# Patient Record
Sex: Male | Born: 1960 | Race: Black or African American | Hispanic: No | Marital: Married | Smoking: Current every day smoker
Health system: Southern US, Community
[De-identification: ages and names within clinical notes are randomized; demographics above are authoritative.]

## PROBLEM LIST (undated history)

## (undated) DIAGNOSIS — K648 Other hemorrhoids: Secondary | ICD-10-CM

## (undated) DIAGNOSIS — I10 Essential (primary) hypertension: Secondary | ICD-10-CM

## (undated) DIAGNOSIS — M4800 Spinal stenosis, site unspecified: Secondary | ICD-10-CM

## (undated) DIAGNOSIS — M542 Cervicalgia: Secondary | ICD-10-CM

## (undated) DIAGNOSIS — D72819 Decreased white blood cell count, unspecified: Secondary | ICD-10-CM

## (undated) DIAGNOSIS — K5790 Diverticulosis of intestine, part unspecified, without perforation or abscess without bleeding: Secondary | ICD-10-CM

## (undated) DIAGNOSIS — N400 Enlarged prostate without lower urinary tract symptoms: Secondary | ICD-10-CM

## (undated) DIAGNOSIS — H269 Unspecified cataract: Secondary | ICD-10-CM

## (undated) DIAGNOSIS — I4891 Unspecified atrial fibrillation: Secondary | ICD-10-CM

## (undated) DIAGNOSIS — K760 Fatty (change of) liver, not elsewhere classified: Secondary | ICD-10-CM

## (undated) DIAGNOSIS — K635 Polyp of colon: Secondary | ICD-10-CM

## (undated) DIAGNOSIS — I499 Cardiac arrhythmia, unspecified: Secondary | ICD-10-CM

## (undated) HISTORY — DX: Cardiac arrhythmia, unspecified: I49.9

## (undated) HISTORY — DX: Polyp of colon: K63.5

## (undated) HISTORY — DX: Unspecified atrial fibrillation: I48.91

## (undated) HISTORY — DX: Diverticulosis of intestine, part unspecified, without perforation or abscess without bleeding: K57.90

## (undated) HISTORY — DX: Decreased white blood cell count, unspecified: D72.819

## (undated) HISTORY — DX: Cervicalgia: M54.2

## (undated) HISTORY — PX: EYE SURGERY: SHX253

## (undated) HISTORY — DX: Benign prostatic hyperplasia without lower urinary tract symptoms: N40.0

## (undated) HISTORY — PX: OTHER SURGICAL HISTORY: SHX169

## (undated) HISTORY — DX: Spinal stenosis, site unspecified: M48.00

## (undated) HISTORY — DX: Unspecified cataract: H26.9

## (undated) HISTORY — DX: Fatty (change of) liver, not elsewhere classified: K76.0

## (undated) HISTORY — DX: Other hemorrhoids: K64.8

## (undated) HISTORY — DX: Essential (primary) hypertension: I10

---

## 1998-06-14 ENCOUNTER — Emergency Department (HOSPITAL_COMMUNITY): Admission: EM | Admit: 1998-06-14 | Discharge: 1998-06-14 | Payer: Self-pay | Admitting: Emergency Medicine

## 1999-12-10 ENCOUNTER — Emergency Department (HOSPITAL_COMMUNITY): Admission: EM | Admit: 1999-12-10 | Discharge: 1999-12-10 | Payer: Self-pay | Admitting: Emergency Medicine

## 1999-12-10 ENCOUNTER — Encounter: Payer: Self-pay | Admitting: Emergency Medicine

## 2000-07-12 ENCOUNTER — Emergency Department (HOSPITAL_COMMUNITY): Admission: EM | Admit: 2000-07-12 | Discharge: 2000-07-12 | Payer: Self-pay | Admitting: Emergency Medicine

## 2002-01-29 ENCOUNTER — Emergency Department (HOSPITAL_COMMUNITY): Admission: EM | Admit: 2002-01-29 | Discharge: 2002-01-29 | Payer: Self-pay | Admitting: Emergency Medicine

## 2002-02-04 ENCOUNTER — Emergency Department (HOSPITAL_COMMUNITY): Admission: EM | Admit: 2002-02-04 | Discharge: 2002-02-04 | Payer: Self-pay | Admitting: Emergency Medicine

## 2004-08-20 ENCOUNTER — Inpatient Hospital Stay (HOSPITAL_COMMUNITY): Admission: EM | Admit: 2004-08-20 | Discharge: 2004-08-21 | Payer: Self-pay | Admitting: *Deleted

## 2005-02-16 ENCOUNTER — Emergency Department (HOSPITAL_COMMUNITY): Admission: EM | Admit: 2005-02-16 | Discharge: 2005-02-16 | Payer: Self-pay | Admitting: Emergency Medicine

## 2005-07-04 ENCOUNTER — Emergency Department (HOSPITAL_COMMUNITY): Admission: EM | Admit: 2005-07-04 | Discharge: 2005-07-04 | Payer: Self-pay | Admitting: *Deleted

## 2005-07-06 ENCOUNTER — Emergency Department (HOSPITAL_COMMUNITY): Admission: EM | Admit: 2005-07-06 | Discharge: 2005-07-06 | Payer: Self-pay | Admitting: Family Medicine

## 2006-05-01 ENCOUNTER — Emergency Department (HOSPITAL_COMMUNITY): Admission: EM | Admit: 2006-05-01 | Discharge: 2006-05-02 | Payer: Self-pay | Admitting: Emergency Medicine

## 2006-05-02 ENCOUNTER — Emergency Department (HOSPITAL_COMMUNITY): Admission: EM | Admit: 2006-05-02 | Discharge: 2006-05-02 | Payer: Self-pay | Admitting: Emergency Medicine

## 2008-12-20 ENCOUNTER — Emergency Department (HOSPITAL_COMMUNITY): Admission: EM | Admit: 2008-12-20 | Discharge: 2008-12-20 | Payer: Self-pay | Admitting: Emergency Medicine

## 2010-02-20 ENCOUNTER — Emergency Department (HOSPITAL_COMMUNITY): Admission: EM | Admit: 2010-02-20 | Discharge: 2010-02-20 | Payer: Self-pay | Admitting: Emergency Medicine

## 2010-02-28 ENCOUNTER — Encounter: Admission: RE | Admit: 2010-02-28 | Discharge: 2010-02-28 | Payer: Self-pay | Admitting: Chiropractic Medicine

## 2010-06-01 ENCOUNTER — Emergency Department (HOSPITAL_COMMUNITY)
Admission: AC | Admit: 2010-06-01 | Discharge: 2010-06-02 | Payer: Self-pay | Source: Home / Self Care | Admitting: Emergency Medicine

## 2010-07-10 ENCOUNTER — Ambulatory Visit: Payer: Self-pay | Admitting: Internal Medicine

## 2010-07-10 ENCOUNTER — Observation Stay (HOSPITAL_COMMUNITY): Admission: EM | Admit: 2010-07-10 | Discharge: 2010-07-11 | Payer: Self-pay | Admitting: Emergency Medicine

## 2010-07-11 ENCOUNTER — Encounter: Payer: Self-pay | Admitting: Internal Medicine

## 2010-07-12 ENCOUNTER — Encounter: Payer: Self-pay | Admitting: Internal Medicine

## 2010-10-30 NOTE — Letter (Signed)
Summary: Patient Notice- Polyp Results  [facility]  [address] [place], [place] [address]   Phone: [phone]  Fax: [phone]        July 12, 2010 MRN: [id number]    Gene Anderson  , [place]      Dear [name],  I am pleased to inform you that the colon polyp(s) removed during your recent colonoscopy was (were) found to be benign (no cancer detected) upon pathologic examination.Your polyp was hyperplastic ( not precancerous)  I recommend you have a repeat colonoscopy examination in 10_ years to look for recurrent polyps, as having colon polyps increases your risk for having recurrent polyps or even colon cancer in the future.  Should you develop new or worsening symptoms of abdominal pain, bowel habit changes or bleeding from the rectum or bowels, please schedule an evaluation with either your primary care physician or with me.  Additional information/recommendations:  _x_ No further action with gastroenterology is needed at this time. Please      follow-up with your primary care physician for your other healthcare      needs.  __ Please call [phone] to schedule a return visit to review your      situation.  __ Please keep your follow-up visit as already scheduled.  __ Continue treatment plan as outlined the day of your exam.  Please call us if you are having persistent problems or have questions about your condition that have not been fully answered at this time.  Sincerely,  Hart Carwin MD  This letter has been electronically signed by your physician.  Appended Document: Patient Notice- Polyp Results Letter mailed to patient.

## 2010-10-30 NOTE — Procedures (Signed)
Summary: Colonoscopy  Patient: Gene Anderson Note: All result statuses are Final unless otherwise noted.  Tests: (1) Colonoscopy (COL)   COL Colonoscopy           DONE     [facility]     [address] [place], [place]  [address]           COLONOSCOPY PROCEDURE REPORT           PATIENT:  Gene Anderson, Gene Anderson  MR#:  [id number]     BIRTHDATE:  03/31/1961, 49 yrs. old  GENDER:  male     ENDOSCOPIST:  Hedwig Morton. Juanda Chance, MD     REF. BY:     PROCEDURE DATE:  07/11/2010     PROCEDURE:  Colonoscopy 33295     ASA CLASS:  Class I     INDICATIONS:  hematochezia pt came to ER with 2 episodes of     painless rectal bleeding. No prior GI history     MEDICATIONS:   Versed 5 mg, Fentanyl 75 mcg           DESCRIPTION OF PROCEDURE:   After the risks benefits and     alternatives of the procedure were thoroughly explained, informed     consent was obtained.  Digital rectal exam was performed and     revealed no rectal masses.   The Pentax Peds Colonoscope [id number]     endoscope was introduced through the anus and advanced to the     cecum, which was identified by both the appendix and ileocecal     valve, without limitations.  The quality of the prep was good,     using MoviPrep.  The instrument was then slowly withdrawn as the     colon was fully examined.     <<PROCEDUREIMAGES>>           FINDINGS:  Internal hemorrhoids were found (see image12, image11,     image10, and image9). bleeding at the dentate line from     hypervascular area  A sessile polyp was found. at 30 cm, 3 mm     sessile polyp The polyp was removed using cold biopsy forceps (see     image2 and image1).  Mild diverticulosis was found (see image7,     image4, and image3). predominanetly right colon diverticuli, only     few left colon diverticuli  This was otherwise a normal     examination of the colon (see image4, image5, and image6).     Retroflexion was not performed.  The scope was then withdrawn from     the  patient and the procedure completed.           COMPLICATIONS:  None     ENDOSCOPIC IMPRESSION:     1) Internal hemorrhoids     2) Sessile polyp     3) Mild diverticulosis     4) Otherwise normal examination     anal source of bleeding, low volume,     no blood in the colon     RECOMMENDATIONS:     1) Await pathology results     Anusol HC supp 1 hAnalpram cream 2.5% apply bid to rectum x 1     week then prn     REPEAT EXAM:  In 5 - 7 year(s) for.  recall 5-7 years depending on     polyp histology           ______________________________  Hedwig Morton. Juanda Chance, MD           CC:           n.     eSIGNED:   Hedwig Morton. Brodie at 07/11/2010 11:16 AM           Yolande Jolly, [id number]  Note: An exclamation mark (!) indicates a result that was not dispersed into the flowsheet. Document Creation Date: 07/11/2010 12:09 PM _______________________________________________________________________  (1) Order result status: Final Collection or observation date-time: 07/11/2010 11:07 Requested date-time:  Receipt date-time:  Reported date-time:  Referring Physician:   Ordering Physician: Lina Sar [phone]) Specimen Source:  Source: Launa Grill Order Number: [id number] Lab site:

## 2010-12-12 LAB — CBC
Hemoglobin: 12.9 g/dL — ABNORMAL LOW (ref 13.0–17.0)
MCH: 32.3 pg (ref 26.0–34.0)
MCH: 32.3 pg (ref 26.0–34.0)
MCV: 92.3 fL (ref 78.0–100.0)
Platelets: 201 10*3/uL (ref 150–400)
Platelets: 230 10*3/uL (ref 150–400)
RBC: 3.98 MIL/uL — ABNORMAL LOW (ref 4.22–5.81)
RBC: 4.4 MIL/uL (ref 4.22–5.81)
RDW: 13.3 % (ref 11.5–15.5)

## 2010-12-12 LAB — COMPREHENSIVE METABOLIC PANEL
ALT: 22 U/L (ref 0–53)
BUN: 13 mg/dL (ref 6–23)
CO2: 31 mEq/L (ref 19–32)
Calcium: 9.4 mg/dL (ref 8.4–10.5)
Creatinine, Ser: 0.83 mg/dL (ref 0.4–1.5)
GFR calc non Af Amer: >60 mL/min (ref >60)
Glucose, Bld: 94 mg/dL (ref 70–99)
Sodium: 139 mEq/L (ref 135–145)
Total Protein: 7.9 g/dL (ref 6.0–8.3)

## 2010-12-12 LAB — DIFFERENTIAL
Basophils Absolute: 0 10*3/uL (ref 0.0–0.1)
Basophils Relative: 1 % (ref 0–1)
Eosinophils Absolute: 0.1 10*3/uL (ref 0.0–0.7)
Eosinophils Relative: 2 % (ref 0–5)
Lymphs Abs: 2.1 10*3/uL (ref 0.7–4.0)
Neutrophils Relative %: 36 % — ABNORMAL LOW (ref 43–77)

## 2010-12-12 LAB — HEMOCCULT GUIAC POC 1CARD (OFFICE): Fecal Occult Bld: POSITIVE

## 2010-12-12 LAB — HEMOGLOBIN AND HEMATOCRIT, BLOOD
HCT: 39.2 % (ref 39.0–52.0)
HCT: 40.6 % (ref 39.0–52.0)
Hemoglobin: 14.1 g/dL (ref 13.0–17.0)

## 2010-12-12 LAB — PROTIME-INR
INR: 1.06 (ref 0.00–1.49)
Prothrombin Time: 12.8 seconds (ref 11.6–15.2)
Prothrombin Time: 14 seconds (ref 11.6–15.2)

## 2010-12-13 LAB — CBC
HCT: 41.4 % (ref 39.0–52.0)
MCHC: 35.7 g/dL (ref 30.0–36.0)
MCV: 91.8 fL (ref 78.0–100.0)
Platelets: 226 10*3/uL (ref 150–400)
RDW: 13.1 % (ref 11.5–15.5)

## 2010-12-13 LAB — PROTIME-INR
INR: 0.97 (ref 0.00–1.49)
Prothrombin Time: 13.1 seconds (ref 11.6–15.2)

## 2010-12-13 LAB — ABO/RH: ABO/RH(D): O POS

## 2010-12-13 LAB — POCT I-STAT, CHEM 8
Calcium, Ion: 1.08 mmol/L — ABNORMAL LOW (ref 1.12–1.32)
HCT: 45 % (ref 39.0–52.0)
Hemoglobin: 15.3 g/dL (ref 13.0–17.0)
Sodium: 143 mEq/L (ref 135–145)
TCO2: 28 mmol/L (ref 0–100)

## 2010-12-13 LAB — COMPREHENSIVE METABOLIC PANEL
Albumin: 4.2 g/dL (ref 3.5–5.2)
BUN: 11 mg/dL (ref 6–23)
Calcium: 8.4 mg/dL (ref 8.4–10.5)
Glucose, Bld: 95 mg/dL (ref 70–99)
Sodium: 142 mEq/L (ref 135–145)
Total Protein: 7.9 g/dL (ref 6.0–8.3)

## 2010-12-13 LAB — POCT CARDIAC MARKERS: Myoglobin, poc: 143 ng/mL (ref 12–200)

## 2010-12-13 LAB — TYPE AND SCREEN
ABO/RH(D): O POS
Antibody Screen: NEGATIVE

## 2011-01-10 LAB — POCT I-STAT, CHEM 8
BUN: 8 mg/dL (ref 6–23)
Calcium, Ion: 1.2 mmol/L (ref 1.12–1.32)
Chloride: 105 mEq/L (ref 96–112)
Creatinine, Ser: 0.9 mg/dL (ref 0.4–1.5)

## 2011-01-10 LAB — DIFFERENTIAL
Basophils Absolute: 0 10*3/uL (ref 0.0–0.1)
Basophils Relative: 1 % (ref 0–1)
Eosinophils Absolute: 0.1 10*3/uL (ref 0.0–0.7)
Eosinophils Relative: 3 % (ref 0–5)
Lymphocytes Relative: 52 % — ABNORMAL HIGH (ref 12–46)
Monocytes Absolute: 0.3 10*3/uL (ref 0.1–1.0)

## 2011-01-10 LAB — CBC
HCT: 42.9 % (ref 39.0–52.0)
Hemoglobin: 14.5 g/dL (ref 13.0–17.0)
MCHC: 33.9 g/dL (ref 30.0–36.0)
MCV: 90.8 fL (ref 78.0–100.0)
RDW: 13.7 % (ref 11.5–15.5)

## 2011-01-10 LAB — POCT CARDIAC MARKERS: Troponin i, poc: <0.05 ng/mL (ref 0.00–0.09)

## 2011-02-15 NOTE — Discharge Summary (Signed)
NAMEWILBURT, Gene Anderson               ACCOUNT NO.:  [id number]   MEDICAL RECORD NO.:  [id number]          PATIENT TYPE:  OBV   LOCATION:  0374                         FACILITY:  [facility]   PHYSICIAN:  Claudette Laws, M.D.  DATE OF BIRTH:  1960/12/07   DATE OF ADMISSION:  08/19/2004  DATE OF DISCHARGE:  08/21/2004                                 DISCHARGE SUMMARY   HISTORY:  This is a 50 year old single black male who was having intercourse  about 11:00 on the day of admission, when he suddenly experienced some  detumescence swelling of his penis and felt a popping sound on the right  side.  He presented to the emergency room, where it was obvious he had a  penile fracture.  We went over treatment options and I recommended we go  right ahead with exploration and repair of the fractured albuginea.  We will  also need to rule out a fractured urethra.  He did void once since he hurt  himself.  The patient otherwise is in good health.   LABORATORY DATA:  His hemoglobin was 14.9, hematocrit 42.9.  Electrolytes  were normal with a BUN of 11 and a creatinine of 0.9.  His white cell count  was 6300.   COURSE IN THE HOSPITAL:  After the being evaluated in the emergency room he  was taken right to surgery and underwent an exploration of the penis.  He  did require two incisions for me to get down to the tear  in the tunica  albuginea.  There was no evidence of any urethral tear.   He did have a considerable amount of swelling and blood loss, at least in  dissection the layers of the penis and scrotum.  However, I felt that we had  successfully repaired the rent in the penis.  I then observed him for two  more nights with a Foley catheter.  By the first day he was up walking  around and ambulating well.  However, I did remove the dressing.  Some of  the penile swelling had resolved, although he did have some swelling of the  scrotum, which I thought was secondary to blood dissecting into the  fascial  planes.  He did have a low grade temperature on the day of discharge but I  thought it was safe to go ahead and remove the Foley catheter, send him on  home, and then watch him as an outpatient.  He was given strict instructions  for bed rest most of the time, other than to get up to go to the bathroom.  He was also instructed in ice pack and elevation of the perineum for the  next several days.  He understands and agrees to the proposed followup.  If  he has any trouble in the interim, he will give [place] a call.   FINAL DIAGNOSIS:  Fractured penis.   OPERATION:  Penile exploration and repair of rupture of the tunica  albuginea.   COMPLICATIONS:  None except for some mild perineal swelling as expected.   CONDITION ON DISCHARGE:  Stable.  DISCHARGE MEDICATIONS:  1.  Cipro 250 mg 1 b.i.d. #14.  2.  Tylox #50 p.r.n. pain.   DISPOSITION:  Regular diet and limited activity.  He is to see me in the  office in one week.     Rona   RFS/MEDQ  D:  08/21/2004  T:  08/21/2004  Job:  [id number]

## 2011-02-15 NOTE — Op Note (Signed)
NAMERALPHEAL, ZAPPONE               ACCOUNT NO.:  [id number]   MEDICAL RECORD NO.:  [id number]          PATIENT TYPE:  OBV   LOCATION:  0101                         FACILITY:  [facility]   PHYSICIAN:  Claudette Laws, M.D.  DATE OF BIRTH:  Mar 21, 1961   DATE OF PROCEDURE:  08/19/2004  DATE OF DISCHARGE:                                 OPERATIVE REPORT   PREOPERATIVE DIAGNOSIS:  Fractured penis.   POSTOPERATIVE DIAGNOSIS:  Fractured penis.   OPERATION:  Flexible cystoscopy and repair of torn tunica albuginea (2  incisions).   SURGEON:  Claudette Laws, M.D.   HISTORY:  this patient sustained a fractured penis during vaginal  intercourse earlier in the day.  The details and indications for surgery  were outlined in the history and physical.  The patient comes in now for  formal repair and cystoscopy.   DESCRIPTION OF PROCEDURE:  In the supine position under intubated general  anesthesia, cystoscopy was performed with a flexible cystoscope.  He had a  normal anterior urethra and no obvious fracture or torn urethra.  Everything  looked fine up into the prostate.  I therefore went ahead and inserted a 16  French Foley catheter, obtained grossly clear urine.  This was then plugged.  Our attention was then turned to the penis where a circumcising incision was  made about 1 cm below the corona.  A degloving of the penis was then  performed, and it was apparent that the fracture was on the right shaft,  almost to the penile scrotal junction.  He had a considerable amount of  edema.  Although I could feel the rent in the tunica, I could not get down  to it from the degloving incision, and so a __________ incision was made at  the base of the penis in horizontal fashion.  Dissection again was carried  down through hematoma in the various penile layers.  All bleeders were  either electrocoagulated or suture ligated with 2-0 chromic.  At this point,  exposure afforded me the ability now to identify  the torn tunica.  I put in  approximately six 2-0 Vicryl sutures on a CT1 needle.  This seemed to close  up the tear nicely.  The incisions were irrigated with copious amounts of  saline.  I then closed the shaft incision with interrupted 2-0 Vicryl.  We  then approximated the shaft skin back to a few millimeters of mucosa with  interrupted 2-0 chromic sutures.  Xeroform dressing was applied along with  fluff dressing, Coban dressing.  Catheter was hooked to straight drain.  He  was then back to the recovery room in satisfactory condition.     Rona   RFS/MEDQ  D:  08/19/2004  T:  08/19/2004  Job:  [id number]

## 2011-02-15 NOTE — H&P (Signed)
Gene Anderson, Gene Anderson               ACCOUNT NO.:  [id number]   MEDICAL RECORD NO.:  [id number]          PATIENT TYPE:  OBV   LOCATION:  0101                         FACILITY:  [facility]   PHYSICIAN:  Claudette Laws, M.D.  DATE OF BIRTH:  1961-01-29   DATE OF ADMISSION:  08/19/2004  DATE OF DISCHARGE:                                HISTORY & PHYSICAL   CHIEF COMPLAINT:  Swollen penis.   PRESENT ILLNESS:  This 50 year old single black male was having intercourse  about 11 a.m. this morning when suddenly he felt a pop in his penis on the  right side, along with tumescence, and immediate swelling.  He has had  minimal pain.  He only voided x1 since then.  The patient presented in the  emergency room.  I was called in to see him, and it was obvious that he had  a fractured penis.  He had considerable edema of the shaft of his penis,  with some extension into the upper scrotum.  Normal meatus.  I explained the  situation to the patient, and told him that normally the best results are  obtained with immediate exploration of the penis with repair of a torn  tunica albuginea.  We also need to rule out a fractured urethra.  The  patient was told that, in any event, even with a good repair, he still may  be impotent, but long-term results seem to be better with immediate repair  and exploration and evacuation of the hematoma.  He understands and agrees  to the proposed surgery.  Incidentally, when he did void, the urine was  grossly clear.   PAST MEDICAL HISTORY:   DRUG ALLERGIES:  Denies any.   MEDICATIONS:  Vitamins.   OPERATIONS:  Denies any.   SOCIAL HISTORY:  He is single.  Works as a Sales promotion account executive for [employer].   REVIEW OF SYSTEMS:  Otherwise unremarkable.  He is basically in good health.  He sees a physician in [facility] for primary care.  He lives in [place].   PHYSICAL EXAMINATION:  GENERAL:  In no acute distress.  VITAL SIGNS:  Blood pressure is 132/77, pulse 108,  respirations 20,  temperature 99.4.  HEENT:  Unremarkable.  No nodes.  CHEST:  Clear to auscultation and percussion.  No rales.  HEART:  Regular rhythm.  No obvious murmurs.  ABDOMEN:  Benign, nontender.  No obvious organomegaly, hernias, or  lymphadenopathy.  GENITALIA:  As described above, a markedly edematous shaft of the penis.  The hematoma extends into the upper base of the scrotum.  Normal urethral  meatus.  No obvious blood.  RECTAL:  Deferred, and not felt to be indicated.  EXTREMITIES:  Otherwise normal.   IMPRESSION:  Fractured penis following vaginal intercourse.  Rule out  urethral rupture, as well.     Rona   RFS/MEDQ  D:  08/19/2004  T:  08/19/2004  Job:  [id number]

## 2011-03-31 ENCOUNTER — Emergency Department (HOSPITAL_COMMUNITY)
Admission: EM | Admit: 2011-03-31 | Discharge: 2011-03-31 | Disposition: A | Discharge status: Home / Self Care | Payer: Self-pay | Attending: Emergency Medicine | Admitting: Emergency Medicine

## 2011-03-31 DIAGNOSIS — M545 Low back pain, unspecified: Secondary | ICD-10-CM | POA: Insufficient documentation

## 2011-03-31 DIAGNOSIS — M79609 Pain in unspecified limb: Secondary | ICD-10-CM | POA: Insufficient documentation

## 2011-03-31 DIAGNOSIS — G8929 Other chronic pain: Secondary | ICD-10-CM | POA: Insufficient documentation

## 2011-03-31 DIAGNOSIS — R319 Hematuria, unspecified: Secondary | ICD-10-CM | POA: Insufficient documentation

## 2011-03-31 DIAGNOSIS — IMO0001 Reserved for inherently not codable concepts without codable children: Secondary | ICD-10-CM | POA: Insufficient documentation

## 2011-03-31 DIAGNOSIS — M25519 Pain in unspecified shoulder: Secondary | ICD-10-CM | POA: Insufficient documentation

## 2011-03-31 LAB — URINALYSIS, ROUTINE W REFLEX MICROSCOPIC
Glucose, UA: NEGATIVE mg/dL
Ketones, ur: NEGATIVE mg/dL
pH: 5.5 (ref 5.0–8.0)

## 2011-03-31 LAB — URINE MICROSCOPIC-ADD ON

## 2011-08-18 ENCOUNTER — Emergency Department (HOSPITAL_COMMUNITY): Payer: Self-pay

## 2011-08-18 ENCOUNTER — Emergency Department (HOSPITAL_COMMUNITY)
Admission: EM | Admit: 2011-08-18 | Discharge: 2011-08-19 | Disposition: A | Discharge status: Home / Self Care | Payer: Self-pay | Attending: Emergency Medicine | Admitting: Emergency Medicine

## 2011-08-18 DIAGNOSIS — R51 Headache: Secondary | ICD-10-CM | POA: Insufficient documentation

## 2011-08-18 DIAGNOSIS — K625 Hemorrhage of anus and rectum: Secondary | ICD-10-CM | POA: Insufficient documentation

## 2011-08-18 DIAGNOSIS — R109 Unspecified abdominal pain: Secondary | ICD-10-CM | POA: Insufficient documentation

## 2011-08-18 LAB — DIFFERENTIAL
Basophils Relative: 1 % (ref 0–1)
Eosinophils Absolute: 0.1 10*3/uL (ref 0.0–0.7)
Neutrophils Relative %: 42 % — ABNORMAL LOW (ref 43–77)

## 2011-08-18 LAB — COMPREHENSIVE METABOLIC PANEL
ALT: 17 U/L (ref 0–53)
Albumin: 3.9 g/dL (ref 3.5–5.2)
Alkaline Phosphatase: 74 U/L (ref 39–117)
Potassium: 3.4 mEq/L — ABNORMAL LOW (ref 3.5–5.1)
Sodium: 140 mEq/L (ref 135–145)
Total Protein: 7.4 g/dL (ref 6.0–8.3)

## 2011-08-18 LAB — CBC
MCH: 32 pg (ref 26.0–34.0)
MCHC: 34.3 g/dL (ref 30.0–36.0)
Platelets: 190 10*3/uL (ref 150–400)

## 2011-08-18 MED ORDER — HYDROCODONE-ACETAMINOPHEN 5-325 MG PO TABS: 1.0000 | ORAL_TABLET | Freq: Four times a day (QID) | ORAL | Status: AC | PRN

## 2011-08-18 NOTE — ED Notes (Signed)
Pt reports having colonoscopy in April 2012 for blood in stool, pt had a polyp removed at that time - pt states last Monday he began noting blood in his stool, pt states blood is not bright or dark in color. Pt denies blood clots. Pt denies any abd or rectal pain, nausea or vomiting. Pt states blood in stool stopped on Thursday and has not noted any since. Pt here tonight for continued HA that also began on Monday and has gotten progressively worse, HA is generalized and throbbing in nature, pt has taken tylenol at home w/o relief. Pt denies any blurred vision, amnesia weakness, or slurred speech.

## 2011-08-18 NOTE — ED Provider Notes (Signed)
History     CSN: [id number] Arrival date & time: 08/18/2011  6:28 PM   First MD Initiated Contact with Patient 08/18/11 2145      Chief Complaint  Patient presents with  . Rectal Bleeding    pt states rectal bleeding since friday denies n/v denies dizziness pt c/o headache since friday pt states blood mixed in stool friday and saturday denies blood with bm today     (Consider location/radiation/quality/duration/timing/severity/associated sxs/prior treatment) Patient is a 50 y.o. male presenting with hematochezia. The history is provided by the patient (pt complains of rectal bleeding).  Rectal Bleeding  The current episode started 5 to 7 days ago. The problem occurs frequently. The problem has been unchanged. The pain is mild. The stool is described as liquid. Associated symptoms include headaches. Pertinent negatives include no anorexia, no fever, no abdominal pain, no diarrhea, no hemorrhoids, no hematuria, no chest pain, no coughing and no rash. The vomiting occurs rarely. He has been behaving normally. He has been eating and drinking normally. Urine output has been normal.    History reviewed. No pertinent past medical history.  History reviewed. No pertinent past surgical history.  History reviewed. No pertinent family history.  History  Substance Use Topics  . Smoking status: Current Some Day Smoker  . Smokeless tobacco: Not on file  . Alcohol Use: Yes      Review of Systems  Constitutional: Negative for fever and fatigue.  HENT: Negative for congestion, sinus pressure and ear discharge.   Eyes: Negative for discharge.  Respiratory: Negative for cough.   Cardiovascular: Negative for chest pain.  Gastrointestinal: Positive for hematochezia. Negative for abdominal pain, diarrhea, anorexia and hemorrhoids.       Rectal bleeding  Genitourinary: Negative for frequency and hematuria.  Musculoskeletal: Negative for back pain.  Skin: Negative for rash.  Neurological:  Positive for headaches. Negative for seizures.  Hematological: Negative.   Psychiatric/Behavioral: Negative for hallucinations.    Allergies  Review of patient's allergies indicates no known allergies.  Home Medications   Current Outpatient Rx  Name Route Sig Dispense Refill  . ACETAMINOPHEN 500 MG PO TABS Oral Take 1,000 mg by mouth every 6 (six) hours as needed. Pain     . HYDROCODONE-ACETAMINOPHEN 5-325 MG PO TABS Oral Take 1 tablet by mouth every 6 (six) hours as needed for pain. 20 tablet 0    BP 115/67  Pulse 68  Temp(Src) 98.4 F (36.9 C) (Oral)  Resp 18  SpO2 98%  Physical Exam  Constitutional: He is oriented to person, place, and time. He appears well-developed.  HENT:  Head: Normocephalic and atraumatic.  Eyes: Conjunctivae and EOM are normal. No scleral icterus.  Neck: Neck supple. No thyromegaly present.  Cardiovascular: Normal rate and regular rhythm.  Exam reveals no gallop and no friction rub.   No murmur heard. Pulmonary/Chest: No stridor. He has no wheezes. He has no rales. He exhibits no tenderness.  Abdominal: He exhibits no distension. There is no tenderness. There is no rebound.  Genitourinary:       Rectal exam normal  Musculoskeletal: Normal range of motion. He exhibits no edema.  Lymphadenopathy:    He has no cervical adenopathy.  Neurological: He is oriented to person, place, and time. Coordination normal.  Skin: No rash noted. No erythema.  Psychiatric: He has a normal mood and affect. His behavior is normal.    ED Course  Procedures (including critical care time)  Labs Reviewed  DIFFERENTIAL - Abnormal; Notable  for the following:    Neutrophils Relative 42 (*)    All other components within normal limits  COMPREHENSIVE METABOLIC PANEL - Abnormal; Notable for the following:    Potassium 3.4 (*)    Glucose, Bld 120 (*)    Total Bilirubin 0.2 (*)    All other components within normal limits  CBC   Dg Abd Acute W/chest  08/18/2011   *RADIOLOGY REPORT*  Clinical Data: Rectal bleeding.  Abdominal pain.  ACUTE ABDOMEN SERIES (ABDOMEN 2 VIEW & CHEST 1 VIEW) 08/18/2011:  Comparison: CT chest, abdomen and pelvis 06/02/2010.  Findings: Bowel gas pattern unremarkable without evidence of obstruction or significant ileus.  No evidence of free air or significant air fluid levels on the erect image.  Expected amount of stool throughout normal caliber colon.  No visible opaque urinary tract calculi.  Visible psoas margins.  Regional skeleton unremarkable.  Cardiac silhouette normal in size.  Thoracic aorta mildly tortuous. Hilar and mediastinal contours otherwise unremarkable.  Lungs clear.  No pleural effusions.  IMPRESSION: No acute abdominal or pulmonary abnormalities.  Original Report Authenticated By: Arnell Sieving, M.D.     1. Rectal bleeding       MDM  Rectal bleeding possible hemorhoids        Benny Lennert, MD 08/18/11 [phone]

## 2011-08-18 NOTE — ED Notes (Signed)
Rx given x1 D/c instructions reviewed w/ pt and family - pt and family deny any further questions or concerns at present.  

## 2011-09-11 ENCOUNTER — Encounter (HOSPITAL_COMMUNITY): Payer: Self-pay

## 2011-09-11 ENCOUNTER — Emergency Department (INDEPENDENT_AMBULATORY_CARE_PROVIDER_SITE_OTHER): Payer: Self-pay

## 2011-09-11 ENCOUNTER — Emergency Department (INDEPENDENT_AMBULATORY_CARE_PROVIDER_SITE_OTHER)
Admission: EM | Admit: 2011-09-11 | Discharge: 2011-09-11 | Disposition: A | Discharge status: Home / Self Care | Payer: Self-pay | Source: Home / Self Care | Attending: Family Medicine | Admitting: Family Medicine

## 2011-09-11 DIAGNOSIS — S62339A Displaced fracture of neck of unspecified metacarpal bone, initial encounter for closed fracture: Secondary | ICD-10-CM

## 2011-09-11 DIAGNOSIS — S62309A Unspecified fracture of unspecified metacarpal bone, initial encounter for closed fracture: Secondary | ICD-10-CM

## 2011-09-11 MED ORDER — HYDROCODONE-ACETAMINOPHEN 5-325 MG PO TABS: ORAL_TABLET | ORAL | Status: DC

## 2011-09-11 NOTE — ED Notes (Signed)
Pt states he punched a door with rt fist 2 weeks ago.  C/o pain and swelling rt hand, unable to extend rt 5th finger.

## 2011-09-11 NOTE — ED Notes (Signed)
Swelling noted to rt hand, unable to extend rt 5th finger.

## 2011-09-11 NOTE — ED Provider Notes (Signed)
History     CSN: [id number] Arrival date & time: 09/11/2011 10:15 AM   First MD Initiated Contact with Patient 09/11/11 1010      Chief Complaint  Patient presents with  . Hand Injury    (Consider location/radiation/quality/duration/timing/severity/associated sxs/prior treatment) HPI Comments: Gene Anderson presents for evaluation of pain and deformity in his RIGHT hand after punching a wall over a week ago; he thinks 1.5 to 2 weeks ago.   Patient is a 50 y.o. male presenting with hand injury. The history is provided by the patient.  Hand Injury  The incident occurred more than 1 week ago. The incident occurred at home. The injury mechanism was a direct blow. The pain is present in the right hand. The quality of the pain is described as throbbing. The pain is moderate.    History reviewed. No pertinent past medical history.  History reviewed. No pertinent past surgical history.  No family history on file.  History  Substance Use Topics  . Smoking status: Current Some Day Smoker -- 1.0 packs/day  . Smokeless tobacco: Not on file  . Alcohol Use: Yes     social      Review of Systems  Constitutional: Negative.   HENT: Negative.   Eyes: Negative.   Respiratory: Negative.   Cardiovascular: Negative.   Gastrointestinal: Negative.   Genitourinary: Negative.   Musculoskeletal:       Pain and deformity on dorsum of RIGHT hand, over 5th metacarpal  Skin: Negative.   Neurological: Negative.     Allergies  Review of patient's allergies indicates no known allergies.  Home Medications   Current Outpatient Rx  Name Route Sig Dispense Refill  . ACETAMINOPHEN 500 MG PO TABS Oral Take 1,000 mg by mouth every 6 (six) hours as needed. Pain     . HYDROCODONE-ACETAMINOPHEN 5-325 MG PO TABS  Take one to two tablets every 4 to 6 hours as needed for pain 20 tablet 0    BP 135/79  Pulse 77  Temp(Src) 97.7 F (36.5 C) (Oral)  Resp 16  SpO2 97%  Physical Exam  Nursing note and  vitals reviewed. Constitutional: He is oriented to person, place, and time. He appears well-developed and well-nourished.  HENT:  Head: Normocephalic and atraumatic.  Eyes: EOM are normal.  Neck: Normal range of motion.  Pulmonary/Chest: Effort normal.  Musculoskeletal:       Right hand: He exhibits decreased range of motion, tenderness, bony tenderness, deformity and swelling.       Hands: Neurological: He is alert and oriented to person, place, and time. He has normal strength. No sensory deficit.  Skin: Skin is warm and dry.  Psychiatric: His behavior is normal.    ED Course  Procedures (including critical care time)  Labs Reviewed - No data to display Dg Hand Complete Right  09/11/2011  *RADIOLOGY REPORT*  Clinical Data: Trauma 1 week ago.  RIGHT HAND - COMPLETE 3+ VIEW  Comparison: 02/20/2010.  Findings: Slightly displaced fracture of the right fifth metacarpal distal shaft.  Degenerative changes distal radial ulnar articulation and the carpal region.  IMPRESSION: Right fifth metacarpal fracture.  Original Report Authenticated By: Fuller Canada, M.D.     1. Boxer's fracture       MDM  Xray reviewed       Richardo Priest, MD 09/11/11 1114

## 2011-09-21 ENCOUNTER — Emergency Department (HOSPITAL_COMMUNITY)
Admission: EM | Admit: 2011-09-21 | Discharge: 2011-09-21 | Disposition: A | Discharge status: Home / Self Care | Payer: Self-pay | Attending: Emergency Medicine | Admitting: Emergency Medicine

## 2011-09-21 ENCOUNTER — Encounter (HOSPITAL_COMMUNITY): Payer: Self-pay

## 2011-09-21 DIAGNOSIS — S62309A Unspecified fracture of unspecified metacarpal bone, initial encounter for closed fracture: Secondary | ICD-10-CM | POA: Insufficient documentation

## 2011-09-21 DIAGNOSIS — F17200 Nicotine dependence, unspecified, uncomplicated: Secondary | ICD-10-CM | POA: Insufficient documentation

## 2011-09-21 DIAGNOSIS — W2209XA Striking against other stationary object, initial encounter: Secondary | ICD-10-CM | POA: Insufficient documentation

## 2011-09-21 DIAGNOSIS — S62308A Unspecified fracture of other metacarpal bone, initial encounter for closed fracture: Secondary | ICD-10-CM

## 2011-09-21 DIAGNOSIS — M79609 Pain in unspecified limb: Secondary | ICD-10-CM | POA: Insufficient documentation

## 2011-09-21 MED ORDER — NAPROXEN 375 MG PO TABS: 375.0000 mg | ORAL_TABLET | Freq: Two times a day (BID) | ORAL | Status: AC

## 2011-09-21 MED ORDER — HYDROCODONE-ACETAMINOPHEN 5-325 MG PO TABS: ORAL_TABLET | ORAL | Status: AC

## 2011-09-21 NOTE — ED Notes (Signed)
Ortho Tech called regarding splint application, will monitor.
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
## 2011-09-21 NOTE — ED Notes (Signed)
Pt reports that two weeks ago he went to Urgent Care and they told him his "right hand was broken" by the 5th digit.  He has had increasing swelling and pain at the site.

## 2011-09-21 NOTE — ED Provider Notes (Signed)
Medical screening examination/treatment/procedure(s) were performed by non-physician practitioner and as supervising physician I was immediately available for consultation/collaboration.   Lyanne Co, MD 09/21/11 [phone]

## 2013-02-16 ENCOUNTER — Other Ambulatory Visit: Payer: Self-pay | Admitting: Family Medicine

## 2013-02-16 ENCOUNTER — Ambulatory Visit (INDEPENDENT_AMBULATORY_CARE_PROVIDER_SITE_OTHER): Payer: BC Managed Care – PPO | Admitting: Family Medicine

## 2013-02-16 VITALS — BP 122/78 | HR 93 | Temp 98.9°F | Resp 17 | Ht 74.0 in | Wt 220.0 lb

## 2013-02-16 DIAGNOSIS — D72819 Decreased white blood cell count, unspecified: Secondary | ICD-10-CM | POA: Insufficient documentation

## 2013-02-16 NOTE — Patient Instructions (Addendum)
Thank you for coming in today. We will obtain labs today. I will call you if labs are abnormal. I would like you to establish with a primary care Dr. in the 104 office.  Please followup with a doctor there in one month.  Call or go to the emergency room if you get worse, have trouble breathing, have chest pains, or palpitations.

## 2013-02-16 NOTE — Progress Notes (Addendum)
Gene Anderson is a 52 y.o. male who presents to [facility] today for leukopenia.  Patient was recently seen by his orthopedic Dr. for chronic multi-joint aches and pains. As part of the workup a rheumatologic workup was obtained. The rheumatologic workup was essentially normal, however the patient was found to have mild leukopenia. The patient cannot recall the exact specific lab value. His orthopedic Dr. asked him to present to this office to follow this lab result up and to establish care with a primary care provider. He feels well aside from his aches and pains. He denies any fevers chills weight loss or night sweats.  He takes ibuprofen occasionally for pain.    PMH: Reviewed as noted above otherwise healthy History  Substance Use Topics  . Smoking status: Current Every Day Smoker -- 1.00 packs/day for 36 years    Types: Cigarettes  . Smokeless tobacco: Not on file  . Alcohol Use: Yes     Comment: social   ROS as above  Medications reviewed. Current Outpatient Prescriptions  Medication Sig Dispense Refill  . ibuprofen (ADVIL,MOTRIN) 200 MG tablet Take 600 mg by mouth every 6 (six) hours as needed.        Bertram Gala Glycol-Propyl Glycol (SYSTANE) 0.4-0.3 % SOLN Place 2 drops into both eyes daily as needed.         No current facility-administered medications for this visit.    Exam:  BP 122/78  Pulse 93  Temp(Src) 98.9 F (37.2 C) (Oral)  Resp 17  Ht 6\' 2"  (1.88 m)  Wt 220 lb (99.791 kg)  BMI 28.23 kg/m2  SpO2 98% Gen: Well NAD HEENT: EOMI,  MMM Lungs: CTABL Nl WOB Heart: RRR no MRG Abd: NABS, NT, ND Exts: Non edematous BL  LE, warm and well perfused.   Review of labs shows mild leukopenia several times in the last several years.  Assessment and Plan: 52 y.o. male with probable mild leukopenia. We'll further workup today with a CBC with differential, and cover his metabolic panel. Patient will followup in one month to establish primary care. He will followup sooner if his  labs are abnormal is we will call him. Discussed warning signs or symptoms. Please see discharge instructions. Patient expresses understanding.  I have reviewed and agree with documentation. Robert P. Merla Riches, M.D.

## 2013-02-17 LAB — CBC WITH DIFFERENTIAL/PLATELET
Basophils Absolute: 0 10*3/uL (ref 0.0–0.1)
Eosinophils Relative: 3 % (ref 0–5)
HCT: 40.8 % (ref 39.0–52.0)
Lymphocytes Relative: 47 % — ABNORMAL HIGH (ref 12–46)
MCHC: 35.3 g/dL (ref 30.0–36.0)
MCV: 89.5 fL (ref 78.0–100.0)
Monocytes Absolute: 0.6 10*3/uL (ref 0.1–1.0)
Monocytes Relative: 13 % — ABNORMAL HIGH (ref 3–12)
RDW: 14 % (ref 11.5–15.5)
WBC: 4.6 10*3/uL (ref 4.0–10.5)

## 2013-02-17 LAB — COMPLETE METABOLIC PANEL WITH GFR
AST: 19 U/L (ref 0–37)
BUN: 12 mg/dL (ref 6–23)
CO2: 28 mEq/L (ref 19–32)
Calcium: 9.6 mg/dL (ref 8.4–10.5)
Chloride: 103 mEq/L (ref 96–112)
Creat: 0.91 mg/dL (ref 0.50–1.35)
GFR, Est African American: >89 mL/min
GFR, Est Non African American: >89 mL/min
Glucose, Bld: 95 mg/dL (ref 70–99)

## 2013-02-20 ENCOUNTER — Telehealth: Payer: Self-pay

## 2013-02-20 NOTE — Telephone Encounter (Signed)
Spoke with Sirena at [facility]. Tests added.

## 2013-02-20 NOTE — Telephone Encounter (Signed)
Spoke with pt advised message. Pt understood. 

## 2013-02-20 NOTE — Telephone Encounter (Signed)
CMP was unremarkable. As part of the CBC done by Dr. Denyse Amass the patient's lymphocytes are elevated. It is unclear at this point as to the role this plays, if any in his diagnosis. I see in the note he was seen by his orthopedic for chronic joint aches and pains. I am going to add on a mono and CMV test. We will call with the results once we get them. They can take several days.

## 2013-02-20 NOTE — Telephone Encounter (Signed)
Please review pts lab results. Pt called today

## 2013-02-23 LAB — CMV ABS, IGG+IGM (CYTOMEGALOVIRUS)
CMV IgM: <8 AU/mL (ref <30.00)
Cytomegalovirus Ab-IgG: 1.1 U/mL — ABNORMAL HIGH (ref <0.60)

## 2013-03-02 ENCOUNTER — Telehealth: Payer: Self-pay

## 2013-03-02 NOTE — Telephone Encounter (Signed)
Pt would like a call back from Dr. Denyse Amass.  Says he does not understand his chart.  He would not go into any details.  [phone]

## 2013-03-03 NOTE — Telephone Encounter (Signed)
Called patient. He is asking about the mono tests. He is advised these indicate a past mono infection, but not a current infection. (Dr Clelia Croft reviewed these for me)

## 2013-06-20 ENCOUNTER — Ambulatory Visit (HOSPITAL_COMMUNITY)
Admission: RE | Admit: 2013-06-20 | Discharge: 2013-06-20 | Disposition: A | Discharge status: Home / Self Care | Payer: BC Managed Care – PPO | Source: Ambulatory Visit | Attending: Family Medicine | Admitting: Family Medicine

## 2013-06-20 ENCOUNTER — Encounter (HOSPITAL_COMMUNITY): Payer: Self-pay

## 2013-06-20 ENCOUNTER — Ambulatory Visit (INDEPENDENT_AMBULATORY_CARE_PROVIDER_SITE_OTHER): Payer: BC Managed Care – PPO | Admitting: Family Medicine

## 2013-06-20 VITALS — BP 142/90 | HR 67 | Temp 98.0°F | Resp 16 | Ht 73.5 in | Wt 225.8 lb

## 2013-06-20 DIAGNOSIS — K760 Fatty (change of) liver, not elsewhere classified: Secondary | ICD-10-CM | POA: Insufficient documentation

## 2013-06-20 DIAGNOSIS — K7689 Other specified diseases of liver: Secondary | ICD-10-CM | POA: Insufficient documentation

## 2013-06-20 DIAGNOSIS — K6289 Other specified diseases of anus and rectum: Secondary | ICD-10-CM | POA: Insufficient documentation

## 2013-06-20 DIAGNOSIS — N3289 Other specified disorders of bladder: Secondary | ICD-10-CM | POA: Insufficient documentation

## 2013-06-20 DIAGNOSIS — G8929 Other chronic pain: Secondary | ICD-10-CM

## 2013-06-20 DIAGNOSIS — K5289 Other specified noninfective gastroenteritis and colitis: Secondary | ICD-10-CM | POA: Insufficient documentation

## 2013-06-20 DIAGNOSIS — R1084 Generalized abdominal pain: Secondary | ICD-10-CM

## 2013-06-20 DIAGNOSIS — R197 Diarrhea, unspecified: Secondary | ICD-10-CM | POA: Insufficient documentation

## 2013-06-20 DIAGNOSIS — K921 Melena: Secondary | ICD-10-CM

## 2013-06-20 DIAGNOSIS — N281 Cyst of kidney, acquired: Secondary | ICD-10-CM | POA: Insufficient documentation

## 2013-06-20 DIAGNOSIS — N400 Enlarged prostate without lower urinary tract symptoms: Secondary | ICD-10-CM | POA: Insufficient documentation

## 2013-06-20 DIAGNOSIS — K529 Noninfective gastroenteritis and colitis, unspecified: Secondary | ICD-10-CM

## 2013-06-20 DIAGNOSIS — R1110 Vomiting, unspecified: Secondary | ICD-10-CM

## 2013-06-20 DIAGNOSIS — N200 Calculus of kidney: Secondary | ICD-10-CM | POA: Insufficient documentation

## 2013-06-20 DIAGNOSIS — R109 Unspecified abdominal pain: Secondary | ICD-10-CM | POA: Insufficient documentation

## 2013-06-20 HISTORY — DX: Fatty (change of) liver, not elsewhere classified: K76.0

## 2013-06-20 LAB — POCT CBC
Lymph, poc: 1.7 (ref 0.6–3.4)
MCH, POC: 32 pg — AB (ref 27–31.2)
MCHC: 31.8 g/dL (ref 31.8–35.4)
MID (cbc): 0.4 (ref 0–0.9)
MPV: 8.6 fL (ref 0–99.8)
POC Granulocyte: 1.8 — AB (ref 2–6.9)
POC LYMPH PERCENT: 43 %L (ref 10–50)
POC MID %: 10.7 %M (ref 0–12)
RDW, POC: 14 %
WBC: 3.9 10*3/uL — AB (ref 4.6–10.2)

## 2013-06-20 LAB — COMPREHENSIVE METABOLIC PANEL
ALT: 26 U/L (ref 0–53)
Alkaline Phosphatase: 70 U/L (ref 39–117)
Creat: 0.91 mg/dL (ref 0.50–1.35)
Sodium: 141 mEq/L (ref 135–145)
Total Bilirubin: 0.4 mg/dL (ref 0.3–1.2)
Total Protein: 8.1 g/dL (ref 6.0–8.3)

## 2013-06-20 MED ORDER — CIPROFLOXACIN HCL 500 MG PO TABS: 500.0000 mg | ORAL_TABLET | Freq: Two times a day (BID) | ORAL | Status: DC

## 2013-06-20 MED ORDER — METRONIDAZOLE 500 MG PO TABS: 500.0000 mg | ORAL_TABLET | Freq: Three times a day (TID) | ORAL | Status: DC

## 2013-06-20 MED ORDER — IOHEXOL 300 MG/ML  SOLN
100.0000 mL | Freq: Once | INTRAMUSCULAR | Status: AC | PRN
  Administered 2013-06-20: 100 mL via INTRAVENOUS

## 2013-06-20 NOTE — Patient Instructions (Addendum)
1.  PRESENT TO [facility] ADMITTING FOR CT ABDOMEN/PELVIS.

## 2013-06-20 NOTE — Progress Notes (Signed)
[address]   [place], [place]  [address]   [phone]  Subjective:    Patient ID: Gene Anderson, male    DOB: 1961/03/21, 52 y.o.   MRN: [id number]  HPI This 52 y.o. male presents for evaluation of diarrhea, abdominal pain, vomiting.  Onset two days ago.  No fever; +chills; no sweats.  Diarrhea multiple; bloody stools last night; no blood this morning.  Darker stools; +black stools.  Intermittent pain; duration 15 seconds; sharp stabbing pains.  Vomiting this morning; x 1; orange juice in color.  No recent travel; no camping.  Pain was first symptom.  No recent antibiotics.  Colonoscopy two years ago due to colon cancer screening; few polyps.  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   Review of Systems  Constitutional: Positive for chills and fatigue. Negative for fever and diaphoresis.  Gastrointestinal: Positive for nausea, vomiting, abdominal pain, diarrhea, blood in stool and abdominal distention. Negative for constipation, anal bleeding and rectal pain.  Neurological: Negative for dizziness, light-headedness and headaches.  Hematological: Negative for adenopathy. Does not bruise/bleed easily.   Past Medical History  Diagnosis Date  . Spinal stenosis   . Prostate enlargement   . Fatty liver 06/20/13  . Internal hemorrhoids   . Diverticulosis   . Hyperplastic colon polyp    Past Surgical History  Procedure Laterality Date  . Flexible cystoscopy and repair of torn tunica albuginea     No Known Allergies Current Outpatient Prescriptions on File Prior to Visit  Medication Sig Dispense Refill  . ibuprofen (ADVIL,MOTRIN) 200 MG tablet Take 600 mg by mouth every 6 (six) hours as needed.        Bertram Gala Glycol-Propyl Glycol (SYSTANE) 0.4-0.3 % SOLN Place 2 drops into both eyes daily as needed.         No current facility-administered medications on file prior to visit.   History   Social History  . Marital Status: Single    Spouse Name: N/A    Number of Children: N/A  . Years  of Education: N/A   Occupational History  . Not on file.   Social History Main Topics  . Smoking status: Current Every Day Smoker -- 1.00 packs/day for 36 years    Types: Cigarettes  . Smokeless tobacco: Not on file  . Alcohol Use: Yes     Comment: social  . Drug Use: No  . Sexual Activity: Not on file   Other Topics Concern  . Not on file   Social History Narrative  . No narrative on file   Family History  Problem Relation Age of Onset  . Cancer Father   . Stroke Maternal Grandfather   . Cancer Paternal Grandmother          Objective:   Physical Exam  Nursing note and vitals reviewed. Constitutional: He appears well-developed and well-nourished. No distress.  HENT:  Head: Normocephalic and atraumatic.  Mouth/Throat: Oropharynx is clear and moist.  Eyes: Conjunctivae are normal. Pupils are equal, round, and reactive to light.  Neck: Normal range of motion. Neck supple.  Cardiovascular: Normal rate, regular rhythm, normal heart sounds and intact distal pulses.   No murmur heard. Pulmonary/Chest: Effort normal and breath sounds normal.  Abdominal: Soft. Bowel sounds are normal. He exhibits no distension and no mass. There is generalized tenderness. There is no rigidity, no rebound and no guarding. No hernia.  Genitourinary: Rectum normal. Rectal exam shows no fissure, no mass, no tenderness and anal tone normal.  Skin: He is not diaphoretic.    Results for orders placed in visit on 06/20/13  IFOBT (OCCULT BLOOD)      Result Value Range   IFOBT Negative    POCT CBC      Result Value Range   WBC 3.9 (*) 4.6 - 10.2 K/uL   Lymph, poc 1.7  0.6 - 3.4   POC LYMPH PERCENT 43.0  10 - 50 %L   MID (cbc) 0.4  0 - 0.9   POC MID % 10.7  0 - 12 %M   POC Granulocyte 1.8 (*) 2 - 6.9   Granulocyte percent 46.3  37 - 80 %G   RBC 4.40 (*) 4.69 - 6.13 M/uL   Hemoglobin 14.1  14.1 - 18.1 g/dL   HCT, POC 82.9  56.2 - 53.7 %   MCV 100.9 (*) 80 - 97 fL   MCH, POC 32.0 (*) 27 -  31.2 pg   MCHC 31.8  31.8 - 35.4 g/dL   RDW, POC 13.0     Platelet Count, POC 226  142 - 424 K/uL   MPV 8.6  0 - 99.8 fL       Assessment & Plan:  Bloody stools - Plan: IFOBT POC (occult bld, rslt in office), POCT CBC, Comprehensive metabolic panel, CT Abdomen Pelvis W Contrast, Ambulatory referral to Gastroenterology  Vomiting - Plan: IFOBT POC (occult bld, rslt in office), POCT CBC, Comprehensive metabolic panel, CT Abdomen Pelvis W Contrast, Ambulatory referral to Gastroenterology  Diarrhea - Plan: IFOBT POC (occult bld, rslt in office), POCT CBC, Comprehensive metabolic panel, CT Abdomen Pelvis W Contrast, Ambulatory referral to Gastroenterology  Abdominal pain, chronic, generalized - Plan: IFOBT POC (occult bld, rslt in office), POCT CBC, Comprehensive metabolic panel, CT Abdomen Pelvis W Contrast, Ambulatory referral to Gastroenterology  Colitis - Plan: Stool culture, Clostridium difficile EIA, Ambulatory referral to Gastroenterology  1.  Gastroenteritis: New. Associated with abdominal pain, bloody stools; obtain CT abd/pelvis to evaluate for diverticulitis +/- perforation.   2. Bloody stools:  New. Associated with vomiting, diarrhea.  Ddx includes infectious etiology such as diverticulitis, bacterial etiology, viral etiology.  Obtain CT abd/pelvis.  If negative, consider stool studies if symptoms persists.  Refer to GI. 3. Abdominal pain Diffuse:  New. Associated with bloody stools, vomiting, and diarrhea.  Benign abdominal exam; obtain CT abd/pelvis to evaluate cause of symptoms further.  ADDENDUM: 4.  Colitis:  New. Detected by CT abd/pelvis; treat empirically with Cipro and Flagyl; obtain stool studies before starting antibiotics.

## 2013-06-27 ENCOUNTER — Telehealth: Payer: Self-pay

## 2013-06-27 NOTE — Telephone Encounter (Signed)
Patient returned lab call. Please call back at [phone]

## 2013-06-27 NOTE — Telephone Encounter (Signed)
PT IS RETURNING OUR CALL FOR LAB RESULTS ° ° ° ° °

## 2013-06-27 NOTE — Telephone Encounter (Signed)
Patient returned call, possible for lab results. Please try pt again @ [phone] If no answer call [phone]

## 2013-06-27 NOTE — Telephone Encounter (Signed)
See labs 

## 2013-06-27 NOTE — Telephone Encounter (Signed)
See labs again-keep attempting to call pt but he's not answering-unable to LM

## 2013-07-09 ENCOUNTER — Encounter: Payer: Self-pay | Admitting: Internal Medicine

## 2013-07-13 ENCOUNTER — Encounter: Payer: Self-pay | Admitting: *Deleted

## 2013-08-05 ENCOUNTER — Other Ambulatory Visit: Payer: Self-pay

## 2013-09-14 ENCOUNTER — Ambulatory Visit: Payer: BC Managed Care – PPO | Admitting: Internal Medicine

## 2013-11-01 ENCOUNTER — Telehealth: Payer: Self-pay | Admitting: Internal Medicine

## 2013-11-01 NOTE — Telephone Encounter (Signed)
Message copied by Oliva Bustard on Mon Nov 01, 2013  9:52 AM ------      Message from: Larina Bras      Created: Tue Sep 14, 2013 12:53 PM                   ----- Message -----         From: Lafayette Dragon, MD         Sent: 09/14/2013  12:21 PM           To: Larina Bras, CMA            Charge no show      ----- Message -----         From: Larina Bras, CMA         Sent: 09/14/2013  10:58 AM           To: Lafayette Dragon, MD            Patient no showed appointment with Dr Olevia Perches on 09/14/13. Dr Olevia Perches, do you want to charge no show fee?       ------

## 2013-12-29 ENCOUNTER — Emergency Department (INDEPENDENT_AMBULATORY_CARE_PROVIDER_SITE_OTHER)
Admission: EM | Admit: 2013-12-29 | Discharge: 2013-12-29 | Disposition: A | Discharge status: Home / Self Care | Payer: BC Managed Care – PPO | Source: Home / Self Care | Attending: Family Medicine | Admitting: Family Medicine

## 2013-12-29 ENCOUNTER — Encounter (HOSPITAL_COMMUNITY): Payer: Self-pay | Admitting: Emergency Medicine

## 2013-12-29 DIAGNOSIS — L0291 Cutaneous abscess, unspecified: Secondary | ICD-10-CM

## 2013-12-29 DIAGNOSIS — L0390 Cellulitis, unspecified: Secondary | ICD-10-CM

## 2013-12-29 MED ORDER — SULFAMETHOXAZOLE-TMP DS 800-160 MG PO TABS: 1.0000 | ORAL_TABLET | Freq: Two times a day (BID) | ORAL | Status: DC

## 2013-12-29 NOTE — ED Notes (Signed)
Pt  Reports        Boil  l  Inner    Thigh           That he  Noticed last  Week                It  Has  Been  Draining        Some         He  Reports          He     Attempted  To  Drain it  Himself         It     Is          painfull  To  The  Touch

## 2013-12-29 NOTE — ED Provider Notes (Signed)
CSN: [id number]     Arrival date & time 12/29/13  1656 History   First MD Initiated Contact with Patient 12/29/13 1844     Chief Complaint  Patient presents with  . Abscess   (Consider location/radiation/quality/duration/timing/severity/associated sxs/prior Treatment) Patient is a 53 y.o. male presenting with abscess. The history is provided by the patient.  Abscess Location:  Ano-genital Ano-genital abscess location:  Groin Size:  2cm Abscess quality: fluctuance, induration, painful, redness and warmth   Abscess quality: not draining   Red streaking: no   Duration:  1 week Progression:  Worsening Pain details:    Quality:  Aching and pressure   Severity:  Severe   Duration:  1 week   Timing:  Constant   Progression:  Worsening Chronicity:  New Context: not diabetes and not immunosuppression   Relieved by:  Nothing Worsened by:  Nothing tried Ineffective treatments:  Warm compresses and draining/squeezing Associated symptoms: no fever   Risk factors: no prior abscess     Past Medical History  Diagnosis Date  . Spinal stenosis   . Prostate enlargement   . Fatty liver 06/20/13  . Internal hemorrhoids   . Diverticulosis   . Hyperplastic colon polyp    Past Surgical History  Procedure Laterality Date  . Flexible cystoscopy and repair of torn tunica albuginea     Family History  Problem Relation Age of Onset  . Cancer Father   . Stroke Maternal Grandfather   . Cancer Paternal Grandmother    History  Substance Use Topics  . Smoking status: Current Every Day Smoker -- 1.00 packs/day for 36 years    Types: Cigarettes  . Smokeless tobacco: Not on file  . Alcohol Use: Yes     Comment: social    Review of Systems  Constitutional: Negative for fever and chills.  Skin:       Abscess L groin    Allergies  Review of patient's allergies indicates no known allergies.  Home Medications   Current Outpatient Rx  Name  Route  Sig  Dispense  Refill  . ciprofloxacin  (CIPRO) 500 MG tablet   Oral   Take 1 tablet (500 mg total) by mouth 2 (two) times daily.   20 tablet   0   . ibuprofen (ADVIL,MOTRIN) 200 MG tablet   Oral   Take 600 mg by mouth every 6 (six) hours as needed.           . metroNIDAZOLE (FLAGYL) 500 MG tablet   Oral   Take 1 tablet (500 mg total) by mouth 3 (three) times daily.   30 tablet   0   . Polyethyl Glycol-Propyl Glycol (SYSTANE) 0.4-0.3 % SOLN   Both Eyes   Place 2 drops into both eyes daily as needed.           . sulfamethoxazole-trimethoprim (BACTRIM DS) 800-160 MG per tablet   Oral   Take 1 tablet by mouth 2 (two) times daily.   20 tablet   0    BP 142/80  Pulse 72  Temp(Src) 98.6 F (37 C) (Oral)  Resp 18  SpO2 100% Physical Exam  Constitutional: He appears well-developed and well-nourished. No distress.  Genitourinary:     Skin: Skin is warm and dry.  See gu exam    ED Course  INCISION AND DRAINAGE Date/Time: 12/29/2013 7:55 PM Performed by: Carvel Getting Authorized by: Carvel Getting Consent: Verbal consent obtained. Risks and benefits: risks, benefits and alternatives were discussed  Consent given by: patient Patient understanding: patient states understanding of the procedure being performed Patient identity confirmed: verbally with patient Type: abscess Body area: anogenital Location details: perineum Anesthesia: local infiltration Local anesthetic: lidocaine 2% without epinephrine and topical anesthetic Anesthetic total: 4 ml Patient sedated: no Scalpel size: 11 Incision type: single straight Complexity: simple Drainage: purulent Drainage amount: copious Wound treatment: wound left open Packing material: 1/4 in iodoform gauze Comments: Abscess culture sent   (including critical care time) Labs Review Labs Reviewed  CULTURE, ROUTINE-ABSCESS   Imaging Review No results found.   MDM   1. Abscess   rx bactrim DS 1 po q12hrs #20. Pt to return in 2 days to have packing  removed and wound checked.      Carvel Getting, NP 12/29/13 2000

## 2013-12-29 NOTE — Discharge Instructions (Signed)
Keep area clean and dry. Finish all antibiotics even if you are feeling better.   Return in 2 days to have wound rechecked.

## 2013-12-30 NOTE — ED Provider Notes (Signed)
Medical screening examination/treatment/procedure(s) were performed by a resident physician or non-physician practitioner and as the supervising physician I was immediately available for consultation/collaboration.  Lynne Leader, MD    Gregor Hams, MD 12/30/13 [phone]

## 2014-01-02 LAB — CULTURE, ROUTINE-ABSCESS: SPECIAL REQUESTS: NORMAL

## 2014-02-16 ENCOUNTER — Ambulatory Visit (INDEPENDENT_AMBULATORY_CARE_PROVIDER_SITE_OTHER): Payer: BC Managed Care – PPO | Admitting: Neurology

## 2014-02-16 ENCOUNTER — Encounter: Payer: Self-pay | Admitting: Neurology

## 2014-02-16 VITALS — BP 134/85 | HR 87 | Ht 73.0 in | Wt 220.0 lb

## 2014-02-16 DIAGNOSIS — R202 Paresthesia of skin: Secondary | ICD-10-CM

## 2014-02-16 DIAGNOSIS — M542 Cervicalgia: Secondary | ICD-10-CM

## 2014-02-16 DIAGNOSIS — R209 Unspecified disturbances of skin sensation: Secondary | ICD-10-CM

## 2014-02-16 HISTORY — DX: Paresthesia of skin: R20.2

## 2014-02-16 NOTE — Progress Notes (Signed)
PATIENT: Gene Anderson DOB: 10-30-1960  HISTORICAL  Gene Anderson is a 53 years old right-handed African American male, referred by her pain management doctor Dr. Greta Doom primary care physician Dr. Everlene Farrier for evaluation of bilateral feet, hand paresthesia, low back pain.  He has chronic diffuse body aching pain, low back pain, has chronic pain management. He is taking hydrocodone previously, now oxycodone 10mg  4 tabs each for pain since 2014,  He complains of bilateral hand paresthesia, and feet since 2012, he used to work as Proofreader, he first noticed left 5th fingers, now involving all five fingers, and recent 2 months, since March 2015, involving his toes, bilateral toes paresthesia,.  He also complains of neck pain, going down to his left arm,   He denies significant weakness, he denies bowel and bladder incontinence   REVIEW OF SYSTEMS: Full 14 system review of systems performed and notable only for numbness  ALLERGIES: No Known Allergies  HOME MEDICATIONS: Current Outpatient Prescriptions on File Prior to Visit  Medication Sig Dispense Refill  . ciprofloxacin (CIPRO) 500 MG tablet Take 1 tablet (500 mg total) by mouth 2 (two) times daily.  20 tablet  0  . ibuprofen (ADVIL,MOTRIN) 200 MG tablet Take 600 mg by mouth every 6 (six) hours as needed.        . metroNIDAZOLE (FLAGYL) 500 MG tablet Take 1 tablet (500 mg total) by mouth 3 (three) times daily.  30 tablet  0  . Polyethyl Glycol-Propyl Glycol (SYSTANE) 0.4-0.3 % SOLN Place 2 drops into both eyes daily as needed.        . sulfamethoxazole-trimethoprim (BACTRIM DS) 800-160 MG per tablet Take 1 tablet by mouth 2 (two) times daily.  20 tablet  0    PAST MEDICAL HISTORY: Past Medical History  Diagnosis Date  . Lumbar Spinal stenosis   . Prostate enlargement   . Fatty liver 06/20/13  . Internal hemorrhoids   . Diverticulosis   . Hyperplastic colon polyp     PAST SURGICAL HISTORY: Past Surgical History    Procedure Laterality Date  . Flexible cystoscopy and repair of torn tunica albuginea      FAMILY HISTORY: Family History  Problem Relation Age of Onset  . Cancer Father   . Stroke Maternal Grandfather   . Cancer Paternal Grandmother    SOCIAL HISTORY:  History   Social History  . Marital Status: Single    Spouse Name: N/A    Number of Children: 4  . Years of Education: 14   Occupational History  . Unemployed since 2013, because of pain    Social History Main Topics  . Smoking status: Current Every Day Smoker -- 1.00 packs/day for 36 years    Types: Cigarettes  . Smokeless tobacco: Never Used  . Alcohol Use: Yes     Comment: social  . Drug Use: No  . Sexual Activity: Not on file   Other Topics Concern  . Not on file   Social History Narrative   Patient lives at home with his girl friend.   Patient is not working at this time.   Right handed.   Education college education.   Caffeine None     PHYSICAL EXAM   Filed Vitals:   02/16/14 1002  BP: 134/85  Pulse: 87  Height: 6\' 1"  (1.854 m)  Weight: 220 lb (99.791 kg)    Not recorded    Body mass index is 29.03 kg/(m^2).   Generalized: In no acute  distress  Neck: Supple, no carotid bruits   Cardiac: Regular rate rhythm  Pulmonary: Clear to auscultation bilaterally  Musculoskeletal: No deformity  Neurological examination  Mentation: Alert oriented to time, place, history taking, and causual conversation  Cranial nerve II-XII: Pupils were equal round reactive to light. Extraocular movements were full.  Visual field were full on confrontational test. Bilateral fundi were sharp.  Facial sensation and strength were normal. Hearing was intact to finger rubbing bilaterally. Uvula tongue midline.  Head turning and shoulder shrug and were normal and symmetric.Tongue protrusion into cheek strength was normal.  Motor: Normal tone, bulk and strength.  Sensory: Intact to fine touch, pinprick, preserved  vibratory sensation, and proprioception at toes.  Coordination: Normal finger to nose, heel-to-shin bilaterally there was no truncal ataxia  Gait: Rising up from seated position without assistance, normal stance, without trunk ataxia, moderate stride, good arm swing, smooth turning, able to perform tiptoe, and heel walking without difficulty.   Romberg signs: Negative  Deep tendon reflexes: Brachioradialis 2/2, biceps 2/2, triceps 2/2, patellar 2/2, Achilles 2/2, plantar responses were flexor bilaterally.   DIAGNOSTIC DATA (LABS, IMAGING, TESTING) - I reviewed patient records, labs, notes, testing and imaging myself where available.  Lab Results  Component Value Date   WBC 3.9* 06/20/2013   HGB 14.1 06/20/2013   HCT 44.4 06/20/2013   MCV 100.9* 06/20/2013   PLT 227 02/16/2013      Component Value Date/Time   NA 141 06/20/2013 1211   K 4.2 06/20/2013 1211   CL 107 06/20/2013 1211   CO2 28 06/20/2013 1211   GLUCOSE 93 06/20/2013 1211   BUN 8 06/20/2013 1211   CREATININE 0.91 06/20/2013 1211   CREATININE 0.89 08/18/2011 2215   CALCIUM 10.0 06/20/2013 1211   PROT 8.1 06/20/2013 1211   ALBUMIN 4.3 06/20/2013 1211   AST 25 06/20/2013 1211   ALT 26 06/20/2013 1211   ALKPHOS 70 06/20/2013 1211   BILITOT 0.4 06/20/2013 1211   GFRNONAA >89 02/16/2013 1903   GFRNONAA >90 08/18/2011 2215   GFRAA >89 02/16/2013 1903   GFRAA >90 08/18/2011 2215    ASSESSMENT AND PLAN  Gene Anderson is a 53 y.o. male complains of  bilateral hands, and feet paresthesia, essentially normal neurological examination, differentiation diagnosis including peripheral neuropathy, need to rule out cervical radiculopathy, with her complains of chronic neck pain, radiating pain to left upper extremity  Complete evaluation with EMG nerve conduction study MRI of cervical   laboratory evaluations     Marcial Pacas, M.D. Ph.D.  [facility] [address], [place], [place] [address] [phone]

## 2014-02-17 LAB — COMPREHENSIVE METABOLIC PANEL
ALT: 27 IU/L (ref 0–44)
AST: 24 IU/L (ref 0–40)
Albumin/Globulin Ratio: 1.6 (ref 1.1–2.5)
Albumin: 4.6 g/dL (ref 3.5–5.5)
Alkaline Phosphatase: 75 IU/L (ref 39–117)
BUN/Creatinine Ratio: 16 (ref 9–20)
BUN: 13 mg/dL (ref 6–24)
CALCIUM: 9.4 mg/dL (ref 8.7–10.2)
CO2: 22 mmol/L (ref 18–29)
CREATININE: 0.8 mg/dL (ref 0.76–1.27)
Chloride: 103 mmol/L (ref 97–108)
GFR calc Af Amer: 120 mL/min/{1.73_m2} (ref >59)
GFR calc non Af Amer: 103 mL/min/{1.73_m2} (ref >59)
Globulin, Total: 2.9 g/dL (ref 1.5–4.5)
Glucose: 86 mg/dL (ref 65–99)
POTASSIUM: 4.4 mmol/L (ref 3.5–5.2)
SODIUM: 144 mmol/L (ref 134–144)
Total Bilirubin: 0.3 mg/dL (ref 0.0–1.2)
Total Protein: 7.5 g/dL (ref 6.0–8.5)

## 2014-02-17 LAB — THYROID PANEL WITH TSH
Free Thyroxine Index: 2.2 (ref 1.2–4.9)
T3 Uptake Ratio: 32 % (ref 24–39)
T4, Total: 7 ug/dL (ref 4.5–12.0)
TSH: 0.547 u[IU]/mL (ref 0.450–4.500)

## 2014-02-17 LAB — CBC WITH DIFFERENTIAL
BASOS: 1 %
Basophils Absolute: 0 10*3/uL (ref 0.0–0.2)
EOS ABS: 0.1 10*3/uL (ref 0.0–0.4)
EOS: 3 %
HCT: 42.1 % (ref 37.5–51.0)
Hemoglobin: 14.5 g/dL (ref 12.6–17.7)
IMMATURE GRANS (ABS): 0 10*3/uL (ref 0.0–0.1)
IMMATURE GRANULOCYTES: 0 %
LYMPHS: 48 %
Lymphocytes Absolute: 1.6 10*3/uL (ref 0.7–3.1)
MCH: 32.3 pg (ref 26.6–33.0)
MCHC: 34.4 g/dL (ref 31.5–35.7)
MCV: 94 fL (ref 79–97)
Monocytes Absolute: 0.4 10*3/uL (ref 0.1–0.9)
Monocytes: 13 %
NEUTROS PCT: 35 %
Neutrophils Absolute: 1.1 10*3/uL — ABNORMAL LOW (ref 1.4–7.0)
Platelets: 240 10*3/uL (ref 150–379)
RBC: 4.49 x10E6/uL (ref 4.14–5.80)
RDW: 13.6 % (ref 12.3–15.4)
WBC: 3.2 10*3/uL — ABNORMAL LOW (ref 3.4–10.8)

## 2014-02-17 LAB — FOLATE: Folate: 9.3 ng/mL (ref >3.0)

## 2014-02-17 LAB — CK: Total CK: 488 U/L — ABNORMAL HIGH (ref 24–204)

## 2014-02-17 LAB — HIV ANTIBODY (ROUTINE TESTING W REFLEX)
HIV 1/HIV 2 AB: NONREACTIVE
HIV 1/O/2 Abs-Index Value: <1 (ref <1.00)

## 2014-02-17 LAB — C-REACTIVE PROTEIN: CRP: 3.1 mg/L (ref 0.0–4.9)

## 2014-02-17 LAB — SEDIMENTATION RATE: SED RATE: 16 mm/h (ref 0–30)

## 2014-02-17 LAB — VITAMIN B12: VITAMIN B 12: 393 pg/mL (ref 211–946)

## 2014-02-17 LAB — ANA W/REFLEX IF POSITIVE: ANA: NEGATIVE

## 2014-02-17 LAB — HGB A1C W/O EAG: HEMOGLOBIN A1C: 5.8 % — AB (ref 4.8–5.6)

## 2014-02-17 LAB — RPR: SYPHILIS RPR SCR: NONREACTIVE

## 2014-03-02 ENCOUNTER — Other Ambulatory Visit: Payer: BC Managed Care – PPO

## 2014-03-07 ENCOUNTER — Encounter: Payer: BC Managed Care – PPO | Admitting: Neurology

## 2014-03-07 ENCOUNTER — Encounter: Payer: BC Managed Care – PPO | Admitting: Radiology

## 2014-03-10 ENCOUNTER — Encounter: Payer: BC Managed Care – PPO | Admitting: Neurology

## 2014-03-30 HISTORY — PX: CARDIOVERSION: SHX1299

## 2014-04-08 ENCOUNTER — Emergency Department (HOSPITAL_COMMUNITY)
Admission: EM | Admit: 2014-04-08 | Discharge: 2014-04-08 | Disposition: A | Discharge status: Home / Self Care | Payer: BC Managed Care – PPO | Attending: Emergency Medicine | Admitting: Emergency Medicine

## 2014-04-08 ENCOUNTER — Emergency Department (HOSPITAL_COMMUNITY): Payer: BC Managed Care – PPO

## 2014-04-08 ENCOUNTER — Encounter (HOSPITAL_COMMUNITY): Payer: Self-pay | Admitting: Emergency Medicine

## 2014-04-08 DIAGNOSIS — F17200 Nicotine dependence, unspecified, uncomplicated: Secondary | ICD-10-CM | POA: Insufficient documentation

## 2014-04-08 DIAGNOSIS — Z8739 Personal history of other diseases of the musculoskeletal system and connective tissue: Secondary | ICD-10-CM | POA: Insufficient documentation

## 2014-04-08 DIAGNOSIS — Z87448 Personal history of other diseases of urinary system: Secondary | ICD-10-CM | POA: Insufficient documentation

## 2014-04-08 DIAGNOSIS — Z860100 Personal history of colon polyps, unspecified: Secondary | ICD-10-CM | POA: Insufficient documentation

## 2014-04-08 DIAGNOSIS — Z8719 Personal history of other diseases of the digestive system: Secondary | ICD-10-CM | POA: Insufficient documentation

## 2014-04-08 DIAGNOSIS — I4891 Unspecified atrial fibrillation: Secondary | ICD-10-CM | POA: Insufficient documentation

## 2014-04-08 LAB — TSH: TSH: 0.621 u[IU]/mL (ref 0.350–4.500)

## 2014-04-08 LAB — CBC
HCT: 45.7 % (ref 39.0–52.0)
HEMOGLOBIN: 16.2 g/dL (ref 13.0–17.0)
MCH: 32.1 pg (ref 26.0–34.0)
MCHC: 35.4 g/dL (ref 30.0–36.0)
MCV: 90.7 fL (ref 78.0–100.0)
Platelets: 270 10*3/uL (ref 150–400)
RBC: 5.04 MIL/uL (ref 4.22–5.81)
RDW: 12.5 % (ref 11.5–15.5)
WBC: 4.7 10*3/uL (ref 4.0–10.5)

## 2014-04-08 LAB — BASIC METABOLIC PANEL
ANION GAP: 14 (ref 5–15)
BUN: 11 mg/dL (ref 6–23)
CO2: 24 mEq/L (ref 19–32)
Calcium: 9.8 mg/dL (ref 8.4–10.5)
Chloride: 104 mEq/L (ref 96–112)
Creatinine, Ser: 0.88 mg/dL (ref 0.50–1.35)
GLUCOSE: 116 mg/dL — AB (ref 70–99)
POTASSIUM: 4.1 meq/L (ref 3.7–5.3)
SODIUM: 142 meq/L (ref 137–147)

## 2014-04-08 LAB — TROPONIN I

## 2014-04-08 MED ORDER — DILTIAZEM HCL ER COATED BEADS 300 MG PO CP24: 300.0000 mg | ORAL_CAPSULE | Freq: Every day | ORAL | Status: DC

## 2014-04-08 MED ORDER — ETOMIDATE 2 MG/ML IV SOLN
INTRAVENOUS | Status: AC | PRN
  Administered 2014-04-08: 20 mg via INTRAVENOUS

## 2014-04-08 MED ORDER — SODIUM CHLORIDE 0.9 % IV SOLN
1000.0000 mL | Freq: Once | INTRAVENOUS | Status: AC
  Administered 2014-04-08: 1000 mL via INTRAVENOUS

## 2014-04-08 MED ORDER — SODIUM CHLORIDE 0.9 % IV SOLN
1000.0000 mL | INTRAVENOUS | Status: DC
  Administered 2014-04-08: 1000 mL via INTRAVENOUS

## 2014-04-08 MED ORDER — DILTIAZEM LOAD VIA INFUSION
15.0000 mg | Freq: Once | INTRAVENOUS | Status: AC
  Administered 2014-04-08: 15 mg via INTRAVENOUS
  Filled 2014-04-08: qty 15

## 2014-04-08 MED ORDER — DILTIAZEM HCL 100 MG IV SOLR
5.0000 mg/h | INTRAVENOUS | Status: DC
  Administered 2014-04-08: 5 mg/h via INTRAVENOUS
  Filled 2014-04-08: qty 100

## 2014-04-08 MED ORDER — ETOMIDATE 2 MG/ML IV SOLN
INTRAVENOUS | Status: AC
  Filled 2014-04-08: qty 20

## 2014-04-08 MED ORDER — DILTIAZEM HCL ER COATED BEADS 300 MG PO CP24
300.0000 mg | ORAL_CAPSULE | Freq: Once | ORAL | Status: AC
  Administered 2014-04-08: 300 mg via ORAL
  Filled 2014-04-08: qty 1

## 2014-04-08 MED ORDER — ROCURONIUM BROMIDE 50 MG/5ML IV SOLN
INTRAVENOUS | Status: AC
  Filled 2014-04-08: qty 2

## 2014-04-08 MED ORDER — LIDOCAINE HCL (CARDIAC) 20 MG/ML IV SOLN
INTRAVENOUS | Status: AC
  Filled 2014-04-08: qty 5

## 2014-04-08 MED ORDER — ETOMIDATE 2 MG/ML IV SOLN
0.2000 mg/kg | Freq: Once | INTRAVENOUS | Status: AC
  Administered 2014-04-08: 19.96 mg via INTRAVENOUS

## 2014-04-08 MED ORDER — SUCCINYLCHOLINE CHLORIDE 20 MG/ML IJ SOLN
INTRAMUSCULAR | Status: DC
Start: 2014-04-08 — End: 2014-04-08
  Filled 2014-04-08: qty 1

## 2014-04-08 MED ORDER — ASPIRIN 81 MG PO CHEW: 81.0000 mg | CHEWABLE_TABLET | Freq: Every day | ORAL | Status: DC

## 2014-04-08 NOTE — ED Provider Notes (Signed)
CSN: [id number]     Arrival date & time 04/08/14  1105 History   First MD Initiated Contact with Patient 04/08/14 1113     Chief Complaint  Patient presents with  . Chest Pain      HPI Patient presents to the emergency department reporting palpitations that began yesterday.  He states he felt nauseated and felt cramping after working outside all day in the heat.  He tried to orally hydrate himself.  He states he slept poorly.  He continued to feel somewhat lightheaded today as well as weak.  He had some anterior chest discomfort with associated sweating.  He reported ongoing palpitations.  No prior history of cardiac disease.  He smokes cigarettes.  He's never had atrial fibrillation before.  He presents with rapid atrial fibrillation.   Past Medical History  Diagnosis Date  . Spinal stenosis   . Prostate enlargement   . Fatty liver 06/20/13  . Internal hemorrhoids   . Diverticulosis   . Hyperplastic colon polyp    Past Surgical History  Procedure Laterality Date  . Flexible cystoscopy and repair of torn tunica albuginea     Family History  Problem Relation Age of Onset  . Cancer Father   . Stroke Maternal Grandfather   . Cancer Paternal Grandmother    History  Substance Use Topics  . Smoking status: Current Every Day Smoker -- 1.00 packs/day for 36 years    Types: Cigarettes  . Smokeless tobacco: Never Used  . Alcohol Use: Yes     Comment: social    Review of Systems  All other systems reviewed and are negative.     Allergies  Review of patient's allergies indicates no known allergies.  Home Medications   Prior to Admission medications   Medication Sig Start Date End Date Taking? Authorizing Provider  oxyCODONE-acetaminophen (PERCOCET) 10-325 MG per tablet Take 1 tablet by mouth every 6 (six) hours as needed for pain.   Yes Historical Provider, MD  Polyethyl Glycol-Propyl Glycol (SYSTANE) 0.4-0.3 % SOLN Place 2 drops into both eyes daily as needed.     Yes  Historical Provider, MD   BP 132/88  Pulse 74  Temp(Src) 98.2 F (36.8 C) (Oral)  Resp 20  SpO2 100% Physical Exam  Nursing note and vitals reviewed. Constitutional: He is oriented to person, place, and time. He appears well-developed and well-nourished.  HENT:  Head: Normocephalic and atraumatic.  Eyes: EOM are normal.  Neck: Normal range of motion.  Cardiovascular: Normal heart sounds and intact distal pulses.   Irregularly irregular.  Tachycardic  Pulmonary/Chest: Effort normal and breath sounds normal. No respiratory distress.  Abdominal: Soft. He exhibits no distension. There is no tenderness.  Musculoskeletal: Normal range of motion.  Neurological: He is alert and oriented to person, place, and time.  Skin: Skin is warm and dry.  Psychiatric: He has a normal mood and affect. Judgment normal.    ED Course  CARDIOVERSION Performed by: Hoy Morn Authorized by: Hoy Morn Consent: Verbal consent obtained. Risks and benefits: risks, benefits and alternatives were discussed Consent given by: patient and spouse Patient understanding: patient states understanding of the procedure being performed Patient consent: the patient's understanding of the procedure matches consent given Required items: required blood products, implants, devices, and special equipment available Patient identity confirmed: verbally with patient Time out: Immediately prior to procedure a "time out" was called to verify the correct patient, procedure, equipment, support staff and site/side marked as required. Patient sedated:  yes Sedatives: etomidate Vitals: Vital signs were monitored during sedation. Cardioversion basis: elective Indications: failure of anti-arrhythmic medications Pre-procedure rhythm: atrial fibrillation Patient position: patient was placed in a supine position Chest area: chest area exposed Electrodes: pads Electrodes placed: anterior-posterior Number of attempts:  1 Attempt 1 mode: synchronous Attempt 1 waveform: biphasic Attempt 1 shock (in Joules): 120 Attempt 1 outcome: conversion to normal sinus rhythm Post-procedure rhythm: normal sinus rhythm Complications: no complications Patient tolerance: Patient tolerated the procedure well with no immediate complications.     Procedural sedation Performed by: Hoy Morn Consent: Verbal consent obtained. Risks and benefits: risks, benefits and alternatives were discussed Required items: required blood products, implants, devices, and special equipment available Patient identity confirmed: arm band and provided demographic data Time out: Immediately prior to procedure a "time out" was called to verify the correct patient, procedure, equipment, support staff and site/side marked as required. Sedation type: moderate (conscious) sedation NPO time confirmed and considedered Sedatives: ETOMIDATE Physician Time at Bedside: 16 Vitals: Vital signs were monitored during sedation. Cardiac Monitor, pulse oximeter Patient tolerance: Patient tolerated the procedure well with no immediate complications. Comments: Pt with uneventful recovered. Returned to pre-procedural sedation baseline   Labs Review Labs Reviewed  BASIC METABOLIC PANEL - Abnormal; Notable for the following:    Glucose, Bld 116 (*)    All other components within normal limits  CBC  TROPONIN I  TSH    Imaging Review Dg Chest Portable 1 View  04/08/2014   CLINICAL DATA:  Upper chest pain with intermittent left discomfort; history of tobacco use  EXAM: PORTABLE CHEST - 1 VIEW  COMPARISON:  Portable chest x-ray of June 01, 2010.  FINDINGS: The lungs are well-expanded and clear. The heart and mediastinal structures are normal. There is no pleural effusion or pneumothorax. There is mild degenerative change of the right AC joint.  IMPRESSION: There is no active cardiopulmonary disease.   Electronically Signed   By: David  Martinique   On:  04/08/2014 12:56  I personally reviewed the imaging tests through PACS system I reviewed available ER/hospitalization records through the EMR    EKG Interpretation   Date/Time:  Friday April 08 2014 11:11:28 EDT Ventricular Rate:  157 PR Interval:    QRS Duration: 81 QT Interval:  306 QTC Calculation: 494 R Axis:   -11 Text Interpretation:  Atrial fibrillation Paired ventricular premature  complexes Borderline prolonged QT interval Atrial fibrillation is new  since prior examination Confirmed by Tuck Dulworth  MD, Vaunda Gutterman ([id number]) on  04/08/2014 11:14:24 AM      MDM   Final diagnoses:  Atrial fibrillation with rapid ventricular response    New-onset atrial fibrillation with rapid ventricular response.  Suspect the majority of his symptoms yesterday were more heat related illness in addition to atrial fibrillation.  IV fluids.  Cardizem.  Labs, chest x-ray.  3:54 PM He felt strongly that the palpitations started at 4 PM yesterday.  I had a brief discussion with cardiology who could not find a contraindication to cardioversion.  The patient preferred cardioversion as he would like to go home.  Patient and family were educated about cardioversion.  Patient was sedated with etomidate.  Cardioversion was performed with return to normal sinus rhythm.  The patient tolerated the procedure well.  Patient will be followed up in the cardiology office.  Thyroid studies are pending.  It is recommended that the patient be started on extended release Cardizem 300 mg daily.  Patient is given his first dose  in emergency apartment.  Observed him for just over an hour with no return of ectopy.  Patient feels good.  He related in the emergency department.  Discharge home in good condition.  Hoy Morn, MD 04/08/14 [phone]

## 2014-04-08 NOTE — Discharge Instructions (Signed)
Atrial Fibrillation  Atrial fibrillation is a type of irregular heart rhythm (arrhythmia). During atrial fibrillation, the upper chambers of the heart (atria) quiver continuously in a chaotic pattern. This causes an irregular and often rapid heart rate.   Atrial fibrillation is the result of the heart becoming overloaded with disorganized signals that tell it to beat. These signals are normally released one at a time by a part of the right atrium called the sinoatrial node. They then travel from the atria to the lower chambers of the heart (ventricles), causing the atria and ventricles to contract and pump blood as they pass. In atrial fibrillation, parts of the atria outside of the sinoatrial node also release these signals. This results in two problems. First, the atria receive so many signals that they do not have time to fully contract. Second, the ventricles, which can only receive one signal at a time, beat irregularly and out of rhythm with the atria.   There are three types of atrial fibrillation:   · Paroxysmal. Paroxysmal atrial fibrillation starts suddenly and stops on its own within a week.  · Persistent. Persistent atrial fibrillation lasts for more than a week. It may stop on its own or with treatment.  · Permanent. Permanent atrial fibrillation does not go away. Episodes of atrial fibrillation may lead to permanent atrial fibrillation.  Atrial fibrillation can prevent your heart from pumping blood normally. It increases your risk of stroke and can lead to heart failure.   CAUSES   · Heart conditions, including a heart attack, heart failure, coronary artery disease, and heart valve conditions.    · Inflammation of the sac that surrounds the heart (pericarditis).  · Blockage of an artery in the lungs (pulmonary embolism).  · Pneumonia or other infections.  · Chronic lung disease.  · Thyroid problems, especially if the thyroid is overactive (hyperthyroidism).  · Caffeine, excessive alcohol use, and use  of some illegal drugs.    · Use of some medicines, including certain decongestants and diet pills.  · Heart surgery.    · Birth defects.    Sometimes, no cause can be found. When this happens, the atrial fibrillation is called lone atrial fibrillation. The risk of complications from atrial fibrillation increases if you have lone atrial fibrillation and you are age 60 years or older.  RISK FACTORS  · Heart failure.  · Coronary artery disease.  · Diabetes mellitus.    · High blood pressure (hypertension).    · Obesity.    · Other arrhythmias.    · Increased age.  SYMPTOMS   · A feeling that your heart is beating rapidly or irregularly.    · A feeling of discomfort or pain in your chest.    · Shortness of breath.    · Sudden light-headedness or weakness.    · Getting tired easily when exercising.    · Urinating more often than normal (mainly when atrial fibrillation first begins).    In paroxysmal atrial fibrillation, symptoms may start and suddenly stop.  DIAGNOSIS   Your health care provider may be able to detect atrial fibrillation when taking your pulse. Your health care provider may have you take a test called an ambulatory electrocardiogram (ECG). An ECG records your heartbeat patterns over a 24-hour period. You may also have other tests, such as:  · Transthoracic echocardiogram (TTE). During echocardiography, sound waves are used to evaluate how blood flows through your heart.  · Transesophageal echocardiogram (TEE).  · Stress test. There is more than one type of stress test. If a stress test is   needed, ask your health care provider about which type is best for you.    · Chest X-ray exam.  · Blood tests.  · Computed tomography (CT).  TREATMENT   Treatment may include:  · Treating any underlying conditions. For example, if you have an overactive thyroid, treating the condition may correct atrial fibrillation.  · Taking medicine. Medicines may be given to control a rapid heart rate or to prevent blood clots, heart  failure, or a stroke.  · Having a procedure to correct the rhythm of the heart:  ¨ Electrical cardioversion. During electrical cardioversion, a controlled, low-energy shock is delivered to the heart through your skin. If you have chest pain, very low blood pressure, or sudden heart failure, this procedure may need to be done as an emergency.  ¨ Catheter ablation. During this procedure, heart tissues that send the signals that cause atrial fibrillation are destroyed.  ¨ Maze or minimaze procedure. During this surgery, thin lines of heart tissue that carry the abnormal signals are destroyed. The maze procedure is an open-heart surgery. The minimaze procedure is a minimally invasive surgery. This means that small cuts are made to access the heart instead of a large opening.  ¨ Pulmonary venous isolation. During this surgery, tissue around the veins that carry blood from the lungs (pulmonary veins) is destroyed. This tissue is thought to carry the abnormal signals.  HOME CARE INSTRUCTIONS   · Only take medicines that your health care provider approves, and take them as directed. Some medicines can make atrial fibrillation worse or recur.  · If blood thinners were prescribed by your health care provider, take them exactly as directed. Too much blood-thinning medicine can cause bleeding. If you take too little, you will not have the needed protection against stroke and other problems.  · Perform blood tests at home if directed by your health care provider. Perform blood tests exactly as directed.  · Quit smoking if you smoke.  · Do not drink alcohol.  · Do not drink caffeinated beverages such as coffee, soda, and some teas. You may drink decaffeinated coffee, soda, or tea.    · Maintain a healthy weight. Do not use diet pills unless your health care provider approves. They may make heart problems worse.    · Follow diet instructions as directed by your health care provider.  · Exercise regularly as directed by your health  care provider.  · Keep all follow-up appointments with your health care provider.  PREVENTION   The following substances can cause atrial fibrillation to recur:   · Caffeinated beverages.  · Alcohol.  · Certain medicines, especially those used for breathing problems.  · Certain herbs and herbal medicines, such as those containing ephedra or ginseng.  · Illegal drugs, such as cocaine and amphetamines.  Sometimes medicines are given to prevent atrial fibrillation from recurring. Proper treatment of any underlying condition is also important in helping prevent recurrence.   SEEK MEDICAL CARE IF:  · You notice a change in the rate, rhythm, or strength of your heartbeat.  · You suddenly begin urinating more frequently.  · You tire more easily when exerting yourself or exercising.  SEEK IMMEDIATE MEDICAL CARE IF:   · You have chest pain, abdominal pain, sweating, or weakness.  · You feel nauseous.  · You have shortness of breath.  · You suddenly have swollen feet and ankles.  · You feel dizzy.  · Your face or limbs feel numb or weak.  · You have a change

## 2014-04-08 NOTE — ED Notes (Signed)
Pt states he was working outside yesterday and got hot. Pt went home and took a bath and got headache. Pt c/o anterior chest pain with sweating and weakness.

## 2014-04-08 NOTE — ED Notes (Signed)
Cardioversion performed. See sedation narrative for staff present. Time out was called. Pre and post EKG strips were taken. Pt responded well to treatment. Pt is A&O after sedation has worn off. Pt now in NSR.

## 2014-04-11 ENCOUNTER — Telehealth: Payer: Self-pay | Admitting: *Deleted

## 2014-04-11 NOTE — Telephone Encounter (Signed)
Message copied by Richmond Campbell on Mon Apr 11, 2014 11:34 AM ------      Message from: Josue Hector      Created: Fri Apr 08, 2014  1:49 PM       Need post ER f/u for new onset afib next 1-2 weeks ------

## 2014-04-11 NOTE — Telephone Encounter (Signed)
PT  SEEING   DR Lovena Le ON 04-18-14 AT  2:30 PM .Adonis Housekeeper

## 2014-04-11 NOTE — Telephone Encounter (Signed)
UNABLE  TO LEAVE MESSAGE  MAIL BOX  FULL  WILL TRY AGAIN LATER./CY

## 2014-04-18 ENCOUNTER — Ambulatory Visit (INDEPENDENT_AMBULATORY_CARE_PROVIDER_SITE_OTHER): Payer: BC Managed Care – PPO | Admitting: Internal Medicine

## 2014-04-18 VITALS — BP 118/79 | HR 79 | Ht 73.0 in | Wt 223.0 lb

## 2014-04-18 DIAGNOSIS — I480 Paroxysmal atrial fibrillation: Secondary | ICD-10-CM

## 2014-04-18 DIAGNOSIS — I4891 Unspecified atrial fibrillation: Secondary | ICD-10-CM | POA: Insufficient documentation

## 2014-04-18 NOTE — Assessment & Plan Note (Signed)
He is back in NSR and appears to be maintaining NSR. I have recommended that the patient continue his calcium channel blocker and ASA. His risk for stroke is low. I have strongly encouraged to the patient to reduce his ETOH intake. I have counceled him that more than 2-3 oz of vodka a day increases his risk of atrial fib. He denies caffeine excess.

## 2014-04-18 NOTE — Progress Notes (Signed)
HPI Gene Anderson is referred today by the ER for evaluation of atrial fibrillation. He is a pleasant 53 yo man with a h/o HTN who presented to the ER several weeks ago with atrial fib and a RVR. He was cardioverted back to NSR. He has not had a recurrence since then. He has been stable in the interim. He admits to ETOH abuse drinking a fifth of vodka every weekend though he does not drink as much during the week. He denies chest pain or sob.  He is limited in his activitiy by back pain and has spinal stenosis. He still smokes 1/2 to one PPD.  No Known Allergies   Current Outpatient Prescriptions  Medication Sig Dispense Refill  . aspirin 81 MG chewable tablet Chew 1 tablet (81 mg total) by mouth daily.  30 tablet  0  . diltiazem (CARDIZEM CD) 300 MG 24 hr capsule Take 1 capsule (300 mg total) by mouth daily.  30 capsule  0  . oxyCODONE-acetaminophen (PERCOCET) 10-325 MG per tablet Take 1 tablet by mouth every 6 (six) hours as needed for pain.      Vladimir Faster Glycol-Propyl Glycol (SYSTANE) 0.4-0.3 % SOLN Place 2 drops into both eyes daily as needed.         No current facility-administered medications for this visit.     Past Medical History  Diagnosis Date  . Spinal stenosis   . Prostate enlargement   . Fatty liver 06/20/13  . Internal hemorrhoids   . Diverticulosis   . Hyperplastic colon polyp   . Atrial fibrillation   . Leukopenia   . Neck pain     ROS:   All systems reviewed and negative except as noted in the HPI.   Past Surgical History  Procedure Laterality Date  . Flexible cystoscopy and repair of torn tunica albuginea       Family History  Problem Relation Age of Onset  . Cancer Father   . Stroke Maternal Grandfather   . Cancer Paternal Grandmother      History   Social History  . Marital Status: Single    Spouse Name: N/A    Number of Children: 4  . Years of Education: 14   Occupational History  .     Social History Main Topics  . Smoking  status: Current Every Day Smoker -- 1.00 packs/day for 36 years    Types: Cigarettes  . Smokeless tobacco: Never Used  . Alcohol Use: Yes     Comment: social  . Drug Use: No  . Sexual Activity: Not on file   Other Topics Concern  . Not on file   Social History Narrative   Patient lives at home with his girl friend.   Patient is not working at this time.   Right handed.   Education college education.   Caffeine None     BP 118/79  Pulse 79  Ht 6\' 1"  (1.854 m)  Wt 223 lb (101.152 kg)  BMI 29.43 kg/m2  Physical Exam:  Well appearing middle aged man, NAD HEENT: Unremarkable Neck:  No JVD, no thyromegally Back:  No CVA tenderness Lungs:  Clear with no wheezes HEART:  Regular rate rhythm, no murmurs, no rubs, no clicks Abd:  soft, positive bowel sounds, no organomegally, no rebound, no guarding Ext:  2 plus pulses, no edema, no cyanosis, no clubbing Skin:  No rashes no nodules Neuro:  CN II through XII intact, motor grossly intact  EKG  nsr  Assess/Plan:

## 2014-04-18 NOTE — Patient Instructions (Signed)
Your physician recommends that you continue on your current medications as directed. Please refer to the Current Medication list given to you today.  Your physician wants you to follow-up in: 6 months with Dr. Lovena Le.  You will receive a reminder letter in the mail two months in advance. If you don't receive a letter, please call our office to schedule the follow-up appointment.

## 2014-07-04 ENCOUNTER — Other Ambulatory Visit: Payer: Self-pay | Admitting: Physical Medicine and Rehabilitation

## 2014-07-04 DIAGNOSIS — M545 Low back pain: Secondary | ICD-10-CM

## 2014-07-07 ENCOUNTER — Ambulatory Visit
Admission: RE | Admit: 2014-07-07 | Discharge: 2014-07-07 | Disposition: A | Discharge status: Home / Self Care | Payer: BC Managed Care – PPO | Source: Ambulatory Visit | Attending: Physical Medicine and Rehabilitation | Admitting: Physical Medicine and Rehabilitation

## 2014-07-07 DIAGNOSIS — M545 Low back pain: Secondary | ICD-10-CM

## 2015-01-09 ENCOUNTER — Ambulatory Visit: Payer: BLUE CROSS/BLUE SHIELD | Attending: Physical Medicine and Rehabilitation | Admitting: Physical Therapy

## 2015-01-09 DIAGNOSIS — M62830 Muscle spasm of back: Secondary | ICD-10-CM | POA: Diagnosis not present

## 2015-01-09 DIAGNOSIS — M5386 Other specified dorsopathies, lumbar region: Secondary | ICD-10-CM

## 2015-01-09 DIAGNOSIS — M545 Low back pain, unspecified: Secondary | ICD-10-CM

## 2015-01-09 DIAGNOSIS — M2560 Stiffness of unspecified joint, not elsewhere classified: Secondary | ICD-10-CM | POA: Insufficient documentation

## 2015-01-09 DIAGNOSIS — M2569 Stiffness of other specified joint, not elsewhere classified: Secondary | ICD-10-CM
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
## 2015-01-09 NOTE — Patient Instructions (Signed)
   Beaux Wedemeyer PT, DPT, LAT, ATC  [facility] Phone: [phone]     

## 2015-01-24 ENCOUNTER — Encounter: Payer: BLUE CROSS/BLUE SHIELD | Admitting: Physical Therapy

## 2015-01-26 ENCOUNTER — Ambulatory Visit: Payer: BLUE CROSS/BLUE SHIELD | Admitting: Physical Therapy

## 2015-01-31 ENCOUNTER — Ambulatory Visit: Payer: BLUE CROSS/BLUE SHIELD | Attending: Physical Medicine and Rehabilitation | Admitting: Physical Therapy

## 2015-01-31 DIAGNOSIS — M545 Low back pain: Secondary | ICD-10-CM | POA: Insufficient documentation

## 2015-01-31 DIAGNOSIS — M62830 Muscle spasm of back: Secondary | ICD-10-CM | POA: Insufficient documentation

## 2015-01-31 DIAGNOSIS — M2560 Stiffness of unspecified joint, not elsewhere classified: Secondary | ICD-10-CM | POA: Insufficient documentation

## 2015-02-02 ENCOUNTER — Ambulatory Visit: Payer: BLUE CROSS/BLUE SHIELD | Admitting: Physical Therapy

## 2015-02-02 ENCOUNTER — Encounter: Payer: BLUE CROSS/BLUE SHIELD | Admitting: Physical Therapy

## 2015-02-07 ENCOUNTER — Ambulatory Visit: Payer: BLUE CROSS/BLUE SHIELD | Admitting: Physical Therapy

## 2015-02-07 ENCOUNTER — Encounter: Payer: BLUE CROSS/BLUE SHIELD | Admitting: Physical Therapy

## 2015-02-09 ENCOUNTER — Encounter: Payer: BLUE CROSS/BLUE SHIELD | Admitting: Physical Therapy

## 2015-02-09 ENCOUNTER — Ambulatory Visit: Payer: BLUE CROSS/BLUE SHIELD | Admitting: Physical Therapy

## 2015-02-14 ENCOUNTER — Encounter: Payer: BLUE CROSS/BLUE SHIELD | Admitting: Physical Therapy

## 2015-02-14 ENCOUNTER — Ambulatory Visit: Payer: BLUE CROSS/BLUE SHIELD | Admitting: Physical Therapy

## 2015-02-16 ENCOUNTER — Encounter: Payer: BLUE CROSS/BLUE SHIELD | Admitting: Physical Therapy

## 2015-02-21 ENCOUNTER — Encounter: Payer: BLUE CROSS/BLUE SHIELD | Admitting: Physical Therapy

## 2015-02-23 ENCOUNTER — Encounter: Payer: BLUE CROSS/BLUE SHIELD | Admitting: Physical Therapy

## 2015-07-04 ENCOUNTER — Encounter: Payer: Self-pay | Admitting: Emergency Medicine

## 2015-07-04 ENCOUNTER — Ambulatory Visit: Payer: Medicaid Other

## 2015-07-14 ENCOUNTER — Other Ambulatory Visit (HOSPITAL_COMMUNITY): Payer: Self-pay | Admitting: Family Medicine

## 2015-07-14 ENCOUNTER — Ambulatory Visit (HOSPITAL_COMMUNITY)
Admission: RE | Admit: 2015-07-14 | Discharge: 2015-07-14 | Disposition: A | Discharge status: Home / Self Care | Payer: Medicaid Other | Source: Ambulatory Visit | Attending: Family Medicine | Admitting: Family Medicine

## 2015-07-14 DIAGNOSIS — Z87891 Personal history of nicotine dependence: Secondary | ICD-10-CM

## 2015-07-14 DIAGNOSIS — R05 Cough: Secondary | ICD-10-CM | POA: Insufficient documentation

## 2015-08-17 ENCOUNTER — Encounter (HOSPITAL_COMMUNITY): Payer: Self-pay | Admitting: Emergency Medicine

## 2015-08-17 ENCOUNTER — Emergency Department (HOSPITAL_COMMUNITY)
Admission: EM | Admit: 2015-08-17 | Discharge: 2015-08-17 | Disposition: A | Discharge status: Home / Self Care | Payer: Medicaid Other | Attending: Emergency Medicine | Admitting: Emergency Medicine

## 2015-08-17 ENCOUNTER — Emergency Department (EMERGENCY_DEPARTMENT_HOSPITAL): Payer: Medicaid Other

## 2015-08-17 ENCOUNTER — Emergency Department (HOSPITAL_COMMUNITY): Payer: Medicaid Other

## 2015-08-17 DIAGNOSIS — R600 Localized edema: Secondary | ICD-10-CM | POA: Diagnosis not present

## 2015-08-17 DIAGNOSIS — F17210 Nicotine dependence, cigarettes, uncomplicated: Secondary | ICD-10-CM | POA: Diagnosis not present

## 2015-08-17 DIAGNOSIS — I4891 Unspecified atrial fibrillation: Secondary | ICD-10-CM | POA: Insufficient documentation

## 2015-08-17 DIAGNOSIS — M79601 Pain in right arm: Secondary | ICD-10-CM | POA: Diagnosis present

## 2015-08-17 DIAGNOSIS — Z8719 Personal history of other diseases of the digestive system: Secondary | ICD-10-CM | POA: Insufficient documentation

## 2015-08-17 DIAGNOSIS — Z862 Personal history of diseases of the blood and blood-forming organs and certain disorders involving the immune mechanism: Secondary | ICD-10-CM | POA: Insufficient documentation

## 2015-08-17 DIAGNOSIS — M25521 Pain in right elbow: Secondary | ICD-10-CM | POA: Diagnosis not present

## 2015-08-17 DIAGNOSIS — Z87438 Personal history of other diseases of male genital organs: Secondary | ICD-10-CM | POA: Insufficient documentation

## 2015-08-17 DIAGNOSIS — Z86010 Personal history of colonic polyps: Secondary | ICD-10-CM | POA: Insufficient documentation

## 2015-08-17 DIAGNOSIS — M79609 Pain in unspecified limb: Secondary | ICD-10-CM

## 2015-08-17 MED ORDER — OXYCODONE-ACETAMINOPHEN 5-325 MG PO TABS
2.0000 | ORAL_TABLET | Freq: Once | ORAL | Status: AC
  Administered 2015-08-17: 2 via ORAL
  Filled 2015-08-17: qty 2

## 2015-08-17 MED ORDER — OXYCODONE-ACETAMINOPHEN 5-325 MG PO TABS: 2.0000 | ORAL_TABLET | Freq: Three times a day (TID) | ORAL | Status: DC | PRN

## 2015-08-17 MED ORDER — KETOROLAC TROMETHAMINE 60 MG/2ML IM SOLN
60.0000 mg | Freq: Once | INTRAMUSCULAR | Status: AC
  Administered 2015-08-17: 60 mg via INTRAMUSCULAR
  Filled 2015-08-17: qty 2

## 2015-08-17 NOTE — Progress Notes (Signed)
VASCULAR LAB PRELIMINARY  PRELIMINARY  PRELIMINARY  PRELIMINARY  Right upper extremity venous duplex completed.    Preliminary report:  Right :  No evidence of DVT or superficial thrombosis.    Nehemiah Mcfarren, RVS 08/17/2015, 4:18 PM

## 2015-08-17 NOTE — ED Notes (Signed)
Per pt, states right arm pain for a couple of days-no injury-possibly sleep on it wrong

## 2015-08-17 NOTE — ED Provider Notes (Signed)
CSN: [id number]     Arrival date & time 08/17/15  1055 History   First MD Initiated Contact with Patient 08/17/15 1309     Chief Complaint  Patient presents with  . right arm pain      (Consider location/radiation/quality/duration/timing/severity/associated sxs/prior Treatment) Patient is a 54 y.o. male presenting with extremity pain.  Extremity Pain This is a new problem. The current episode started yesterday. The problem occurs constantly. The problem has been gradually improving. Pertinent negatives include no chest pain, no abdominal pain and no shortness of breath. Nothing aggravates the symptoms. Nothing relieves the symptoms. He has tried nothing for the symptoms. The treatment provided no relief.    Past Medical History  Diagnosis Date  . Spinal stenosis   . Prostate enlargement   . Fatty liver 06/20/13  . Internal hemorrhoids   . Diverticulosis   . Hyperplastic colon polyp   . Atrial fibrillation ([place])   . Leukopenia   . Neck pain    Past Surgical History  Procedure Laterality Date  . Flexible cystoscopy and repair of torn tunica albuginea     Family History  Problem Relation Age of Onset  . Cancer Father   . Stroke Maternal Grandfather   . Cancer Paternal Grandmother    Social History  Substance Use Topics  . Smoking status: Current Every Day Smoker -- 1.00 packs/day for 36 years    Types: Cigarettes  . Smokeless tobacco: Never Used  . Alcohol Use: Yes     Comment: social    Review of Systems  Constitutional: Negative for fever and chills.  HENT: Negative for congestion and ear discharge.   Eyes: Negative for pain.  Respiratory: Negative for choking, chest tightness and shortness of breath.   Cardiovascular: Negative for chest pain.  Gastrointestinal: Negative for vomiting and abdominal pain.  Endocrine: Negative for polydipsia and polyuria.  Genitourinary: Negative for dysuria.  Musculoskeletal: Negative for back pain.       Right arm pain and  swelling, atraumatic  Neurological: Negative for dizziness, seizures and speech difficulty.  Psychiatric/Behavioral: Negative for confusion.  All other systems reviewed and are negative.     Allergies  Review of patient's allergies indicates no known allergies.  Home Medications   Prior to Admission medications   Medication Sig Start Date End Date Taking? Authorizing Provider  diclofenac sodium (VOLTAREN) 1 % GEL Apply 2 g topically 4 (four) times daily as needed (For pain.).   Yes Historical Provider, MD  naphazoline-pheniramine (NAPHCON-A) 0.025-0.3 % ophthalmic solution Place 1 drop into both eyes 4 (four) times daily as needed for irritation or allergies.   Yes Historical Provider, MD  Oxycodone HCl 10 MG TABS Take 10 mg by mouth 4 (four) times daily as needed (For pain.).  07/10/15  Yes Historical Provider, MD  Oxycodone HCl 20 MG TABS Take 20 mg by mouth 4 (four) times daily as needed. For pain. 08/10/15  Yes Historical Provider, MD  oxyCODONE-acetaminophen (PERCOCET) 10-325 MG per tablet Take 1 tablet by mouth every 6 (six) hours as needed for pain.   Yes Historical Provider, MD  aspirin 81 MG chewable tablet Chew 1 tablet (81 mg total) by mouth daily. Patient not taking: Reported on 08/17/2015 04/08/14   Jola Schmidt, MD  diltiazem (CARDIZEM CD) 300 MG 24 hr capsule Take 1 capsule (300 mg total) by mouth daily. Patient not taking: Reported on 08/17/2015 04/08/14   Jola Schmidt, MD   BP 136/100 mmHg  Pulse 76  Temp(Src) 97.5  F (36.4 C) (Oral)  Resp 16  SpO2 99% Physical Exam  Constitutional: He appears well-developed and well-nourished.  HENT:  Head: Normocephalic and atraumatic.  Eyes: Conjunctivae are normal. Pupils are equal, round, and reactive to light.  Neck: Normal range of motion.  Cardiovascular: Normal rate and regular rhythm.   Pulmonary/Chest: Effort normal. No respiratory distress.  Abdominal: Soft. He exhibits no distension.  Musculoskeletal: Normal range  of motion. He exhibits edema (right arm from mid forearm to mid upper arm, also with some ttp to medial arm on that side, normal radial pulse) and tenderness.  Neurological: He is alert.  Skin: Skin is warm and dry.  Nursing note and vitals reviewed.   ED Course  Procedures (including critical care time) Labs Review Labs Reviewed - No data to display  Imaging Review Dg Elbow 2 Views Right  08/17/2015  CLINICAL DATA:  Acute right elbow pain without injury. EXAM: RIGHT ELBOW - 2 VIEW COMPARISON:  None. FINDINGS: There is no evidence of fracture, dislocation, or joint effusion. Extensive osteophyte formation is noted, with spurring of the olecranon. Soft tissues are unremarkable. IMPRESSION: Degenerative changes as described above. No acute abnormality seen in the right elbow. Electronically Signed   By: Marijo Conception, M.D.   On: 08/17/2015 14:26   I have personally reviewed and evaluated these images and lab results as part of my medical decision-making.   EKG Interpretation None      MDM   Final diagnoses:  Right elbow pain   Concern for possible DVT vs bursitis. No e/o cellulitis. Will [place] and XR and dc if negative.   No dvt. No e/o fracture or bony pathology. Will tx for OA flare v bursitis, with pcp follow up if not improving soon.     Merrily Pew, MD 08/19/15 1100

## 2015-10-19 ENCOUNTER — Other Ambulatory Visit: Payer: Self-pay | Admitting: Neurosurgery

## 2015-11-13 ENCOUNTER — Inpatient Hospital Stay (HOSPITAL_COMMUNITY)
Admission: RE | Admit: 2015-11-13 | Discharge: 2015-11-13 | Disposition: A | Discharge status: Home / Self Care | Payer: Medicaid Other | Source: Ambulatory Visit

## 2015-11-21 ENCOUNTER — Encounter (HOSPITAL_COMMUNITY): Admission: RE | Payer: Self-pay | Source: Ambulatory Visit

## 2015-11-21 ENCOUNTER — Inpatient Hospital Stay (HOSPITAL_COMMUNITY): Admission: RE | Admit: 2015-11-21 | Payer: Medicaid Other | Source: Ambulatory Visit | Admitting: Neurosurgery

## 2015-11-21 SURGERY — ANTERIOR LATERAL LUMBAR FUSION 1 LEVEL
Anesthesia: General

## 2016-02-19 ENCOUNTER — Ambulatory Visit (INDEPENDENT_AMBULATORY_CARE_PROVIDER_SITE_OTHER): Payer: Medicaid Other

## 2016-02-19 ENCOUNTER — Encounter (HOSPITAL_COMMUNITY): Payer: Self-pay | Admitting: *Deleted

## 2016-02-19 ENCOUNTER — Ambulatory Visit (HOSPITAL_COMMUNITY)
Admission: EM | Admit: 2016-02-19 | Discharge: 2016-02-19 | Disposition: A | Discharge status: Home / Self Care | Payer: Medicaid Other | Attending: Family Medicine | Admitting: Family Medicine

## 2016-02-19 DIAGNOSIS — R0789 Other chest pain: Secondary | ICD-10-CM | POA: Diagnosis not present

## 2016-02-19 DIAGNOSIS — R079 Chest pain, unspecified: Secondary | ICD-10-CM

## 2016-02-19 NOTE — ED Provider Notes (Signed)
CSN: [id number]     Arrival date & time 02/19/16  1336 History   First MD Initiated Contact with Patient 02/19/16 1426     Chief Complaint  Patient presents with  . Chest Pain   (Consider location/radiation/quality/duration/timing/severity/associated sxs/prior Treatment) HPI History obtained from patient:  Pt presents with the cc of: Chest pain Duration of symptoms: 2 days Treatment prior to arrival: None Context: Onset of left upper chest discomfort sharp in nature without radiation hurts more when he takes a deep breath Other symptoms include: No shortness of breath Pain score: 1 FAMILY HISTORY: Cancer with his father stroke with grandparents    Past Medical History  Diagnosis Date  . Spinal stenosis   . Prostate enlargement   . Fatty liver 06/20/13  . Internal hemorrhoids   . Diverticulosis   . Hyperplastic colon polyp   . Atrial fibrillation ([place])   . Leukopenia   . Neck pain    Past Surgical History  Procedure Laterality Date  . Flexible cystoscopy and repair of torn tunica albuginea     Family History  Problem Relation Age of Onset  . Cancer Father   . Stroke Maternal Grandfather   . Cancer Paternal Grandmother    Social History  Substance Use Topics  . Smoking status: Current Every Day Smoker -- 1.00 packs/day for 36 years    Types: Cigarettes  . Smokeless tobacco: Never Used  . Alcohol Use: Yes     Comment: social    Review of Systems  Denies: HEADACHE, NAUSEA, ABDOMINAL PAIN, CONGESTION, DYSURIA, SHORTNESS OF BREATH  Allergies  Review of patient's allergies indicates no known allergies.  Home Medications   Prior to Admission medications   Medication Sig Start Date End Date Taking? Authorizing Provider  naproxen sodium (ALEVE) 220 MG tablet Take 440 mg by mouth at bedtime as needed.   Yes Historical Provider, MD  Oxycodone HCl 20 MG TABS Take 20 mg by mouth 4 (four) times daily as needed. For pain. 08/10/15  Yes Historical Provider, MD  diclofenac  sodium (VOLTAREN) 1 % GEL Apply 2 g topically 4 (four) times daily as needed (For pain.).    Historical Provider, MD  naphazoline-pheniramine (NAPHCON-A) 0.025-0.3 % ophthalmic solution Place 1 drop into both eyes 2 (two) times daily as needed for irritation or allergies (DEFINETLY USES IN THE MORNING).     Historical Provider, MD  Oxycodone HCl 10 MG TABS Take 10 mg by mouth 4 (four) times daily as needed (For pain.).  07/10/15   Historical Provider, MD  oxyCODONE-acetaminophen (PERCOCET/ROXICET) 5-325 MG tablet Take 2 tablets by mouth every 8 (eight) hours as needed for severe pain. 08/17/15   Merrily Pew, MD   Meds Ordered and Administered this Visit  Medications - No data to display  BP 136/87 mmHg  Pulse 88  Temp(Src) 98.7 F (37.1 C) (Oral)  Resp 12  SpO2 100% No data found.   Physical Exam NURSES NOTES AND VITAL SIGNS REVIEWED. CONSTITUTIONAL: Well developed, well nourished, no acute distress HEENT: normocephalic, atraumatic EYES: Conjunctiva normal NECK:normal ROM, supple, no adenopathy PULMONARY:No respiratory distress, normal effort, Tenderness left upper chest that is reproducible and feels exactly like the patient is experiencing at home. ABDOMINAL: Soft, ND, NT BS+, No CVAT MUSCULOSKELETAL: Normal ROM of all extremities,  SKIN: warm and dry without rash PSYCHIATRIC: Mood and affect, behavior are normal  ED Course  Procedures (including critical care time)  Labs Review Labs Reviewed - No data to display  Imaging Review Dg  Chest 2 View  02/19/2016  CLINICAL DATA:  Chest pain since this morning. Pain in the left chest and shoulder area. EXAM: CHEST  2 VIEW COMPARISON:  07/14/2015 FINDINGS: The heart size and mediastinal contours are within normal limits. Both lungs are clear. The visualized skeletal structures are unremarkable. IMPRESSION: No active cardiopulmonary disease. Electronically Signed   By: Rolm Baptise M.D.   On: 02/19/2016 14:48   I HAVE PERSONALLY   REVIEWED AND DISCUSSED RESULTS OF  X-RAYS WITH PATIENT  PRIOR TO DISCHARGE.     Visual Acuity Review  Right Eye Distance:   Left Eye Distance:   Bilateral Distance:    Right Eye Near:   Left Eye Near:    Bilateral Near:      I HAVE PERSONALLY REVIEWED ECG AND DISCUSSED FINDINGS WITH PATIENT.   PA UC INTERPRETATION: NORMAL SINUS RHYTHM WITH RATE OF 80, NO AXIS, OR INTERVAL ABNORMALITIES. NO ACUTE CHANGES.  NO OLD ECG TO COMPARE.  ECG UNCHANGED AS COMPARED TO:04/18/14    MDM   1. Chest pain of uncertain etiology     Patient is reassured that there are no issues that require transfer to higher level of care at this time or additional tests. Patient is advised to continue home symptomatic treatment. Patient is advised that if there are new or worsening symptoms to attend the emergency department, contact primary care provider, or return to UC. Instructions of care provided discharged home in stable condition.    THIS NOTE WAS GENERATED USING A VOICE RECOGNITION SOFTWARE PROGRAM. ALL REASONABLE EFFORTS  WERE MADE TO PROOFREAD THIS DOCUMENT FOR ACCURACY.  I have verbally reviewed the discharge instructions with the patient. A printed AVS was given to the patient.  All questions were answered prior to discharge.      Konrad Felix, PA 02/19/16 [phone]

## 2016-02-19 NOTE — Discharge Instructions (Signed)
Chest Wall Pain °Chest wall pain is pain in or around the bones and muscles of your chest. Sometimes, an injury causes this pain. Sometimes, the cause may not be known. This pain may take several weeks or longer to get better. °HOME CARE °Pay attention to any changes in your symptoms. Take these actions to help with your pain: °· Rest as told by your doctor. °· Avoid activities that cause pain. Try not to use your chest, belly (abdominal), or side muscles to lift heavy things. °· If directed, apply ice to the painful area: °¨ Put ice in a plastic bag. °¨ Place a towel between your skin and the bag. °¨ Leave the ice on for 20 minutes, 2-3 times per day. °· Take over-the-counter and prescription medicines only as told by your doctor. °· Do not use tobacco products, including cigarettes, chewing tobacco, and e-cigarettes. If you need help quitting, ask your doctor. °· Keep all follow-up visits as told by your doctor. This is important. °GET HELP IF: °· You have a fever. °· Your chest pain gets worse. °· You have new symptoms. °GET HELP RIGHT AWAY IF: °· You feel sick to your stomach (nauseous) or you throw up (vomit). °· You feel sweaty or light-headed. °· You have a cough with phlegm (sputum) or you cough up blood. °· You are short of breath. °  °This information is not intended to replace advice given to you by your health care provider. Make sure you discuss any questions you have with your health care provider. °  °Document Released: 03/04/2008 Document Revised: 06/07/2015 Document Reviewed: 12/12/2014 °Elsevier Interactive Patient Education ©2016 Elsevier Inc. ° °

## 2016-02-19 NOTE — ED Notes (Addendum)
Patient reports left sided nonradiating chest pain that started this am, constant in nature with diaphoresis and mild nausea. Denies sob. Patient reports pain increased with inspiration and movement.

## 2016-03-21 ENCOUNTER — Encounter: Payer: Self-pay | Admitting: *Deleted

## 2016-04-04 ENCOUNTER — Encounter: Payer: Self-pay | Admitting: Internal Medicine

## 2016-04-04 ENCOUNTER — Ambulatory Visit (INDEPENDENT_AMBULATORY_CARE_PROVIDER_SITE_OTHER): Payer: Medicaid Other | Admitting: Internal Medicine

## 2016-04-04 ENCOUNTER — Other Ambulatory Visit: Payer: Self-pay

## 2016-04-04 VITALS — BP 122/88 | HR 66 | Ht 73.0 in | Wt 215.0 lb

## 2016-04-04 DIAGNOSIS — I480 Paroxysmal atrial fibrillation: Secondary | ICD-10-CM

## 2016-04-04 MED ORDER — FLECAINIDE ACETATE 100 MG PO TABS: 100.0000 mg | ORAL_TABLET | Freq: Every day | ORAL | Status: DC | PRN

## 2016-04-04 MED ORDER — METOPROLOL TARTRATE 50 MG PO TABS: 50.0000 mg | ORAL_TABLET | Freq: Every day | ORAL | Status: DC | PRN

## 2016-04-04 NOTE — Progress Notes (Signed)
HPI Mr. Gene Anderson returns today for ongoing followup of atrial fibrillation. He is a pleasant 55 yo man with a h/o HTN and atrial fib with a RVR. The patient has not seen me for nearly 2 years. He notes that he has had episodes of atrial fib including one several weeks ago. He has tried to reduce his ETOH consumption. He is not very active as he has spinal stenosis. No syncope. No other complaints.   No Known Allergies   Current Outpatient Prescriptions  Medication Sig Dispense Refill  . diclofenac sodium (VOLTAREN) 1 % GEL Apply 2 g topically 4 (four) times daily as needed (For pain.).    Marland Kitchen naphazoline-pheniramine (NAPHCON-A) 0.025-0.3 % ophthalmic solution Place 1 drop into both eyes 2 (two) times daily as needed for irritation or allergies (USES IN THE MORNING).     . naproxen (NAPROSYN) 500 MG tablet Take 1 tablet by mouth 2 (two) times daily as needed. pain  5  . Oxycodone HCl 20 MG TABS Take 20 mg by mouth 4 (four) times daily as needed. For pain.  0  . flecainide (TAMBOCOR) 100 MG tablet Take 1 tablet (100 mg total) by mouth daily as needed. for palpitations 30 tablet 5  . metoprolol (LOPRESSOR) 50 MG tablet Take 1 tablet (50 mg total) by mouth daily as needed. for palpitations 30 tablet 5  . [DISCONTINUED] diltiazem (CARDIZEM CD) 300 MG 24 hr capsule Take 1 capsule (300 mg total) by mouth daily. (Patient not taking: Reported on 08/17/2015) 30 capsule 0   No current facility-administered medications for this visit.     Past Medical History  Diagnosis Date  . Spinal stenosis   . Prostate enlargement   . Fatty liver 06/20/13  . Internal hemorrhoids   . Diverticulosis   . Hyperplastic colon polyp   . Atrial fibrillation ([place])   . Leukopenia   . Neck pain     ROS:   All systems reviewed and negative except as noted in the HPI.   Past Surgical History  Procedure Laterality Date  . Flexible cystoscopy and repair of torn tunica albuginea    . Cardioversion  03/2014      Family History  Problem Relation Age of Onset  . Cancer Father   . Stroke Maternal Grandfather   . Cancer Paternal Grandmother      Social History   Social History  . Marital Status: Single    Spouse Name: N/A  . Number of Children: 4  . Years of Education: 14   Occupational History  .     Social History Main Topics  . Smoking status: Current Every Day Smoker -- 1.00 packs/day for 36 years    Types: Cigarettes  . Smokeless tobacco: Never Used  . Alcohol Use: Yes     Comment: social  . Drug Use: No  . Sexual Activity: Not on file   Other Topics Concern  . Not on file   Social History Narrative   Patient lives at home with his girl friend.   Patient is not working at this time.   Right handed.   Education college education.   Caffeine None     BP 122/88 mmHg  Pulse 66  Ht 6\' 1"  (1.854 m)  Wt 215 lb (97.523 kg)  BMI 28.37 kg/m2  Physical Exam:  Well appearing middle aged man, NAD HEENT: Unremarkable Neck:  No JVD, no thyromegally Back:  No CVA tenderness Lungs:  Clear with no wheezes  HEART:  Regular rate rhythm, no murmurs, no rubs, no clicks Abd:  soft, positive bowel sounds, no organomegally, no rebound, no guarding Ext:  2 plus pulses, no edema, no cyanosis, no clubbing Skin:  No rashes no nodules Neuro:  CN II through XII intact, motor grossly intact  EKG nsr  Assess/Plan: 1. PAF - the patient has very infrequent episodes. I have offered him a pill in the pocket with both metoprolol and flecainide.  2. ETOH - I have asked him to further reduce his ETOH consumption.  3. Spinal stenosis - stable. Will follow.  Mikle Bosworth.D.

## 2016-04-04 NOTE — Patient Instructions (Signed)
Medication Instructions:  Your physician has recommended you make the following change in your medication:  1) START Metoprolol 50 mg once daily as needed for palpitations. 2) START Flecainide 100 mg once daily as needed for palpitations.  Labwork: None ordered  Testing/Procedures: None ordered  Follow-Up: Your physician wants you to follow-up in: 1 year with Dr. Lovena Le.  You will receive a reminder letter in the mail two months in advance. If you don't receive a letter, please call our office to schedule the follow-up appointment.   If you need a refill on your cardiac medications before your next appointment, please call your pharmacy.  Thank you for choosing [facility]!!      Any Other Special Instructions Will Be Listed Below (If Applicable).  Flecainide tablets What is this medicine? FLECAINIDE (FLEK a nide) is an antiarrhythmic drug. This medicine is used to prevent irregular heart rhythm. It can also slow down fast heartbeats called tachycardia. This medicine may be used for other purposes; ask your health care provider or pharmacist if you have questions. What should I tell my health care provider before I take this medicine? They need to know if you have any of these conditions: -abnormal levels of potassium in the blood -heart disease including heart rhythm and heart rate problems -kidney or liver disease -recent heart attack -an unusual or allergic reaction to flecainide, local anesthetics, other medicines, foods, dyes, or preservatives -pregnant or trying to get pregnant -breast-feeding How should I use this medicine? Take this medicine by mouth with a glass of water. Follow the directions on the prescription label. You can take this medicine with or without food. Take your doses at regular intervals. Do not take your medicine more often than directed. Do not stop taking this medicine suddenly. This may cause serious, heart-related side effects. If your doctor  wants you to stop the medicine, the dose may be slowly lowered over time to avoid any side effects. Talk to your pediatrician regarding the use of this medicine in children. While this drug may be prescribed for children as young as 1 year of age for selected conditions, precautions do apply. Overdosage: If you think you have taken too much of this medicine contact a poison control center or emergency room at once. NOTE: This medicine is only for you. Do not share this medicine with others. What if I miss a dose? If you miss a dose, take it as soon as you can. If it is almost time for your next dose, take only that dose. Do not take double or extra doses. What may interact with this medicine? Do not take this medicine with any of the following medications: -amoxapine -arsenic trioxide -certain antibiotics like clarithromycin, erythromycin, gatifloxacin, gemifloxacin, levofloxacin, moxifloxacin, sparfloxacin, or troleandomycin -certain antidepressants called tricyclic antidepressants like amitriptyline, imipramine, or nortriptyline -certain medicines to control heart rhythm like disopyramide, dofetilide, encainide, moricizine, procainamide, propafenone, and quinidine -cisapride -cyclobenzaprine -delavirdine -droperidol -haloperidol -hawthorn -imatinib -levomethadyl -maprotiline -medicines for malaria like chloroquine and halofantrine -pentamidine -phenothiazines like chlorpromazine, mesoridazine, prochlorperazine, thioridazine -pimozide -quinine -ranolazine -ritonavir -sertindole -ziprasidone This medicine may also interact with the following medications: -cimetidine -medicines for angina or high blood pressure -medicines to control heart rhythm like amiodarone and digoxin This list may not describe all possible interactions. Give your health care provider a list of all the medicines, herbs, non-prescription drugs, or dietary supplements you use. Also tell them if you smoke, drink  alcohol, or use illegal drugs. Some items may interact with your  medicine. What should I watch for while using this medicine? Visit your doctor or health care professional for regular checks on your progress. Because your condition and the use of this medicine carries some risk, it is a good idea to carry an identification card, necklace or bracelet with details of your condition, medications and doctor or health care professional. Check your blood pressure and pulse rate regularly. Ask your health care professional what your blood pressure and pulse rate should be, and when you should contact him or her. Your doctor or health care professional also may schedule regular blood tests and electrocardiograms to check your progress. You may get drowsy or dizzy. Do not drive, use machinery, or do anything that needs mental alertness until you know how this medicine affects you. Do not stand or sit up quickly, especially if you are an older patient. This reduces the risk of dizzy or fainting spells. Alcohol can make you more dizzy, increase flushing and rapid heartbeats. Avoid alcoholic drinks. What side effects may I notice from receiving this medicine? Side effects that you should report to your doctor or health care professional as soon as possible: -chest pain, continued irregular heartbeats -difficulty breathing -swelling of the legs or feet -trembling, shaking -unusually weak or tired Side effects that usually do not require medical attention (report to your doctor or health care professional if they continue or are bothersome): -blurred vision -constipation -headache -nausea, vomiting -stomach pain This list may not describe all possible side effects. Call your doctor for medical advice about side effects. You may report side effects to FDA at 1-800-FDA-1088. Where should I keep my medicine? Keep out of the reach of children. Store at room temperature between 15 and 30 degrees C (59 and 86  degrees F). Protect from light. Keep container tightly closed. Throw away any unused medicine after the expiration date. NOTE: This sheet is a summary. It may not cover all possible information. If you have questions about this medicine, talk to your doctor, pharmacist, or health care provider.    2016, Elsevier/Gold Standard. (2008-01-20 16:46:09)   Metoprolol tablets What is this medicine? METOPROLOL (me TOE proe lole) is a beta-blocker. Beta-blockers reduce the workload on the heart and help it to beat more regularly. This medicine is used to treat high blood pressure and to prevent chest pain. It is also used to after a heart attack and to prevent an additional heart attack from occurring. This medicine may be used for other purposes; ask your health care provider or pharmacist if you have questions. What should I tell my health care provider before I take this medicine? They need to know if you have any of these conditions: -diabetes -heart or vessel disease like slow heart rate, worsening heart failure, heart block, sick sinus syndrome or Raynaud's disease -kidney disease -liver disease -lung or breathing disease, like asthma or emphysema -pheochromocytoma -thyroid disease -an unusual or allergic reaction to metoprolol, other beta-blockers, medicines, foods, dyes, or preservatives -pregnant or trying to get pregnant -breast-feeding How should I use this medicine? Take this medicine by mouth with a drink of water. Follow the directions on the prescription label. Take this medicine immediately after meals. Take your doses at regular intervals. Do not take more medicine than directed. Do not stop taking this medicine suddenly. This could lead to serious heart-related effects. Talk to your pediatrician regarding the use of this medicine in children. Special care may be needed. Overdosage: If you think you have taken too much  of this medicine contact a poison control center or emergency  room at once. NOTE: This medicine is only for you. Do not share this medicine with others. What if I miss a dose? If you miss a dose, take it as soon as you can. If it is almost time for your next dose, take only that dose. Do not take double or extra doses. What may interact with this medicine? This medicine may interact with the following medications: -certain medicines for blood pressure, heart disease, irregular heart beat -certain medicines for depression like monoamine oxidase (MAO) inhibitors, fluoxetine, or paroxetine -clonidine -dobutamine -epinephrine -isoproterenol -reserpine This list may not describe all possible interactions. Give your health care provider a list of all the medicines, herbs, non-prescription drugs, or dietary supplements you use. Also tell them if you smoke, drink alcohol, or use illegal drugs. Some items may interact with your medicine. What should I watch for while using this medicine? Visit your doctor or health care professional for regular check ups. Contact your doctor right away if your symptoms worsen. Check your blood pressure and pulse rate regularly. Ask your health care professional what your blood pressure and pulse rate should be, and when you should contact them. You may get drowsy or dizzy. Do not drive, use machinery, or do anything that needs mental alertness until you know how this medicine affects you. Do not sit or stand up quickly, especially if you are an older patient. This reduces the risk of dizzy or fainting spells. Contact your doctor if these symptoms continue. Alcohol may interfere with the effect of this medicine. Avoid alcoholic drinks. What side effects may I notice from receiving this medicine? Side effects that you should report to your doctor or health care professional as soon as possible: -allergic reactions like skin rash, itching or hives -cold or numb hands or feet -depression -difficulty breathing -faint -fever with sore  throat -irregular heartbeat, chest pain -rapid weight gain -swollen legs or ankles Side effects that usually do not require medical attention (report to your doctor or health care professional if they continue or are bothersome): -anxiety or nervousness -change in sex drive or performance -dry skin -headache -nightmares or trouble sleeping -short term memory loss -stomach upset or diarrhea -unusually tired This list may not describe all possible side effects. Call your doctor for medical advice about side effects. You may report side effects to FDA at 1-800-FDA-1088. Where should I keep my medicine? Keep out of the reach of children. Store at room temperature between 15 and 30 degrees C (59 and 86 degrees F). Throw away any unused medicine after the expiration date. NOTE: This sheet is a summary. It may not cover all possible information. If you have questions about this medicine, talk to your doctor, pharmacist, or health care provider.    2016, Elsevier/Gold Standard. (2013-05-21 14:40:36)

## 2016-07-08 ENCOUNTER — Ambulatory Visit (INDEPENDENT_AMBULATORY_CARE_PROVIDER_SITE_OTHER): Payer: Medicaid Other | Admitting: Specialist

## 2016-07-08 DIAGNOSIS — M48061 Spinal stenosis, lumbar region without neurogenic claudication: Secondary | ICD-10-CM | POA: Diagnosis not present

## 2016-07-08 DIAGNOSIS — M4316 Spondylolisthesis, lumbar region: Secondary | ICD-10-CM | POA: Diagnosis not present

## 2016-08-12 ENCOUNTER — Encounter (INDEPENDENT_AMBULATORY_CARE_PROVIDER_SITE_OTHER): Payer: Self-pay | Admitting: Specialist

## 2016-08-12 ENCOUNTER — Ambulatory Visit (INDEPENDENT_AMBULATORY_CARE_PROVIDER_SITE_OTHER): Payer: Medicaid Other | Admitting: Specialist

## 2016-08-12 VITALS — Ht 73.0 in | Wt 225.0 lb

## 2016-08-12 DIAGNOSIS — M5136 Other intervertebral disc degeneration, lumbar region: Secondary | ICD-10-CM

## 2016-08-12 DIAGNOSIS — M48062 Spinal stenosis, lumbar region with neurogenic claudication: Secondary | ICD-10-CM

## 2016-08-12 DIAGNOSIS — M4316 Spondylolisthesis, lumbar region: Secondary | ICD-10-CM

## 2016-08-12 DIAGNOSIS — M1711 Unilateral primary osteoarthritis, right knee: Secondary | ICD-10-CM

## 2016-08-12 MED ORDER — DICLOFENAC SODIUM 75 MG PO TBEC: 75.0000 mg | DELAYED_RELEASE_TABLET | Freq: Two times a day (BID) | ORAL | 4 refills | Status: DC

## 2016-08-12 NOTE — Progress Notes (Signed)
Office Visit Note   Patient: Gene Anderson           Date of Birth: [birth year]           MRN: [id number] Visit Date: 08/12/2016              Requested by: Darlyne Russian, MD [address] [place], [place] [address] PCP: Jenny Reichmann, MD   Assessment & Plan: Visit Diagnoses:  1. Spinal stenosis of lumbar region with neurogenic claudication   2. Spondylolisthesis, lumbar region   3. Lumbar degenerative disc disease   4. Unilateral primary osteoarthritis, right knee     Plan:Avoid bending, stooping and avoid lifting weights greater than 10 lbs. Avoid prolong standing and walking. Avoid frequent bending and stooping  No lifting greater than 10 lbs. May use ice or moist heat for pain. Weight loss is of benefit. Handicap license is approved.   Follow-Up Instructions: Return in about 4 weeks (around 09/09/2016) for Follow up of MRI and discussion of options for  treatments..   Orders:  Orders Placed This Encounter  Procedures  . MR Lumbar Spine w/o contrast   Meds ordered this encounter  Medications  . diclofenac (VOLTAREN) 75 MG EC tablet    Sig: Take 1 tablet (75 mg total) by mouth 2 (two) times daily.    Dispense:  180 tablet    Refill:  4      Procedures: No procedures performed   Clinical Data: Findings:  MRI 2015 DDD T12-L1 L3-4, L4-5 and L5-S1, Grade slip L4-5 with moderately severe stenosis, bilateral subarticular and lateral recess.     Subjective: Chief Complaint  Patient presents with  . Lower Back - Follow-up, Pain    Patient returns as a 4 week recheck on low back pain. Pain still in low back and radiates into bilateral legs. States he has numbness and tingling in bilateral legs also. Pain increases with bending, standing and walking. Still experiencing pain and difficulty with standing and walking. Improves with sitting and stooping and leaning. Pain is nearly all the time now. Last MRI was  2015. Wife is with him today to review studies and  discuss the cause and treatement. No Bowel or Bladder difficulties. Walking is limited to 100-150'. Sitting increases the pain and riding in  a car increases the pain, ride to [place].    Review of Systems  Constitutional: Negative for activity change, appetite change, chills, diaphoresis, fatigue, fever and unexpected weight change.  HENT: Negative.  Negative for congestion, dental problem and ear discharge.   Eyes: Positive for visual disturbance.  Respiratory: Negative.   Cardiovascular: Negative.   Gastrointestinal: Negative.   Endocrine: Negative.   Genitourinary: Negative.   Musculoskeletal: Positive for back pain, gait problem and joint swelling. Negative for neck pain and neck stiffness.  Skin: Negative.   Allergic/Immunologic: Negative for environmental allergies, food allergies and immunocompromised state.  Neurological: Positive for weakness and numbness. Negative for tremors, seizures and light-headedness.  Hematological: Negative.   Psychiatric/Behavioral: Negative.      Objective: Vital Signs: Ht 6\' 1"  (1.854 m)   Wt 225 lb (102.1 kg)   BMI 29.69 kg/m   Physical Exam  Constitutional: He is oriented to person, place, and time. He appears well-developed and well-nourished.  HENT:  Head: Normocephalic and atraumatic.  Eyes: EOM are normal. Pupils are equal, round, and reactive to light.  Neck: Normal range of motion. Neck supple.  Pulmonary/Chest: Effort normal and breath sounds normal.  Abdominal:  Soft. Bowel sounds are normal.  Musculoskeletal: He exhibits tenderness.  Neurological: He is alert and oriented to person, place, and time. He displays normal reflexes. No cranial nerve deficit. Coordination normal.  Skin: Skin is warm and dry.  Psychiatric: He has a normal mood and affect. His behavior is normal. Judgment and thought content normal.    Back Exam   Tenderness  The patient is experiencing tenderness in the lumbar.  Range of Motion    Extension: abnormal  Flexion: abnormal  Lateral Bend Right: abnormal  Lateral Bend Left: abnormal  Rotation Right: abnormal  Rotation Left: abnormal   Muscle Strength  Right Quadriceps:  4/5  Left Quadriceps:  4/5  Right Hamstrings:  4/5  Left Hamstrings:  4/5   Tests  Straight leg raise right: negative Straight leg raise left: negative  Reflexes  Patellar:  1/4 normal Achilles: Hyporeflexic Babinski's sign: normal   Other  Toe Walk: normal Heel Walk: normal Sensation: decreased Gait: abnormal  Scars: absent  Comments:  Decreased lordosis, decreased bending.        Specialty Comments:  No specialty comments available.  Imaging: No results found.   PMFS History: Patient Active Problem List   Diagnosis Date Noted  . Atrial fibrillation ([place]) 04/18/2014  . Paresthesia 02/16/2014  . Neck pain 02/16/2014  . Leukopenia 02/16/2013   Past Medical History:  Diagnosis Date  . Atrial fibrillation ([place])   . Diverticulosis   . Fatty liver 06/20/13  . Hyperplastic colon polyp   . Internal hemorrhoids   . Leukopenia   . Neck pain   . Prostate enlargement   . Spinal stenosis     Family History  Problem Relation Age of Onset  . Cancer Father   . Stroke Maternal Grandfather   . Cancer Paternal Grandmother     Past Surgical History:  Procedure Laterality Date  . CARDIOVERSION  03/2014  . Flexible cystoscopy and repair of torn tunica albuginea     Social History   Occupational History  .  [employer]   Social History Main Topics  . Smoking status: Current Every Day Smoker    Packs/day: 1.00    Years: 36.00    Types: Cigarettes  . Smokeless tobacco: Never Used  . Alcohol use Yes     Comment: social  . Drug use: No  . Sexual activity: Not on file

## 2016-08-12 NOTE — Patient Instructions (Signed)
Avoid bending, stooping and avoid lifting weights greater than 10 lbs. Avoid prolong standing and walking. Avoid frequent bending and stooping  No lifting greater than 10 lbs. May use ice or moist heat for pain. Weight loss is of benefit. Handicap license is approved.  

## 2016-08-20 ENCOUNTER — Telehealth (INDEPENDENT_AMBULATORY_CARE_PROVIDER_SITE_OTHER): Payer: Self-pay | Admitting: Specialist

## 2016-08-21 NOTE — Telephone Encounter (Signed)
See message below, please advise.

## 2016-08-21 NOTE — Telephone Encounter (Signed)
Please advise. Are you suppose to be witting letter or patient just need office note.

## 2016-08-21 NOTE — Telephone Encounter (Signed)
Tried to call patient no answer no vm picks up. Will advise once patient calls back  Thanks.

## 2016-08-21 NOTE — Telephone Encounter (Signed)
I have not written a letter and I would not write a letter after one visit declaring a person totally disabled. The office note may help him if there is an emminent meeting with his attorney or SSDI. SSDI normally sends a letter to the office requesting information directly from the office. They don't always accept information provided by delivery from the patient requesting the disability evaluation. jen

## 2016-08-25 ENCOUNTER — Other Ambulatory Visit: Payer: Medicaid Other

## 2016-08-27 NOTE — Telephone Encounter (Signed)
I think the office note should be sufficient. He has been applying for some time before seeing me one time. He has pathology and  It is apparent that he would have difficulty performing work gainfully. jen

## 2016-08-28 NOTE — Telephone Encounter (Signed)
Noted, called no answer and vm didn't not pick up just rings.  Thanks.

## 2016-09-01 ENCOUNTER — Other Ambulatory Visit: Payer: Medicaid Other

## 2016-10-22 ENCOUNTER — Other Ambulatory Visit (INDEPENDENT_AMBULATORY_CARE_PROVIDER_SITE_OTHER): Payer: Self-pay | Admitting: Specialist

## 2016-10-22 ENCOUNTER — Telehealth (INDEPENDENT_AMBULATORY_CARE_PROVIDER_SITE_OTHER): Payer: Self-pay | Admitting: *Deleted

## 2016-10-22 DIAGNOSIS — M48062 Spinal stenosis, lumbar region with neurogenic claudication: Secondary | ICD-10-CM

## 2016-10-22 DIAGNOSIS — M1711 Unilateral primary osteoarthritis, right knee: Secondary | ICD-10-CM

## 2016-10-22 MED ORDER — DICLOFENAC SODIUM 75 MG PO TBEC: 75.0000 mg | DELAYED_RELEASE_TABLET | Freq: Two times a day (BID) | ORAL | 4 refills | Status: DC

## 2016-10-22 NOTE — Telephone Encounter (Signed)
Mr Villalva is needing a prescription refill on his Diclofenac sodium 75mg 

## 2016-10-22 NOTE — Telephone Encounter (Signed)
Patient came in this morning in regards to needing a prescription refill on his medication Diclofenac sodium 75mg . Thank you

## 2016-10-22 NOTE — Telephone Encounter (Signed)
Patient is aware this was sent in

## 2016-10-22 NOTE — Telephone Encounter (Signed)
Rx sent to pharmacy for diclofenac electronically. jen

## 2016-11-28 ENCOUNTER — Ambulatory Visit (INDEPENDENT_AMBULATORY_CARE_PROVIDER_SITE_OTHER): Payer: Self-pay

## 2016-11-28 ENCOUNTER — Ambulatory Visit (INDEPENDENT_AMBULATORY_CARE_PROVIDER_SITE_OTHER): Payer: Medicaid Other | Admitting: Specialist

## 2016-11-28 ENCOUNTER — Encounter (INDEPENDENT_AMBULATORY_CARE_PROVIDER_SITE_OTHER): Payer: Self-pay | Admitting: Specialist

## 2016-11-28 ENCOUNTER — Ambulatory Visit (INDEPENDENT_AMBULATORY_CARE_PROVIDER_SITE_OTHER): Payer: Medicaid Other

## 2016-11-28 VITALS — BP 131/85 | HR 71 | Ht 73.0 in | Wt 225.0 lb

## 2016-11-28 DIAGNOSIS — M4316 Spondylolisthesis, lumbar region: Secondary | ICD-10-CM | POA: Diagnosis not present

## 2016-11-28 DIAGNOSIS — M5440 Lumbago with sciatica, unspecified side: Secondary | ICD-10-CM

## 2016-11-28 DIAGNOSIS — M170 Bilateral primary osteoarthritis of knee: Secondary | ICD-10-CM

## 2016-11-28 DIAGNOSIS — G8929 Other chronic pain: Secondary | ICD-10-CM

## 2016-11-28 DIAGNOSIS — M48062 Spinal stenosis, lumbar region with neurogenic claudication: Secondary | ICD-10-CM

## 2016-11-28 DIAGNOSIS — M25561 Pain in right knee: Secondary | ICD-10-CM | POA: Diagnosis not present

## 2016-11-28 DIAGNOSIS — M160 Bilateral primary osteoarthritis of hip: Secondary | ICD-10-CM | POA: Diagnosis not present

## 2016-11-28 NOTE — Progress Notes (Signed)
Office Visit Note   Patient: Gene Anderson           Date of Birth: 07-18-1961           MRN: [id number] Visit Date: 11/28/2016              Requested by: Darlyne Russian, MD [address] [place], [place] [address] PCP: Jenny Reichmann, MD   Assessment & Plan: Visit Diagnoses:  1. Chronic pain of right knee   2. Chronic midline low back pain with sciatica, sciatica laterality unspecified   3. Tricompartment osteoarthritis of knees, bilateral   4. Primary osteoarthritis of both hips   5. Spinal stenosis of lumbar region with neurogenic claudication   6. Spondylolisthesis of lumbar region     Plan: Avoid bending, stooping and avoid lifting weights greater than 10 lbs. Avoid prolong standing and walking. Avoid frequent bending and stooping  No lifting greater than 10 lbs. May use ice or moist heat for pain. Weight loss is of benefit. Handicap license is approved. Dr. Romona Curls secretary/Assistant will call to arrange for epidural steroid injection   Knee is suffering from osteoarthritis, only real proven treatments are Weight loss, NSIADs like diclofenac and exercise. Well padded shoes help. Ice the knee 2-3 times a day 15-20 mins at a time.    Follow-Up Instructions: Return in about 4 weeks (around 12/26/2016).   Orders:  Orders Placed This Encounter  Procedures  . XR Lumbar Spine 2-3 Views  . XR Knee 1-2 Views Right   No orders of the defined types were placed in this encounter.     Procedures: No procedures performed   Clinical Data: No additional findings.   Subjective: Chief Complaint  Patient presents with  . Lower Back - Follow-up    Gene Anderson is here to follow up on his back.  His MRI was denied due to not having 6 weeks of conservative treatment. He states that his back is not doing well. He has pain in his back and down the legs.  He is also having knee pain in both knees but his right knee is the worst, he had it drained abut 2 months ago.       Review of Systems  HENT: Negative.   Eyes: Negative.   Respiratory: Negative.   Cardiovascular: Negative.   Gastrointestinal: Negative.   Endocrine: Negative.   Genitourinary: Negative.   Musculoskeletal: Positive for back pain, gait problem and joint swelling.  Skin: Negative.   Allergic/Immunologic: Negative.   Neurological: Positive for weakness and numbness.  Hematological: Negative.   Psychiatric/Behavioral: Negative.      Objective: Vital Signs: BP 131/85 (BP Location: Left Arm, Patient Position: Sitting)   Pulse 71   Ht 6\' 1"  (1.854 m)   Wt 225 lb (102.1 kg)   BMI 29.69 kg/m   Physical Exam  Constitutional: He is oriented to person, place, and time. He appears well-developed and well-nourished.  HENT:  Head: Normocephalic and atraumatic.  Eyes: EOM are normal. Pupils are equal, round, and reactive to light.  Neck: Normal range of motion. Neck supple.  Pulmonary/Chest: Effort normal and breath sounds normal.  Abdominal: Soft. Bowel sounds are normal.  Neurological: He is alert and oriented to person, place, and time.  Skin: Skin is warm and dry.  Psychiatric: He has a normal mood and affect. His behavior is normal. Judgment and thought content normal.    Right Hip Exam   Range of Motion  Extension: abnormal  Flexion:  80 normal  Internal Rotation: 5  External Rotation:  30 normal  Abduction: abnormal  Adduction: abnormal   Muscle Strength  Abduction: 5/5  Adduction: 5/5  Flexion: 5/5   Tests  FABER: negative Ober: negative  Other  Erythema: absent Scars: absent Sensation: normal Pulse: present   Left Hip Exam   Range of Motion  Flexion: 120  Internal Rotation: 10 abnormal  External Rotation:  30 abnormal   Muscle Strength  Abduction: 5/5  Adduction: 5/5  Flexion: 5/5   Tests  FABER: negative Ober: negative  Other  Erythema: absent Scars: absent Sensation: normal Pulse: present   Back Exam   Tenderness  The patient  is experiencing tenderness in the lumbar.  Range of Motion  Extension: abnormal  Flexion: abnormal  Lateral Bend Right: normal  Lateral Bend Left: normal  Rotation Right: normal  Rotation Left: normal   Muscle Strength  Right Quadriceps:  5/5  Left Quadriceps:  5/5  Right Hamstrings:  5/5  Left Hamstrings:  5/5   Tests  Straight leg raise right: negative Straight leg raise left: negative  Reflexes  Patellar: normal Achilles: normal Babinski's sign: normal   Other  Toe Walk: normal Heel Walk: normal Sensation: normal Gait: normal       Specialty Comments:  No specialty comments available.  Imaging: Xr Knee 1-2 Views Right  Result Date: 11/28/2016 Bilateral standing knees and lateral right knee show narrowing of the medial greater than lateral knee joint line with narrowing of the intercondylar notch, mild osteophytes off the medial and lateral joint lines. Lateral radiograph with scalloping of the posterior right patella articular surface with subchondral sclerosis. Findings consistent with moderate osteoarthritis of the right knee. The left knee with mild DJD. Varus deformity of the right knee 4 degrees, left knee with neutral varus/valgus appearance.     PMFS History: Patient Active Problem List   Diagnosis Date Noted  . Atrial fibrillation ([place]) 04/18/2014  . Paresthesia 02/16/2014  . Neck pain 02/16/2014  . Leukopenia 02/16/2013   Past Medical History:  Diagnosis Date  . Atrial fibrillation ([place])   . Diverticulosis   . Fatty liver 06/20/13  . Hyperplastic colon polyp   . Internal hemorrhoids   . Leukopenia   . Neck pain   . Prostate enlargement   . Spinal stenosis     Family History  Problem Relation Age of Onset  . Cancer Father   . Stroke Maternal Grandfather   . Cancer Paternal Grandmother     Past Surgical History:  Procedure Laterality Date  . CARDIOVERSION  03/2014  . Flexible cystoscopy and repair of torn tunica albuginea     Social  History   Occupational History  .  [employer]   Social History Main Topics  . Smoking status: Current Every Day Smoker    Packs/day: 1.00    Years: 36.00    Types: Cigarettes  . Smokeless tobacco: Never Used  . Alcohol use Yes     Comment: social  . Drug use: No  . Sexual activity: Not on file

## 2016-11-28 NOTE — Patient Instructions (Signed)
Avoid bending, stooping and avoid lifting weights greater than 10 lbs. Avoid prolong standing and walking. Avoid frequent bending and stooping  No lifting greater than 10 lbs. May use ice or moist heat for pain. Weight loss is of benefit. Handicap license is approved. Dr. Romona Curls secretary/Assistant will call to arrange for epidural steroid injection   Knee is suffering from osteoarthritis, only real proven treatments are Weight loss, NSIADs like diclofenac and exercise. Well padded shoes help. Ice the knee 2-3 times a day 15-20 mins at a time.

## 2016-12-04 ENCOUNTER — Telehealth (INDEPENDENT_AMBULATORY_CARE_PROVIDER_SITE_OTHER): Payer: Self-pay | Admitting: Radiology

## 2016-12-04 NOTE — Telephone Encounter (Signed)
Patient states that Dr. Louanne Skye was going to do a letter for [employer], he came by to pick it up and there was nothing noted in the last OV note about this and no letter.  Can you please advise?

## 2016-12-09 ENCOUNTER — Encounter (INDEPENDENT_AMBULATORY_CARE_PROVIDER_SITE_OTHER): Payer: Medicaid Other | Admitting: Physical Medicine and Rehabilitation

## 2016-12-10 ENCOUNTER — Telehealth (INDEPENDENT_AMBULATORY_CARE_PROVIDER_SITE_OTHER): Payer: Self-pay | Admitting: Specialist

## 2016-12-10 ENCOUNTER — Encounter (INDEPENDENT_AMBULATORY_CARE_PROVIDER_SITE_OTHER): Payer: Self-pay | Admitting: Specialist

## 2016-12-10 NOTE — Telephone Encounter (Signed)
I tried to call pt to let him know that his note would be ready after 830 on Wednesday morning--but no answer and VM was full-

## 2016-12-10 NOTE — Telephone Encounter (Signed)
Patient calling to check on the status of the letter to [employer].  He has an appointment with them tomorrow @ 9:45.   Please call.

## 2016-12-11 ENCOUNTER — Ambulatory Visit (HOSPITAL_COMMUNITY)
Admission: EM | Admit: 2016-12-11 | Discharge: 2016-12-11 | Disposition: A | Discharge status: Home / Self Care | Payer: Medicaid Other | Attending: Family Medicine | Admitting: Family Medicine

## 2016-12-11 ENCOUNTER — Encounter (HOSPITAL_COMMUNITY): Payer: Self-pay | Admitting: *Deleted

## 2016-12-11 DIAGNOSIS — M79671 Pain in right foot: Secondary | ICD-10-CM

## 2016-12-11 NOTE — Telephone Encounter (Signed)
done

## 2016-12-11 NOTE — Telephone Encounter (Signed)
Patient is aware that letter is ready for pick up--- I printed and put it at the front desk

## 2016-12-11 NOTE — Discharge Instructions (Signed)
Obtain a doughnut shaped foam pads to place over the area of pain to place the weight around the sore area If you develop increased swelling, redness, increased pain or other evidence of infection or worsening seek medical treatment promptly.

## 2016-12-11 NOTE — ED Triage Notes (Signed)
Pt  Reports  r  Foot is   Swollen  With  Pain in  Bottom  Of    r  Foot      Pt  Reports  Had  r  Knee  Injected  6  Days    Ago      Denies  Any  specefic  Injury

## 2016-12-11 NOTE — ED Provider Notes (Signed)
CSN: [id number]     Arrival date & time 12/11/16  1252 History   First MD Initiated Contact with Patient 12/11/16 1424     Chief Complaint  Patient presents with  . Foot Problem   (Consider location/radiation/quality/duration/timing/severity/associated sxs/prior Treatment) 56 year old male complaining of right forefoot pain in the plantar aspect gradually developing since Sunday or 3 days ago. There is a small area in the middle of the foot at the base of the third toe where most of the weight is placed when standing and walking. Denies pain to the dorsum of the foot. Denies drainage, bleeding or seeing any other lesions. Denies injury or trauma to the foot.      Past Medical History:  Diagnosis Date  . Atrial fibrillation ([place])   . Diverticulosis   . Fatty liver 06/20/13  . Hyperplastic colon polyp   . Internal hemorrhoids   . Leukopenia   . Neck pain   . Prostate enlargement   . Spinal stenosis    Past Surgical History:  Procedure Laterality Date  . CARDIOVERSION  03/2014  . Flexible cystoscopy and repair of torn tunica albuginea     Family History  Problem Relation Age of Onset  . Cancer Father   . Stroke Maternal Grandfather   . Cancer Paternal Grandmother    Social History  Substance Use Topics  . Smoking status: Current Every Day Smoker    Packs/day: 1.00    Years: 36.00    Types: Cigarettes  . Smokeless tobacco: Never Used  . Alcohol use Yes     Comment: social    Review of Systems  Constitutional: Negative.   Musculoskeletal: Negative.   Skin: Negative for color change and rash.       As per history of present illness.  Neurological: Negative.   All other systems reviewed and are negative.   Allergies  Patient has no known allergies.  Home Medications   Prior to Admission medications   Medication Sig Start Date End Date Taking? Authorizing Provider  diclofenac (VOLTAREN) 75 MG EC tablet Take 1 tablet (75 mg total) by mouth 2 (two) times daily.  10/22/16   Jessy Oto, MD  diclofenac sodium (VOLTAREN) 1 % GEL Apply 2 g topically 4 (four) times daily as needed (For pain.).    Historical Provider, MD  flecainide (TAMBOCOR) 100 MG tablet Take 1 tablet (100 mg total) by mouth daily as needed. for palpitations 04/04/16   Evans Lance, MD  metoprolol (LOPRESSOR) 50 MG tablet Take 1 tablet (50 mg total) by mouth daily as needed. for palpitations 04/04/16   Evans Lance, MD  naphazoline-pheniramine (NAPHCON-A) 0.025-0.3 % ophthalmic solution Place 1 drop into both eyes 2 (two) times daily as needed for irritation or allergies (USES IN THE MORNING).     Historical Provider, MD  naproxen (NAPROSYN) 500 MG tablet Take 1 tablet by mouth 2 (two) times daily as needed. pain 02/03/16   Historical Provider, MD  Oxycodone HCl 20 MG TABS Take 20 mg by mouth 4 (four) times daily as needed. For pain. 08/10/15   Historical Provider, MD   Meds Ordered and Administered this Visit  Medications - No data to display  BP 140/80 (BP Location: Right Arm)   Temp 98.6 F (37 C) (Oral)   Resp 14   SpO2 100%  No data found.   Physical Exam  Constitutional: He is oriented to person, place, and time. He appears well-developed and well-nourished. No distress.  Neck: Neck  supple.  Cardiovascular: Normal rate.   Pulmonary/Chest: Effort normal.  Musculoskeletal: Normal range of motion. He exhibits no edema or deformity.  Neurological: He is alert and oriented to person, place, and time.  Skin: Skin is warm and dry.  Plantar aspect of the forefoot proximal to the third toe there is an area of tenderness. There is minimal surrounding swelling. No erythema or other discoloration. There is no wound/open wound. No nodules or foreign bodies are palpated or seen under magnification. No tenderness to the bones of the foot or dorsal aspect. No tenderness to other areas of the plantar foot.  Psychiatric: He has a normal mood and affect.  Nursing note and vitals  reviewed.   Urgent Care Course     Procedures (including critical care time)  Labs Review Labs Reviewed - No data to display  Imaging Review No results found.   Visual Acuity Review  Right Eye Distance:   Left Eye Distance:   Bilateral Distance:    Right Eye Near:   Left Eye Near:    Bilateral Near:         MDM   1. Right foot pain    This may be the result result of a corn. Other possibility is a very small foreign body. As stated above nothing can be seen or palpated as a foreign body and the pain prevents deep palpation to assess for a "corn". Obtain a doughnut shaped foam pads to place over the area of pain to place the weight around the sore area If you develop increased swelling, redness, increased pain or other evidence of infection or worsening seek medical treatment promptly. Follow-up with the directors to send and she can.    Janne Napoleon, NP 12/11/16 1450

## 2016-12-12 ENCOUNTER — Ambulatory Visit (INDEPENDENT_AMBULATORY_CARE_PROVIDER_SITE_OTHER): Payer: Medicaid Other

## 2016-12-12 ENCOUNTER — Ambulatory Visit (INDEPENDENT_AMBULATORY_CARE_PROVIDER_SITE_OTHER): Payer: Medicaid Other | Admitting: Physical Medicine and Rehabilitation

## 2016-12-12 ENCOUNTER — Encounter (INDEPENDENT_AMBULATORY_CARE_PROVIDER_SITE_OTHER): Payer: Self-pay | Admitting: Physical Medicine and Rehabilitation

## 2016-12-12 VITALS — BP 145/94 | HR 73

## 2016-12-12 DIAGNOSIS — M5416 Radiculopathy, lumbar region: Secondary | ICD-10-CM

## 2016-12-12 MED ORDER — METHYLPREDNISOLONE ACETATE 80 MG/ML IJ SUSP
80.0000 mg | Freq: Once | INTRAMUSCULAR | Status: AC
  Administered 2016-12-12: 80 mg

## 2016-12-12 NOTE — Patient Instructions (Signed)

## 2016-12-12 NOTE — Telephone Encounter (Signed)
Patient advised letter done and ready for pick up yesterday

## 2016-12-12 NOTE — Progress Notes (Signed)
Gene Anderson - 56 y.o. male MRN [id number]  Date of birth: 05/17/1961  Office Visit Note: Visit Date: 12/12/2016 PCP: Jenny Reichmann, MD Referred by: Darlyne Russian, MD  Subjective: Chief Complaint  Patient presents with  . Lower Back - Pain   HPI: Gene Anderson is a 56 year old gentleman with chronic worsening severe pain across lower back. Worse on left side. Radiates down back of both legs to knees. Constant pain. Doesn't relate to any certain movement or position. Numbness and tingling right foot. Some leg weakness.     ROS Otherwise per HPI.  Assessment & Plan: Visit Diagnoses:  1. Lumbar radiculopathy     Plan: Findings:  Midline L4-5 interlaminar epidural steroid injection.    Meds & Orders:  Meds ordered this encounter  Medications  . methylPREDNISolone acetate (DEPO-MEDROL) injection 80 mg    Orders Placed This Encounter  Procedures  . XR C-ARM NO REPORT  . Epidural Steroid injection    Follow-up: Return for Dr. Louanne Skye.   Procedures: No procedures performed  Lumbar Epidural Steroid Injection - Interlaminar Approach with Fluoroscopic Guidance  Patient: Gene Anderson      Date of Birth: Nov 22, 1960 MRN: [id number] PCP: Jenny Reichmann, MD      Visit Date: 12/12/2016   Universal Protocol:    Date/Time: 03/19/185:51 AM  Consent Given By: the patient  Position: PRONE  Additional Comments: Vital signs were monitored before and after the procedure. Patient was prepped and draped in the usual sterile fashion. The correct patient, procedure, and site was verified.   Injection Procedure Details:  Procedure Site One Meds Administered:  Meds ordered this encounter  Medications  . methylPREDNISolone acetate (DEPO-MEDROL) injection 80 mg     Laterality: Left  Location/Site:  L4-L5  Needle size: 20 G  Needle type: Tuohy  Needle Placement: Paramedian epidural  Findings:  -Contrast Used: 1 mL iohexol 180 mg iodine/mL   -Comments: Excellent flow  of contrast into the epidural space.  Procedure Details: Using a paramedian approach from the side mentioned above, the region overlying the inferior lamina was localized under fluoroscopic visualization and the soft tissues overlying this structure were infiltrated with 4 ml. of 1% Lidocaine without Epinephrine. The Tuohy needle was inserted into the epidural space using a paramedian approach.   The epidural space was localized using loss of resistance along with lateral and bi-planar fluoroscopic views.  After negative aspirate for air, blood, and CSF, a 2 ml. volume of Isovue-250 was injected into the epidural space and the flow of contrast was observed. Radiographs were obtained for documentation purposes.    The injectate was administered into the level noted above.   Additional Comments:  No complications occurred Dressing: Band-Aid    Post-procedure details: Patient was observed during the procedure. Post-procedure instructions were reviewed.  Patient left the clinic in stable condition.    Clinical History: No specialty comments available.  He reports that he has been smoking Cigarettes.  He has a 36.00 pack-year smoking history. He has never used smokeless tobacco. No results for input(s): HGBA1C, LABURIC in the last 8760 hours.  Objective:  VS:  HT:    WT:   BMI:     BP:(!) 145/94  HR:73bpm  TEMP: ( )  RESP:99 % Physical Exam  Musculoskeletal:  Patient has good distal strength without clonus.    Ortho Exam Imaging: No results found.  Past Medical/Family/Surgical/Social History: Medications & Allergies reviewed per EMR Patient Active Problem List  Diagnosis Date Noted  . Atrial fibrillation ([place]) 04/18/2014  . Paresthesia 02/16/2014  . Neck pain 02/16/2014  . Leukopenia 02/16/2013   Past Medical History:  Diagnosis Date  . Atrial fibrillation ([place])   . Diverticulosis   . Fatty liver 06/20/13  . Hyperplastic colon polyp   . Internal hemorrhoids   .  Leukopenia   . Neck pain   . Prostate enlargement   . Spinal stenosis    Family History  Problem Relation Age of Onset  . Cancer Father   . Stroke Maternal Grandfather   . Cancer Paternal Grandmother    Past Surgical History:  Procedure Laterality Date  . CARDIOVERSION  03/2014  . Flexible cystoscopy and repair of torn tunica albuginea     Social History   Occupational History  .  [employer]   Social History Main Topics  . Smoking status: Current Every Day Smoker    Packs/day: 1.00    Years: 36.00    Types: Cigarettes  . Smokeless tobacco: Never Used  . Alcohol use Yes     Comment: social  . Drug use: No  . Sexual activity: Not on file

## 2016-12-16 NOTE — Procedures (Signed)
Lumbar Epidural Steroid Injection - Interlaminar Approach with Fluoroscopic Guidance  Patient: Gene Anderson      Date of Birth: 1961-01-16 MRN: [id number] PCP: Jenny Reichmann, MD      Visit Date: 12/12/2016   Universal Protocol:    Date/Time: 03/19/185:51 AM  Consent Given By: the patient  Position: PRONE  Additional Comments: Vital signs were monitored before and after the procedure. Patient was prepped and draped in the usual sterile fashion. The correct patient, procedure, and site was verified.   Injection Procedure Details:  Procedure Site One Meds Administered:  Meds ordered this encounter  Medications  . methylPREDNISolone acetate (DEPO-MEDROL) injection 80 mg     Laterality: Left  Location/Site:  L4-L5  Needle size: 20 G  Needle type: Tuohy  Needle Placement: Paramedian epidural  Findings:  -Contrast Used: 1 mL iohexol 180 mg iodine/mL   -Comments: Excellent flow of contrast into the epidural space.  Procedure Details: Using a paramedian approach from the side mentioned above, the region overlying the inferior lamina was localized under fluoroscopic visualization and the soft tissues overlying this structure were infiltrated with 4 ml. of 1% Lidocaine without Epinephrine. The Tuohy needle was inserted into the epidural space using a paramedian approach.   The epidural space was localized using loss of resistance along with lateral and bi-planar fluoroscopic views.  After negative aspirate for air, blood, and CSF, a 2 ml. volume of Isovue-250 was injected into the epidural space and the flow of contrast was observed. Radiographs were obtained for documentation purposes.    The injectate was administered into the level noted above.   Additional Comments:  No complications occurred Dressing: Band-Aid    Post-procedure details: Patient was observed during the procedure. Post-procedure instructions were reviewed.  Patient left the clinic in stable  condition.

## 2016-12-17 ENCOUNTER — Encounter: Payer: Self-pay | Admitting: Podiatry

## 2016-12-17 ENCOUNTER — Ambulatory Visit (INDEPENDENT_AMBULATORY_CARE_PROVIDER_SITE_OTHER): Payer: Medicaid Other | Admitting: Podiatry

## 2016-12-17 VITALS — BP 148/92 | HR 79 | Resp 16 | Ht 73.5 in | Wt 220.0 lb

## 2016-12-17 DIAGNOSIS — L02619 Cutaneous abscess of unspecified foot: Secondary | ICD-10-CM | POA: Diagnosis not present

## 2016-12-17 DIAGNOSIS — L03119 Cellulitis of unspecified part of limb: Secondary | ICD-10-CM | POA: Diagnosis not present

## 2016-12-17 MED ORDER — AMOXICILLIN-POT CLAVULANATE 875-125 MG PO TABS: 1.0000 | ORAL_TABLET | Freq: Two times a day (BID) | ORAL | 0 refills | Status: DC

## 2016-12-17 NOTE — Progress Notes (Signed)
Subjective:     Patient ID: Gene Anderson, male   DOB: 02/10/1961, 56 y.o.   MRN: [id number]  HPI patient presents stating he has a lump on the bottom of his right foot and he thinks he may have stepped on something but it's been very sore and making it hard for him to walk. He thought he got some pus out of the earlier but it did not   Review of Systems  All other systems reviewed and are negative.      Objective:   Physical Exam  Constitutional: He is oriented to person, place, and time.  Cardiovascular: Intact distal pulses.   Musculoskeletal: Normal range of motion.  Neurological: He is oriented to person, place, and time.  Skin: Skin is warm.  Nursing note and vitals reviewed.  neurovascular status intact muscle strength adequate range of motion was within normal limits with patient found to have 3 cm x 3 cm enlargement of the head of the second metatarsal that has fluid within it and is quite tender when palpated. No proximal edema erythema drainage noted patient's temperature was normal and no systemic signs of infection     Assessment:     Appears to be an abscess of the right plantar foot within the second metatarsal with no proximal spread noted currently    Plan:     H&P condition reviewed. Today using sharp sterile instrumentation I did open it and found there to be a purulent pus consistent with probable staph infection. I was able to aspirate and get rid of all pus and I did do a culture of this and sent it off and placed him on Augmentin 875 mg twice a day gave instructions on soaks and padding of the area and strict instructions if he should develop any systemic signs of infection he is to go straight to the emergency room. He will be seen back in 1 week to recheck

## 2016-12-17 NOTE — Progress Notes (Signed)
   Subjective:    Patient ID: Gene Anderson, male    DOB: May 12, 1961, 56 y.o.   MRN: [id number]  HPI Chief Complaint  Patient presents with  . Painful lesion    Right foot; plantar forefoot-below great toe and 2nd toe; pt stated, "Thinks boots have caused lesion"      Review of Systems  Musculoskeletal: Positive for back pain, gait problem and myalgias.  All other systems reviewed and are negative.      Objective:   Physical Exam        Assessment & Plan:

## 2016-12-24 ENCOUNTER — Telehealth: Payer: Self-pay | Admitting: *Deleted

## 2016-12-24 NOTE — Telephone Encounter (Addendum)
Gene Anderson states they have an question concerning the collection date. 12/24/2016-I informed [name] of the 12/17/2016 collection date. [name] called prior to my 3:16pm call and stated the specimen was passed useability.12/25/2016-I spoke with pt and he said that he could still feel something was in there, but he would be at his appt on Friday. I informed pt his appt was Friday 12/27/2016 at 12:15pm.

## 2016-12-25 NOTE — Telephone Encounter (Signed)
I would check patient is doing ok. I'm assuming they will not be running culture

## 2016-12-26 ENCOUNTER — Ambulatory Visit: Payer: Medicaid Other | Admitting: Podiatry

## 2016-12-27 ENCOUNTER — Encounter: Payer: Self-pay | Admitting: Podiatry

## 2016-12-27 ENCOUNTER — Ambulatory Visit (INDEPENDENT_AMBULATORY_CARE_PROVIDER_SITE_OTHER): Payer: Medicaid Other | Admitting: Podiatry

## 2016-12-27 DIAGNOSIS — L03119 Cellulitis of unspecified part of limb: Secondary | ICD-10-CM

## 2016-12-27 DIAGNOSIS — L02619 Cutaneous abscess of unspecified foot: Secondary | ICD-10-CM

## 2016-12-30 NOTE — Progress Notes (Signed)
Subjective:     Patient ID: Gene Anderson, male   DOB: 14-Jun-1961, 56 y.o.   MRN: [id number]  HPI patient presents stating that the bottom of the right foot is feeling a lot better with minimal discomfort or swelling and that he's able to walk distances on it now. No systemic indications of infection   Review of Systems     Objective:   Physical Exam Neurovascular status found to be intact muscle strength and range of motion was adequate and patient was noted to have what appears to be well-healing plantar area secondary to infection he had developed from foreign body trauma. There is no proximal edema erythema drainage noted and there is no drainage noted from anywhere around the original trauma area    Assessment:     Patient appears to be doing well post infection plantar right foot with no proximal edema erythema drainage noted and no current drainage where the original injury occur    Plan:     Reviewed condition and recommended soaks for the area and explained if there is an abscess it may become irritated again and it's possible this require open surgery if it were to become a problem. At this time is no pain no drainage sore just can put on a watch and is given strict instructions to come in as needed. I did explain that the original culture was messed up by the lab and they were not able to run it so we do not know if there was any bacteria but given his response it appears that there is no problem at the current time

## 2017-01-02 ENCOUNTER — Ambulatory Visit (INDEPENDENT_AMBULATORY_CARE_PROVIDER_SITE_OTHER): Payer: Medicaid Other | Admitting: Specialist

## 2017-06-11 ENCOUNTER — Ambulatory Visit: Payer: Self-pay | Admitting: Podiatry

## 2017-06-12 ENCOUNTER — Encounter (INDEPENDENT_AMBULATORY_CARE_PROVIDER_SITE_OTHER): Payer: Self-pay

## 2017-06-12 ENCOUNTER — Ambulatory Visit (INDEPENDENT_AMBULATORY_CARE_PROVIDER_SITE_OTHER): Payer: Medicaid Other | Admitting: Specialist

## 2017-06-18 ENCOUNTER — Encounter (INDEPENDENT_AMBULATORY_CARE_PROVIDER_SITE_OTHER): Payer: Self-pay | Admitting: Specialist

## 2017-06-18 ENCOUNTER — Ambulatory Visit (INDEPENDENT_AMBULATORY_CARE_PROVIDER_SITE_OTHER): Payer: Medicaid Other | Admitting: Specialist

## 2017-06-18 VITALS — BP 135/87 | HR 70 | Ht 73.0 in | Wt 208.0 lb

## 2017-06-18 DIAGNOSIS — G5622 Lesion of ulnar nerve, left upper limb: Secondary | ICD-10-CM | POA: Diagnosis not present

## 2017-06-18 DIAGNOSIS — M1712 Unilateral primary osteoarthritis, left knee: Secondary | ICD-10-CM | POA: Diagnosis not present

## 2017-06-18 DIAGNOSIS — M6250 Muscle wasting and atrophy, not elsewhere classified, unspecified site: Secondary | ICD-10-CM | POA: Diagnosis not present

## 2017-06-18 DIAGNOSIS — M4722 Other spondylosis with radiculopathy, cervical region: Secondary | ICD-10-CM

## 2017-06-18 MED ORDER — GABAPENTIN 300 MG PO CAPS: ORAL_CAPSULE | ORAL | 3 refills | Status: DC

## 2017-06-18 NOTE — Progress Notes (Signed)
Office Visit Note   Patient: Gene Anderson           Date of Birth: 1961/07/11           MRN: [id number] Visit Date: 06/18/2017              Requested by: Darlyne Russian, MD [address] [place], [place] [address] PCP: Darlyne Russian, MD   Assessment & Plan: Visit Diagnoses:  1. Unilateral primary osteoarthritis, left knee   2. Cubital tunnel syndrome on left   3. Muscular atrophy, unspecified site   4. Other spondylosis with radiculopathy, cervical region     Plan: Avoid overhead lifting and overhead use of the arms. Do not lift greater than 5 lbs. Adjust head rest in vehicle to prevent hyperextension if rear ended. Try to avoid injury to the area of the funny bone or back of the elbow. Avoid flexion of the left elbow as this increases the pressure on the nerve at the left elbow. Dr. Romona Curls secretary will contact you to arrange for EMGs/NCV testing of the left arm to determine if the nerve is pinched at either the neck or the left elbow or both. Zotrix cream applied BID to the areas of numbness and tingling.  Follow-Up Instructions: Return in about 3 weeks (around 07/09/2017).   Orders:  No orders of the defined types were placed in this encounter.  No orders of the defined types were placed in this encounter.     Procedures: No procedures performed   Clinical Data: No additional findings.   Subjective: Chief Complaint  Patient presents with  . Lower Back - Follow-up    56 year old male with 2 month history of left hand little finger numbness, seen by primary care MD and was scheduled for MRI of the cervical spine at [facility] He is scheduled For MRI tomorrow. He in having occasional cramps and stiffness into the left neck no scapula pain. Pain radiates down the left posterior arm and posterior forearm into the left arm and little finger. The left hand numbness never stops, like a tight rubber band on the left hand. He has stiffness  with standing and walking. Has left knee osteoarthritis, difficulty with pain into both knees with standing and walking. Pain with up and down stairs and avoids kneeling or squatting. AM stiffness using heavy shoes with difficulty bending the knees. Driving is okay except he has to  Keep knee bent and this worsens the left knee pain.     Review of Systems  Constitutional: Negative.   HENT: Negative.   Eyes: Negative.   Respiratory: Negative.   Cardiovascular: Negative.   Gastrointestinal: Negative.   Endocrine: Negative.   Genitourinary: Negative.   Musculoskeletal: Negative.   Skin: Negative.   Allergic/Immunologic: Negative.   Neurological: Negative.   Hematological: Negative.   Psychiatric/Behavioral: Negative.      Objective: Vital Signs: BP 135/87 (BP Location: Left Arm, Patient Position: Sitting)   Pulse 70   Ht 6\' 1"  (1.854 m)   Wt 208 lb (94.3 kg)   BMI 27.44 kg/m   Physical Exam  Constitutional: He is oriented to person, place, and time. He appears well-developed and well-nourished. No distress.  HENT:  Head: Normocephalic and atraumatic.  Eyes: Pupils are equal, round, and reactive to light. EOM are normal. Right eye exhibits no discharge. Left eye exhibits no discharge.  Neck: Normal range of motion. Neck supple. No JVD  present. No tracheal deviation present. No thyromegaly present.  Pulmonary/Chest: Effort normal and breath sounds normal.  Abdominal: Soft. Bowel sounds are normal. He exhibits no distension. There is no tenderness.  Lymphadenopathy:    He has no cervical adenopathy.  Neurological: He is alert and oriented to person, place, and time.  Skin: Skin is warm and dry. No rash noted. He is not diaphoretic. No erythema. No pallor.  Psychiatric: He has a normal mood and affect. His behavior is normal. Judgment and thought content normal.    Back Exam   Tenderness  The patient is experiencing tenderness in the cervical.  Range of Motion  Extension:   80 abnormal  Flexion:  70 abnormal  Lateral Bend Right: abnormal  Lateral Bend Left: abnormal  Rotation Right: abnormal  Rotation Left: abnormal   Muscle Strength  Right Quadriceps:  5/5  Left Quadriceps:  5/5  Right Hamstrings:  5/5  Left Hamstrings:  5/5   Tests  Straight leg raise right: negative Straight leg raise left: negative  Reflexes  Patellar: normal Achilles: normal Biceps: normal Babinski's sign: normal   Other  Toe Walk: abnormal Heel Walk: abnormal Sensation: decreased Gait: normal   Comments:  Positive hyperflexion test left elbow and positive Tinels sign left elbow. Weak intrinsics left hand with atrophy left thumb index web space.  Triceps strength is normal       Specialty Comments:  No specialty comments available.  Imaging: No results found.   PMFS History: Patient Active Problem List   Diagnosis Date Noted  . Atrial fibrillation ([place]) 04/18/2014  . Paresthesia 02/16/2014  . Neck pain 02/16/2014  . Leukopenia 02/16/2013   Past Medical History:  Diagnosis Date  . Atrial fibrillation ([place])   . Diverticulosis   . Fatty liver 06/20/13  . Hyperplastic colon polyp   . Internal hemorrhoids   . Leukopenia   . Neck pain   . Prostate enlargement   . Spinal stenosis     Family History  Problem Relation Age of Onset  . Cancer Father   . Stroke Maternal Grandfather   . Cancer Paternal Grandmother     Past Surgical History:  Procedure Laterality Date  . CARDIOVERSION  03/2014  . Flexible cystoscopy and repair of torn tunica albuginea     Social History   Occupational History  .  [employer]   Social History Main Topics  . Smoking status: Current Every Day Smoker    Packs/day: 1.00    Years: 36.00    Types: Cigarettes  . Smokeless tobacco: Never Used  . Alcohol use Yes     Comment: social  . Drug use: No  . Sexual activity: Not on file

## 2017-06-18 NOTE — Patient Instructions (Addendum)
Avoid overhead lifting and overhead use of the arms. Do not lift greater than 5 lbs. Adjust head rest in vehicle to prevent hyperextension if rear ended. Try to avoid injury to the area of the funny bone or back of the elbow. Avoid flexion of the left elbow as this increases the pressure on the nerve at the left elbow. Dr. Romona Curls secretary will contact you to arrange for EMGs/NCV testing of the left arm to determine if the nerve is pinched at either the neck or the left elbow or both. Zotrix cream applied BID to the areas of numbness and tingling. Keep your appt for cervical MRI.

## 2017-06-20 ENCOUNTER — Ambulatory Visit: Payer: Medicaid Other

## 2017-06-20 ENCOUNTER — Encounter: Payer: Self-pay | Admitting: Podiatry

## 2017-06-20 ENCOUNTER — Ambulatory Visit (INDEPENDENT_AMBULATORY_CARE_PROVIDER_SITE_OTHER): Payer: Medicaid Other | Admitting: Podiatry

## 2017-06-20 DIAGNOSIS — L03119 Cellulitis of unspecified part of limb: Secondary | ICD-10-CM

## 2017-06-20 DIAGNOSIS — M79671 Pain in right foot: Secondary | ICD-10-CM

## 2017-06-20 DIAGNOSIS — M79672 Pain in left foot: Principal | ICD-10-CM

## 2017-06-20 DIAGNOSIS — L02619 Cutaneous abscess of unspecified foot: Secondary | ICD-10-CM | POA: Diagnosis not present

## 2017-06-23 NOTE — Progress Notes (Signed)
Subjective:    Patient ID: Baruch Goldmann, male   DOB: 56 y.o.   MRN: [id number]   HPI patient presents with problems between his lesser digits right foot with keratotic tissue formation thick nail disease that at times bother him    ROS      Objective:  Physical Exam neurovascular status unchanged with discoloration between the fourth and fifth toes right with thickened type tissue and patient's noted to have moderate structural deformity of the left foot     Assessment:    Digital deformity with hammertoe deformity as complicating factor     Plan:   H&P and conditions reviewed and discussed ultimate correction of deformity. Today I went ahead and debrided tissue flushed the area and advised on Lamisil usage and reappoint to recheck

## 2017-07-10 ENCOUNTER — Encounter (INDEPENDENT_AMBULATORY_CARE_PROVIDER_SITE_OTHER): Payer: Self-pay | Admitting: Physical Medicine and Rehabilitation

## 2017-07-10 ENCOUNTER — Ambulatory Visit (INDEPENDENT_AMBULATORY_CARE_PROVIDER_SITE_OTHER): Payer: Medicaid Other | Admitting: Physical Medicine and Rehabilitation

## 2017-07-10 DIAGNOSIS — R202 Paresthesia of skin: Secondary | ICD-10-CM | POA: Diagnosis not present

## 2017-07-10 NOTE — Progress Notes (Deleted)
Left arm numbness. Fifth finger is the worst. Right hand dominant. The symptoms are constant and do not change with position or activity. Has noticed the numbness for about 2 months. Also has weakness in the left hand.

## 2017-07-14 NOTE — Procedures (Signed)
EMG & NCV Findings: Evaluation of the left ulnar motor nerve showed decreased conduction velocity (B Elbow-Wrist, 35 m/s) and decreased conduction velocity (A Elbow-B Elbow, 22 m/s).  The left ulnar sensory nerve showed reduced amplitude (3.1 V).  All remaining nerves (as indicated in the following tables) were within normal limits.    Needle evaluation of the left first dorsal interosseous muscle showed increased insertional activity, widespread spontaneous activity, increased motor unit amplitude, and diminished recruitment.  The left flexor digitorum profundus muscle showed increased spontaneous activity, increased motor unit amplitude, and diminished recruitment.  All remaining muscles (as indicated in the following table) showed no evidence of electrical instability.    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   There is no electrodiagnostic evidence of any other focal nerve entrapment, brachial plexopathy or cervical radiculopathy.  As you know, this particular electrodiagnostic study cannot rule out chemical radiculitis or sensory only radiculopathy.  Recommendations: 1.  Follow-up with referring physician. 2.  Continue current management of symptoms. 3.  Suggest surgical evaluation.    Nerve Conduction Studies Anti Sensory Summary Table   Stim Site NR Peak (ms) Norm Peak (ms) P-T Amp (V) Norm P-T Amp Site1 Site2 Delta-P (ms) Dist (cm) Vel (m/s) Norm Vel (m/s)  Left Median Acr Palm Anti Sensory (2nd Digit)  33.5C  Wrist    3.5 <3.6 19.9 >10 Wrist Palm 1.5 0.0    Palm    2.0 <2.0 8.2         Left Radial Anti Sensory (Base 1st Digit)  34.4C  Wrist    1.8 <3.1 20.1  Wrist Base 1st Digit 1.8 0.0        1.9  18.9         Left Ulnar  Anti Sensory (5th Digit)  34C  Wrist    3.2 <3.7 *3.1 >15.0 Wrist 5th Digit 3.2 14.0 44 >38  B Elbow    7.7  11.6  B Elbow Wrist 4.5 0.0  >47   Motor Summary Table   Stim Site NR Onset (ms) Norm Onset (ms) O-P Amp (mV) Norm O-P Amp Site1 Site2 Delta-0 (ms) Dist (cm) Vel (m/s) Norm Vel (m/s)  Left Median Motor (Abd Poll Brev)  34.2C  Wrist    3.2 <4.2 10.6 >5 Elbow Wrist 4.7 25.5 54 >50  Elbow    7.9  10.3         Left Ulnar Motor (Abd Dig Min)  33.2C  Wrist    3.4 <4.2 4.7 >3 B Elbow Wrist 6.8 24.0 *35 >53  B Elbow    10.2  2.9  A Elbow B Elbow 5.5 12.0 *22 >53  A Elbow    15.7  2.8          EMG   Side Muscle Nerve Root Ins Act Fibs Psw Amp Dur Poly Recrt Int Fraser Din Comment  Left Abd Poll Brev Median C8-T1 Nml Nml Nml Nml Nml 0 Nml Nml   Left 1stDorInt Ulnar C8-T1 *Incr *4+ *4+ *Incr Nml 0 *Reduced Nml   Left PronatorTeres Median C6-7 Nml Nml Nml Nml Nml 0 Nml Nml   Left Biceps Musculocut C5-6 Nml Nml Nml Nml Nml 0 Nml Nml   Left Deltoid Axillary C5-6 Nml Nml Nml Nml Nml 0 Nml Nml   Left  FlexDigProf Ulnar C8,T1 Nml *3+ *3+ *Incr Nml 0 *Reduced Nml     Nerve Conduction Studies Anti Sensory Left/Right Comparison   Stim Site L Lat (ms) R Lat (ms) L-R Lat (ms) L Amp (V) R Amp (V) L-R Amp (%) Site1 Site2 L Vel (m/s) R Vel (m/s) L-R Vel (m/s)  Median Acr Palm Anti Sensory (2nd Digit)  33.5C  Wrist 3.5   19.9   Wrist Palm     Palm 2.0   8.2         Radial Anti Sensory (Base 1st Digit)  34.4C  Wrist 1.8   20.1   Wrist Base 1st Digit      1.9   18.9         Ulnar Anti Sensory (5th Digit)  34C  Wrist 3.2   *3.1   Wrist 5th Digit 44    B Elbow 7.7   11.6   B Elbow Wrist      Motor Left/Right Comparison   Stim Site L Lat (ms) R Lat (ms) L-R Lat (ms) L Amp (mV) R Amp (mV) L-R Amp (%) Site1 Site2 L Vel (m/s) R Vel (m/s) L-R Vel (m/s)  Median Motor (Abd Poll Brev)  34.2C  Wrist 3.2   10.6   Elbow Wrist 54    Elbow 7.9   10.3         Ulnar Motor (Abd Dig Min)  33.2C  Wrist 3.4    4.7   B Elbow Wrist *35    B Elbow 10.2   2.9   A Elbow B Elbow *22    A Elbow 15.7   2.8            Waveforms:

## 2017-07-14 NOTE — Progress Notes (Signed)
Gene Anderson - 56 y.o. male MRN [id number]  Date of birth: 22-Feb-1961  Office Visit Note: Visit Date: 07/10/2017 PCP: Darlyne Russian, MD Referred by: Darlyne Russian, MD  Subjective: Chief Complaint  Patient presents with  . Left Arm - Numbness   HPI: Mr. Gene Anderson is a 56 year old right-hand dominant gentleman who reports a 2 month history of chronic worsening left arm numbness that refers down to the fifth digits. He reports no specific injury. He does report some neck pain. He reports that the symptoms are fairly constant and do not change with any position or activity. He is divided right-sided symptoms. He's had no prior electrodiagnostic studies. He is having weakness of the left hand as well. I have seen him in the office before for his lumbar spine.    ROS Otherwise per HPI.  Assessment & Plan: Visit Diagnoses:  1. Paresthesia of skin     Plan: No additional findings.  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   There is no electrodiagnostic evidence of any other focal nerve entrapment, brachial plexopathy or cervical radiculopathy.  As you know, this particular electrodiagnostic study cannot rule out chemical radiculitis or sensory only radiculopathy.  Recommendations: 1.  Follow-up with referring physician. 2.  Continue current management of symptoms. 3.  Suggest surgical evaluation.   Meds & Orders: No orders of the defined types were placed in this encounter.   Orders Placed This Encounter  Procedures  . NCV with EMG (electromyography)    Follow-up: Return for Dr. Louanne Skye.   Procedures: No procedures performed  EMG & NCV Findings: Evaluation of the left ulnar motor nerve showed decreased conduction velocity (B  Elbow-Wrist, 35 m/s) and decreased conduction velocity (A Elbow-B Elbow, 22 m/s).  The left ulnar sensory nerve showed reduced amplitude (3.1 V).  All remaining nerves (as indicated in the following tables) were within normal limits.    Needle evaluation of the left first dorsal interosseous muscle showed increased insertional activity, widespread spontaneous activity, increased motor unit amplitude, and diminished recruitment.  The left flexor digitorum profundus muscle showed increased spontaneous activity, increased motor unit amplitude, and diminished recruitment.  All remaining muscles (as indicated in the following table) showed no evidence of electrical instability.    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   There is no electrodiagnostic evidence of any other focal nerve entrapment, brachial plexopathy or cervical radiculopathy.  As you know, this particular electrodiagnostic study cannot rule out chemical radiculitis or sensory only radiculopathy.  Recommendations: 1.  Follow-up with referring physician. 2.  Continue current management of symptoms. 3.  Suggest surgical evaluation.    Nerve Conduction Studies Anti Sensory Summary Table   Stim Site NR Peak (ms) Norm Peak (ms) P-T Amp (V) Norm P-T Amp Site1 Site2 Delta-P (ms) Dist (cm) Vel (m/s) Norm Vel (m/s)  Left Median Acr Palm Anti Sensory (2nd Digit)  33.5C  Wrist    3.5 <3.6 19.9 >10 Wrist Palm 1.5 0.0    Palm    2.0 <2.0 8.2  Left Radial Anti Sensory (Base 1st Digit)  34.4C  Wrist    1.8 <3.1 20.1  Wrist Base 1st Digit 1.8 0.0        1.9  18.9         Left Ulnar Anti Sensory (5th Digit)  34C  Wrist    3.2 <3.7 *3.1 >15.0 Wrist 5th Digit 3.2 14.0 44 >38  B Elbow     7.7  11.6  B Elbow Wrist 4.5 0.0  >47   Motor Summary Table   Stim Site NR Onset (ms) Norm Onset (ms) O-P Amp (mV) Norm O-P Amp Site1 Site2 Delta-0 (ms) Dist (cm) Vel (m/s) Norm Vel (m/s)  Left Median Motor (Abd Poll Brev)  34.2C  Wrist    3.2 <4.2 10.6 >5 Elbow Wrist 4.7 25.5 54 >50  Elbow    7.9  10.3         Left Ulnar Motor (Abd Dig Min)  33.2C  Wrist    3.4 <4.2 4.7 >3 B Elbow Wrist 6.8 24.0 *35 >53  B Elbow    10.2  2.9  A Elbow B Elbow 5.5 12.0 *22 >53  A Elbow    15.7  2.8          EMG   Side Muscle Nerve Root Ins Act Fibs Psw Amp Dur Poly Recrt Int Fraser Din Comment  Left Abd Poll Brev Median C8-T1 Nml Nml Nml Nml Nml 0 Nml Nml   Left 1stDorInt Ulnar C8-T1 *Incr *4+ *4+ *Incr Nml 0 *Reduced Nml   Left PronatorTeres Median C6-7 Nml Nml Nml Nml Nml 0 Nml Nml   Left Biceps Musculocut C5-6 Nml Nml Nml Nml Nml 0 Nml Nml   Left Deltoid Axillary C5-6 Nml Nml Nml Nml Nml 0 Nml Nml   Left FlexDigProf Ulnar C8,T1 Nml *3+ *3+ *Incr Nml 0 *Reduced Nml     Nerve Conduction Studies Anti Sensory Left/Right Comparison   Stim Site L Lat (ms) R Lat (ms) L-R Lat (ms) L Amp (V) R Amp (V) L-R Amp (%) Site1 Site2 L Vel (m/s) R Vel (m/s) L-R Vel (m/s)  Median Acr Palm Anti Sensory (2nd Digit)  33.5C  Wrist 3.5   19.9   Wrist Palm     Palm 2.0   8.2         Radial Anti Sensory (Base 1st Digit)  34.4C  Wrist 1.8   20.1   Wrist Base 1st Digit      1.9   18.9         Ulnar Anti Sensory (5th Digit)  34C  Wrist 3.2   *3.1   Wrist 5th Digit 44    B Elbow 7.7   11.6   B Elbow Wrist      Motor Left/Right Comparison   Stim Site L Lat (ms) R Lat (ms) L-R Lat (ms) L Amp (mV) R Amp (mV) L-R Amp (%) Site1 Site2 L Vel (m/s) R Vel (m/s) L-R Vel (m/s)  Median Motor (Abd Poll Brev)  34.2C  Wrist 3.2   10.6   Elbow Wrist 54    Elbow 7.9   10.3         Ulnar Motor (Abd Dig Min)  33.2C  Wrist 3.4   4.7   B Elbow Wrist *35    B Elbow 10.2   2.9   A Elbow B Elbow *22    A Elbow 15.7   2.8  Waveforms:            Clinical History: No specialty comments available.  He reports that he has been smoking Cigarettes.  He has a 36.00 pack-year smoking history. He has never used smokeless tobacco. No results for input(s): HGBA1C, LABURIC in the last 8760 hours.  Objective:  VS:  HT:    WT:   BMI:     BP:   HR: bpm  TEMP: ( )  RESP:  Physical Exam  Musculoskeletal:  Inspection reveals mild atrophy of the left FDI no atrophy of the bilateral APB or hand intrinsics. There is a mild Benedictine sign but no clawing on the left. There is no swelling, color changes, allodynia or dystrophic changes. There is 5 out of 5 strength in the bilateral wrist extension and long finger flexion he does have weakness with left finger abduction. There is decreased sensation in the ulnar nerve distribution on the left. There is a positive Froment's test on the left. There is a negative Hoffmann's test bilaterally.    Ortho Exam Imaging: No results found.  Past Medical/Family/Surgical/Social History: Medications & Allergies reviewed per EMR Patient Active Problem List   Diagnosis Date Noted  . Atrial fibrillation ([place]) 04/18/2014  . Paresthesia 02/16/2014  . Neck pain 02/16/2014  . Leukopenia 02/16/2013   Past Medical History:  Diagnosis Date  . Atrial fibrillation ([place])   . Diverticulosis   . Fatty liver 06/20/13  . Hyperplastic colon polyp   . Internal hemorrhoids   . Leukopenia   . Neck pain   . Prostate enlargement   . Spinal stenosis    Family History  Problem Relation Age of Onset  . Cancer Father   . Stroke Maternal Grandfather   . Cancer Paternal Grandmother    Past Surgical History:  Procedure Laterality Date  . CARDIOVERSION  03/2014  . Flexible cystoscopy and repair of torn tunica albuginea     Social History   Occupational History  .  [employer]   Social History Main Topics  . Smoking status: Current Every Day Smoker    Packs/day: 1.00     Years: 36.00    Types: Cigarettes  . Smokeless tobacco: Never Used  . Alcohol use Yes     Comment: social  . Drug use: No  . Sexual activity: Not on file

## 2017-07-17 ENCOUNTER — Ambulatory Visit (INDEPENDENT_AMBULATORY_CARE_PROVIDER_SITE_OTHER): Payer: Self-pay | Admitting: Specialist

## 2017-07-28 ENCOUNTER — Ambulatory Visit (INDEPENDENT_AMBULATORY_CARE_PROVIDER_SITE_OTHER): Payer: Medicaid Other | Admitting: Specialist

## 2017-07-31 ENCOUNTER — Encounter (INDEPENDENT_AMBULATORY_CARE_PROVIDER_SITE_OTHER): Payer: Self-pay | Admitting: Specialist

## 2017-07-31 ENCOUNTER — Telehealth (INDEPENDENT_AMBULATORY_CARE_PROVIDER_SITE_OTHER): Payer: Self-pay | Admitting: Specialist

## 2017-07-31 ENCOUNTER — Ambulatory Visit (INDEPENDENT_AMBULATORY_CARE_PROVIDER_SITE_OTHER): Payer: Medicaid Other | Admitting: Specialist

## 2017-07-31 VITALS — BP 140/91 | HR 71 | Ht 73.0 in | Wt 210.0 lb

## 2017-07-31 DIAGNOSIS — G5622 Lesion of ulnar nerve, left upper limb: Secondary | ICD-10-CM | POA: Diagnosis not present

## 2017-07-31 NOTE — Patient Instructions (Addendum)
No eating of drinking post Midnight. Surgery for the left elbow, a left ulnar neurolysis ( release of scar and pressure on the left ulnar nerve at the elbow ( the funny bone) Risks of surgery include infection 1 in 300 (less than 1/2%) Risk of bleeding, minimal less than .001% Risk of nerve injury less than 1:100,000. Will need to wear a posterior arm splint for 3 weeks, remove or examine incision at 2 weeks post op. Will use a sling for 2-3 weeks. No lifting left arm for 3 weeks. Kandice Hams our surgery scheduling secretary will call to schedule you for surgery.

## 2017-07-31 NOTE — Progress Notes (Addendum)
Office Visit Note   Patient: Gene Anderson           Date of Birth: Jun 02, 1961           MRN: [id number] Visit Date: 07/31/2017              Requested by: Darlyne Russian, MD [address] [place], [place] [id number] PCP: Darlyne Russian, MD   Assessment & Plan: Visit Diagnoses:  1. Cubital tunnel syndrome on left     Plan: No eating of drinking post Midnight. Surgery for the left elbow, a left ulnar neurolysis ( release of scar and pressure on the left ulnar nerve at the elbow ( the funny bone) Risks of surgery include infection 1 in 300 (less than 1/2%) Risk of bleeding, minimal less than .001% Risk of nerve injury less than 1:100,000. Will need to wear a posterior arm splint for 3 weeks, remove or examine incision at 2 weeks post op. Will use a sling for 2-3 weeks. No lifting left arm for 3 weeks. Kandice Hams our surgery scheduling secretary will call to schedule you for surgery   Follow-Up Instructions: Return in about 4 weeks (around 08/28/2017) for post op visit..   Orders:  No orders of the defined types were placed in this encounter.  No orders of the defined types were placed in this encounter.     Procedures: No procedures performed   Clinical Data: No additional findings.   Subjective: Chief Complaint  Patient presents with  . Neck - Follow-up    56 year old male right handed with left arm and hand numbness and paresthesias that is constant. Weaking of the left hand. He plays bass guitar  And is having difficulty moving the fingers in the left hand for pressing on the strings.     Review of Systems  Constitutional: Negative.   HENT: Negative.   Eyes: Negative.   Respiratory: Negative.   Cardiovascular: Negative.   Gastrointestinal: Negative.   Endocrine: Negative.   Genitourinary: Negative.   Musculoskeletal: Negative.   Skin: Negative.   Allergic/Immunologic: Negative.   Neurological: Negative.   Hematological: Negative.     Psychiatric/Behavioral: Negative.      Objective: Vital Signs: BP (!) 140/91 (BP Location: Left Arm, Patient Position: Sitting)   Pulse 71   Ht 6\' 1"  (1.854 m)   Wt 210 lb (95.3 kg)   BMI 27.71 kg/m   Physical Exam  Constitutional: He is oriented to person, place, and time. He appears well-developed and well-nourished.  HENT:  Head: Normocephalic and atraumatic.  Eyes: Pupils are equal, round, and reactive to light. EOM are normal.  Neck: Normal range of motion. Neck supple.  Pulmonary/Chest: Effort normal and breath sounds normal.  Abdominal: Soft. Bowel sounds are normal.  Musculoskeletal: Normal range of motion.  Neurological: He is alert and oriented to person, place, and time.  Skin: Skin is warm and dry.  Psychiatric: He has a normal mood and affect. His behavior is normal. Judgment and thought content normal.    Left Elbow Exam  Left elbow exam is normal.  Muscle Strength  Pronation:  5/5  Supination:  5/5   Tests Varus: negative Valgus: negative Tinel's Sign (cubital tunnel): negative  Other  Scars: present Sensation: decreased Pulse: present  Comments:  Tinels left elbow. Hyperflexion of the eblow.      Specialty Comments:  No specialty comments available.  Imaging: No results found.   PMFS History: Patient  Active Problem List   Diagnosis Date Noted  . Atrial fibrillation ([place]) 04/18/2014  . Paresthesia 02/16/2014  . Neck pain 02/16/2014  . Leukopenia 02/16/2013   Past Medical History:  Diagnosis Date  . Atrial fibrillation ([place])   . Diverticulosis   . Fatty liver 06/20/13  . Hyperplastic colon polyp   . Internal hemorrhoids   . Leukopenia   . Neck pain   . Prostate enlargement   . Spinal stenosis     Family History  Problem Relation Age of Onset  . Cancer Father   . Stroke Maternal Grandfather   . Cancer Paternal Grandmother     Past Surgical History:  Procedure Laterality Date  . CARDIOVERSION  03/2014  . Flexible  cystoscopy and repair of torn tunica albuginea     Social History   Occupational History  .  [employer]   Social History Main Topics  . Smoking status: Current Every Day Smoker    Packs/day: 1.00    Years: 36.00    Types: Cigarettes  . Smokeless tobacco: Never Used  . Alcohol use Yes     Comment: social  . Drug use: No  . Sexual activity: Not on file

## 2017-07-31 NOTE — Telephone Encounter (Signed)
Please sched pt if he needs an earlier appt fot his post op.sched him for 09/11/17 @1 :45pm.

## 2017-08-01 NOTE — Telephone Encounter (Signed)
Put on cancellation list 

## 2017-08-18 ENCOUNTER — Encounter (HOSPITAL_BASED_OUTPATIENT_CLINIC_OR_DEPARTMENT_OTHER): Payer: Self-pay | Admitting: *Deleted

## 2017-08-25 ENCOUNTER — Encounter (HOSPITAL_BASED_OUTPATIENT_CLINIC_OR_DEPARTMENT_OTHER)
Admission: RE | Admit: 2017-08-25 | Discharge: 2017-08-25 | Disposition: A | Discharge status: Home / Self Care | Payer: Medicaid Other | Source: Ambulatory Visit | Attending: Specialist | Admitting: Specialist

## 2017-08-25 ENCOUNTER — Other Ambulatory Visit (INDEPENDENT_AMBULATORY_CARE_PROVIDER_SITE_OTHER): Payer: Self-pay | Admitting: Specialist

## 2017-08-25 DIAGNOSIS — F17210 Nicotine dependence, cigarettes, uncomplicated: Secondary | ICD-10-CM | POA: Diagnosis not present

## 2017-08-25 DIAGNOSIS — Z9889 Other specified postprocedural states: Secondary | ICD-10-CM | POA: Diagnosis not present

## 2017-08-25 DIAGNOSIS — Z79899 Other long term (current) drug therapy: Secondary | ICD-10-CM | POA: Diagnosis not present

## 2017-08-25 DIAGNOSIS — F17200 Nicotine dependence, unspecified, uncomplicated: Secondary | ICD-10-CM | POA: Insufficient documentation

## 2017-08-25 DIAGNOSIS — G5622 Lesion of ulnar nerve, left upper limb: Secondary | ICD-10-CM

## 2017-08-25 DIAGNOSIS — I4891 Unspecified atrial fibrillation: Secondary | ICD-10-CM | POA: Insufficient documentation

## 2017-08-25 DIAGNOSIS — Z01810 Encounter for preprocedural cardiovascular examination: Secondary | ICD-10-CM | POA: Diagnosis not present

## 2017-08-26 ENCOUNTER — Encounter (HOSPITAL_BASED_OUTPATIENT_CLINIC_OR_DEPARTMENT_OTHER)
Admission: RE | Disposition: A | Discharge status: Home / Self Care | Payer: Self-pay | Source: Ambulatory Visit | Attending: Specialist

## 2017-08-26 ENCOUNTER — Ambulatory Visit (HOSPITAL_BASED_OUTPATIENT_CLINIC_OR_DEPARTMENT_OTHER): Payer: Medicaid Other | Admitting: Anesthesiology

## 2017-08-26 ENCOUNTER — Ambulatory Visit (HOSPITAL_BASED_OUTPATIENT_CLINIC_OR_DEPARTMENT_OTHER)
Admission: RE | Admit: 2017-08-26 | Discharge: 2017-08-26 | Disposition: A | Discharge status: Home / Self Care | Payer: Medicaid Other | Source: Ambulatory Visit | Attending: Specialist | Admitting: Specialist

## 2017-08-26 ENCOUNTER — Encounter (HOSPITAL_BASED_OUTPATIENT_CLINIC_OR_DEPARTMENT_OTHER): Payer: Self-pay | Admitting: Emergency Medicine

## 2017-08-26 ENCOUNTER — Other Ambulatory Visit: Payer: Self-pay

## 2017-08-26 DIAGNOSIS — Z791 Long term (current) use of non-steroidal anti-inflammatories (NSAID): Secondary | ICD-10-CM | POA: Insufficient documentation

## 2017-08-26 DIAGNOSIS — G5622 Lesion of ulnar nerve, left upper limb: Secondary | ICD-10-CM | POA: Insufficient documentation

## 2017-08-26 DIAGNOSIS — Z79899 Other long term (current) drug therapy: Secondary | ICD-10-CM | POA: Insufficient documentation

## 2017-08-26 DIAGNOSIS — F17210 Nicotine dependence, cigarettes, uncomplicated: Secondary | ICD-10-CM | POA: Insufficient documentation

## 2017-08-26 DIAGNOSIS — Z79891 Long term (current) use of opiate analgesic: Secondary | ICD-10-CM | POA: Diagnosis not present

## 2017-08-26 DIAGNOSIS — I4891 Unspecified atrial fibrillation: Secondary | ICD-10-CM | POA: Insufficient documentation

## 2017-08-26 HISTORY — PX: ULNAR NERVE TRANSPOSITION: SHX2595

## 2017-08-26 HISTORY — DX: Lesion of ulnar nerve, left upper limb: G56.22

## 2017-08-26 SURGERY — ULNAR NERVE DECOMPRESSION/TRANSPOSITION
Anesthesia: Regional | Site: Elbow | Laterality: Left

## 2017-08-26 MED ORDER — FENTANYL CITRATE (PF) 100 MCG/2ML IJ SOLN
INTRAMUSCULAR | Status: AC
  Filled 2017-08-26: qty 2

## 2017-08-26 MED ORDER — BUPIVACAINE HCL (PF) 0.5 % IJ SOLN
INTRAMUSCULAR | Status: AC
Start: 2017-08-26 — End: ?
  Filled 2017-08-26: qty 30

## 2017-08-26 MED ORDER — MIDAZOLAM HCL 2 MG/2ML IJ SOLN
INTRAMUSCULAR | Status: AC
  Filled 2017-08-26: qty 2

## 2017-08-26 MED ORDER — EPHEDRINE 5 MG/ML INJ
INTRAVENOUS | Status: AC
  Filled 2017-08-26: qty 10

## 2017-08-26 MED ORDER — BUPIVACAINE LIPOSOME 1.3 % IJ SUSP
INTRAMUSCULAR | Status: DC | PRN
  Administered 2017-08-26: 10 mL

## 2017-08-26 MED ORDER — CEFAZOLIN SODIUM-DEXTROSE 2-4 GM/100ML-% IV SOLN
INTRAVENOUS | Status: AC
  Filled 2017-08-26: qty 100

## 2017-08-26 MED ORDER — PROPOFOL 10 MG/ML IV BOLUS
INTRAVENOUS | Status: AC
  Filled 2017-08-26: qty 20

## 2017-08-26 MED ORDER — OXYCODONE-ACETAMINOPHEN 5-325 MG PO TABS: 1.0000 | ORAL_TABLET | ORAL | 0 refills | Status: DC | PRN

## 2017-08-26 MED ORDER — MIDAZOLAM HCL 2 MG/2ML IJ SOLN: 1.0000 mg | INTRAMUSCULAR | Status: DC | PRN

## 2017-08-26 MED ORDER — PROPOFOL 10 MG/ML IV BOLUS
INTRAVENOUS | Status: DC | PRN
  Administered 2017-08-26: 300 mg via INTRAVENOUS

## 2017-08-26 MED ORDER — ONDANSETRON HCL 4 MG/2ML IJ SOLN: 4.0000 mg | Freq: Once | INTRAMUSCULAR | Status: DC | PRN

## 2017-08-26 MED ORDER — ONDANSETRON HCL 4 MG/2ML IJ SOLN
INTRAMUSCULAR | Status: DC | PRN
  Administered 2017-08-26: 4 mg via INTRAVENOUS

## 2017-08-26 MED ORDER — CEFAZOLIN SODIUM-DEXTROSE 2-4 GM/100ML-% IV SOLN
2.0000 g | INTRAVENOUS | Status: AC
  Administered 2017-08-26: 2 g via INTRAVENOUS

## 2017-08-26 MED ORDER — DEXAMETHASONE SODIUM PHOSPHATE 4 MG/ML IJ SOLN
INTRAMUSCULAR | Status: DC | PRN
  Administered 2017-08-26: 10 mg via INTRAVENOUS

## 2017-08-26 MED ORDER — FENTANYL CITRATE (PF) 100 MCG/2ML IJ SOLN
INTRAMUSCULAR | Status: DC | PRN
  Administered 2017-08-26: 50 ug via INTRAVENOUS
  Administered 2017-08-26: 100 ug via INTRAVENOUS
  Administered 2017-08-26: 50 ug via INTRAVENOUS

## 2017-08-26 MED ORDER — BUPIVACAINE LIPOSOME 1.3 % IJ SUSP
INTRAMUSCULAR | Status: AC
  Filled 2017-08-26: qty 20

## 2017-08-26 MED ORDER — EPHEDRINE SULFATE 50 MG/ML IJ SOLN
INTRAMUSCULAR | Status: DC | PRN
  Administered 2017-08-26 (×3): 10 mg via INTRAVENOUS

## 2017-08-26 MED ORDER — MIDAZOLAM HCL 5 MG/5ML IJ SOLN
INTRAMUSCULAR | Status: DC | PRN
  Administered 2017-08-26: 2 mg via INTRAVENOUS

## 2017-08-26 MED ORDER — SCOPOLAMINE 1 MG/3DAYS TD PT72: 1.0000 | MEDICATED_PATCH | Freq: Once | TRANSDERMAL | Status: DC | PRN

## 2017-08-26 MED ORDER — LIDOCAINE 2% (20 MG/ML) 5 ML SYRINGE
INTRAMUSCULAR | Status: DC | PRN
  Administered 2017-08-26: 100 mg via INTRAVENOUS

## 2017-08-26 MED ORDER — PHENYLEPHRINE HCL 10 MG/ML IJ SOLN
INTRAMUSCULAR | Status: DC | PRN
  Administered 2017-08-26 (×2): 40 ug via INTRAVENOUS

## 2017-08-26 MED ORDER — FENTANYL CITRATE (PF) 100 MCG/2ML IJ SOLN: 50.0000 ug | INTRAMUSCULAR | Status: DC | PRN

## 2017-08-26 MED ORDER — FENTANYL CITRATE (PF) 100 MCG/2ML IJ SOLN
25.0000 ug | INTRAMUSCULAR | Status: DC | PRN
  Administered 2017-08-26 (×2): 50 ug via INTRAVENOUS

## 2017-08-26 MED ORDER — LACTATED RINGERS IV SOLN
INTRAVENOUS | Status: DC
  Administered 2017-08-26 (×2): via INTRAVENOUS

## 2017-08-26 MED ORDER — ONDANSETRON HCL 4 MG/2ML IJ SOLN
INTRAMUSCULAR | Status: AC
  Filled 2017-08-26: qty 2

## 2017-08-26 SURGICAL SUPPLY — 65 items
APL SKNCLS STERI-STRIP NONHPOA (GAUZE/BANDAGES/DRESSINGS) ×1
BANDAGE ACE 3X5.8 VEL STRL LF (GAUZE/BANDAGES/DRESSINGS) IMPLANT
BANDAGE ACE 4X5 VEL STRL LF (GAUZE/BANDAGES/DRESSINGS) ×6 IMPLANT
BENZOIN TINCTURE PRP APPL 2/3 (GAUZE/BANDAGES/DRESSINGS) ×3 IMPLANT
BLADE SURG 15 STRL LF DISP TIS (BLADE) ×1 IMPLANT
BLADE SURG 15 STRL SS (BLADE) ×3
BNDG CMPR 9X4 STRL LF SNTH (GAUZE/BANDAGES/DRESSINGS) ×1
BNDG ESMARK 4X9 LF (GAUZE/BANDAGES/DRESSINGS) ×2 IMPLANT
BNDG GAUZE ELAST 4 BULKY (GAUZE/BANDAGES/DRESSINGS) ×2 IMPLANT
CLOSURE WOUND 1/2 X4 (GAUZE/BANDAGES/DRESSINGS) ×1
CORD BIPOLAR FORCEPS 12FT (ELECTRODE) ×3 IMPLANT
COVER BACK TABLE 60X90IN (DRAPES) ×3 IMPLANT
CUFF TOURN SGL LL 18 NRW (TOURNIQUET CUFF) ×3 IMPLANT
DECANTER SPIKE VIAL GLASS SM (MISCELLANEOUS) IMPLANT
DRAPE EXTREMITY T 121X128X90 (DRAPE) ×3 IMPLANT
DRAPE SURG 17X23 STRL (DRAPES) ×3 IMPLANT
DRSG EMULSION OIL 3X3 NADH (GAUZE/BANDAGES/DRESSINGS) ×3 IMPLANT
DRSG PAD ABDOMINAL 8X10 ST (GAUZE/BANDAGES/DRESSINGS) ×7 IMPLANT
DURAPREP 26ML APPLICATOR (WOUND CARE) ×3 IMPLANT
GAUZE SPONGE 4X4 12PLY STRL LF (GAUZE/BANDAGES/DRESSINGS) ×2 IMPLANT
GAUZE SPONGE 4X4 16PLY XRAY LF (GAUZE/BANDAGES/DRESSINGS) IMPLANT
GLOVE BIO SURGEON STRL SZ 6.5 (GLOVE) ×2 IMPLANT
GLOVE BIO SURGEON STRL SZ8.5 (GLOVE) ×3 IMPLANT
GLOVE BIO SURGEONS STRL SZ 6.5 (GLOVE) ×2
GLOVE BIOGEL PI IND STRL 8 (GLOVE) IMPLANT
GLOVE BIOGEL PI IND STRL 9 (GLOVE) ×1 IMPLANT
GLOVE BIOGEL PI INDICATOR 8 (GLOVE) ×2
GLOVE BIOGEL PI INDICATOR 9 (GLOVE) ×2
GLOVE ORTHO TXT STRL SZ7.5 (GLOVE) ×2 IMPLANT
GOWN STRL REUS W/ TWL LRG LVL3 (GOWN DISPOSABLE) ×1 IMPLANT
GOWN STRL REUS W/ TWL XL LVL3 (GOWN DISPOSABLE) ×1 IMPLANT
GOWN STRL REUS W/TWL LRG LVL3 (GOWN DISPOSABLE) ×6
GOWN STRL REUS W/TWL XL LVL3 (GOWN DISPOSABLE) ×3
LOOP VESSEL MAXI BLUE (MISCELLANEOUS) IMPLANT
NDL HYPO 25X1 1.5 SAFETY (NEEDLE) IMPLANT
NEEDLE HYPO 25X1 1.5 SAFETY (NEEDLE) ×3 IMPLANT
NS IRRIG 1000ML POUR BTL (IV SOLUTION) ×3 IMPLANT
PACK BASIN DAY SURGERY FS (CUSTOM PROCEDURE TRAY) ×3 IMPLANT
PAD CAST 3X4 CTTN HI CHSV (CAST SUPPLIES) IMPLANT
PAD CAST 4YDX4 CTTN HI CHSV (CAST SUPPLIES) ×1 IMPLANT
PADDING CAST ABS 4INX4YD NS (CAST SUPPLIES)
PADDING CAST ABS COTTON 4X4 ST (CAST SUPPLIES) IMPLANT
PADDING CAST COTTON 3X4 STRL (CAST SUPPLIES)
PADDING CAST COTTON 4X4 STRL (CAST SUPPLIES) ×3
SHEET MEDIUM DRAPE 40X70 STRL (DRAPES) ×3 IMPLANT
SLING ARM FOAM STRAP LRG (SOFTGOODS) IMPLANT
SLING ARM FOAM STRAP XLG (SOFTGOODS) ×2 IMPLANT
SLING ARM MED ADULT FOAM STRAP (SOFTGOODS) IMPLANT
SPLINT FIBERGLASS 3X35 (CAST SUPPLIES) ×3 IMPLANT
STOCKINETTE 4X48 STRL (DRAPES) ×3 IMPLANT
STRIP CLOSURE SKIN 1/2X4 (GAUZE/BANDAGES/DRESSINGS) ×2 IMPLANT
SUT ETHIBOND 2 OS 4 DA (SUTURE) IMPLANT
SUT ETHIBOND 3-0 V-5 (SUTURE) IMPLANT
SUT ETHILON 4 0 PS 2 18 (SUTURE) IMPLANT
SUT PROLENE 3 0 PS 2 (SUTURE) IMPLANT
SUT VIC AB 0 CT1 27 (SUTURE)
SUT VIC AB 0 CT1 27XBRD ANBCTR (SUTURE) IMPLANT
SUT VIC AB 2-0 SH 27 (SUTURE) ×3
SUT VIC AB 2-0 SH 27XBRD (SUTURE) IMPLANT
SUT VIC AB 3-0 X1 27 (SUTURE) ×2 IMPLANT
SUT VICRYL 4-0 PS2 18IN ABS (SUTURE) IMPLANT
SYR 20CC LL (SYRINGE) ×2 IMPLANT
SYR BULB 3OZ (MISCELLANEOUS) ×3 IMPLANT
TOWEL OR 17X24 6PK STRL BLUE (TOWEL DISPOSABLE) ×3 IMPLANT
UNDERPAD 30X30 (UNDERPADS AND DIAPERS) ×3 IMPLANT

## 2017-08-26 NOTE — Transfer of Care (Signed)
Immediate Anesthesia Transfer of Care Note  Patient: Gene Anderson  Procedure(s) Performed: LEFT ELBOW ULNAR NEUROLYSIS (Left Elbow)  Patient Location: PACU  Anesthesia Type:General  Level of Consciousness: awake, alert  and oriented  Airway & Oxygen Therapy: Patient Spontanous Breathing and Patient connected to face mask oxygen  Post-op Assessment: Report given to RN and Post -op Vital signs reviewed and stable  Post vital signs: Reviewed and stable  Last Vitals:  Vitals:   08/26/17 1241  BP: (!) 128/95  Pulse: 79  Resp: (!) 23  Temp: 36.6 C  SpO2: 100%    Last Pain:  Vitals:   08/26/17 1241  TempSrc: Oral  PainSc: 0-No pain         Complications: No apparent anesthesia complications

## 2017-08-26 NOTE — Interval H&P Note (Signed)
History and Physical Interval Note:  08/26/2017 1:45 PM  Gene Anderson  has presented today for surgery, with the diagnosis of Severe left cubital tunnel syndrome  The various methods of treatment have been discussed with the patient and family. After consideration of risks, benefits and other options for treatment, the patient has consented to  Procedure(s): LEFT ELBOW ULNAR NEUROLYSIS WITH POSSIBLE MEDIAL EPICONDYLECTOMY (Left) as a surgical intervention .  The patient's history has been reviewed, patient examined, no change in status, stable for surgery.  I have reviewed the patient's chart and labs.  Questions were answered to the patient's satisfaction.     [name]

## 2017-08-26 NOTE — Anesthesia Preprocedure Evaluation (Addendum)
Anesthesia Evaluation  Patient identified by MRN, date of birth, ID band Patient awake    Reviewed: Allergy & Precautions, NPO status , Patient's Chart, lab work & pertinent test results  Airway Mallampati: II  TM Distance: >3 FB Neck ROM: Full    Dental  (+) Missing,    Pulmonary Current Smoker,    Pulmonary exam normal breath sounds clear to auscultation       Cardiovascular Normal cardiovascular exam+ dysrhythmias Atrial Fibrillation  Rhythm:Regular Rate:Normal  Sees cardiologist Lovena Le)   Neuro/Psych negative neurological ROS  negative psych ROS   GI/Hepatic negative GI ROS, Neg liver ROS,   Endo/Other  negative endocrine ROS  Renal/GU negative Renal ROS     Musculoskeletal Spinal stenosis   Abdominal   Peds  Hematology negative hematology ROS (+)   Anesthesia Other Findings   Reproductive/Obstetrics                            Anesthesia Physical Anesthesia Plan  ASA: III  Anesthesia Plan: General   Post-op Pain Management:    Induction: Intravenous  PONV Risk Score and Plan: 1 and Ondansetron, Midazolam and Dexamethasone  Airway Management Planned: LMA  Additional Equipment:   Intra-op Plan:   Post-operative Plan: Extubation in OR  Informed Consent: I have reviewed the patients History and Physical, chart, labs and discussed the procedure including the risks, benefits and alternatives for the proposed anesthesia with the patient or authorized representative who has indicated his/her understanding and acceptance.   Dental advisory given  Plan Discussed with: CRNA  Anesthesia Plan Comments: (Potential post op regional discussed)      Anesthesia Quick Evaluation

## 2017-08-26 NOTE — H&P (Signed)
Gene Anderson is an 56 y.o. male.   Chief Complaint: left arm pain, numbness HPI: patient with hx of ulnar neuropathy and aove complaint presents for surgical intervention.   Past Medical History:  Diagnosis Date  . Atrial fibrillation ([place])   . Diverticulosis   . Fatty liver 06/20/13  . Hyperplastic colon polyp   . Internal hemorrhoids   . Leukopenia   . Neck pain   . Prostate enlargement   . Spinal stenosis     Past Surgical History:  Procedure Laterality Date  . CARDIOVERSION  03/2014  . Flexible cystoscopy and repair of torn tunica albuginea      Family History  Problem Relation Age of Onset  . Cancer Father   . Stroke Maternal Grandfather   . Cancer Paternal Grandmother    Social History:  reports that he has been smoking cigarettes.  He has a 36.00 pack-year smoking history. he has never used smokeless tobacco. He reports that he drinks alcohol. He reports that he does not use drugs.  Allergies: No Known Allergies  Medications Prior to Admission  Medication Sig Dispense Refill  . diclofenac (VOLTAREN) 75 MG EC tablet Take 1 tablet (75 mg total) by mouth 2 (two) times daily. 180 tablet 4  . diclofenac sodium (VOLTAREN) 1 % GEL Apply 2 g topically 4 (four) times daily as needed (For pain.).    Marland Kitchen gabapentin (NEURONTIN) 300 MG capsule Take at night time for 3 days then at night and in the AM for 3 days  And then TID. 90 capsule 3  . naphazoline-pheniramine (NAPHCON-A) 0.025-0.3 % ophthalmic solution Place 1 drop into both eyes 2 (two) times daily as needed for irritation or allergies (USES IN THE MORNING).     Marland Kitchen Oxycodone HCl 20 MG TABS Take 20 mg by mouth 4 (four) times daily as needed. For pain.  0  . flecainide (TAMBOCOR) 100 MG tablet Take 1 tablet (100 mg total) by mouth daily as needed. for palpitations 30 tablet 5  . metoprolol (LOPRESSOR) 50 MG tablet Take 1 tablet (50 mg total) by mouth daily as needed. for palpitations 30 tablet 5    No results found for this  or any previous visit (from the past 48 hour(s)). No results found.  Review of Systems  Constitutional: Negative.   HENT: Negative.   Respiratory: Negative.   Cardiovascular: Negative.   Musculoskeletal: Positive for joint pain.  Skin: Negative.   Neurological: Positive for tingling.  Psychiatric/Behavioral: Negative.     Blood pressure (!) 128/95, pulse 79, temperature 97.9 F (36.6 C), temperature source Oral, resp. rate (!) 23, height 6\' 1"  (1.854 m), weight 212 lb 6.4 oz (96.3 kg), SpO2 100 %. Physical Exam  Constitutional: He is oriented to person, place, and time. He appears well-developed. No distress.  HENT:  Head: Normocephalic and atraumatic.  Neck: Normal range of motion.  Respiratory: No respiratory distress.  GI: He exhibits no distension.  Musculoskeletal: He exhibits tenderness.  Neurological: He is alert and oriented to person, place, and time.  Skin: Skin is warm and dry.  Psychiatric: He has a normal mood and affect.     Assessment/Plan Left elbow ulnar neuropathy  Will proceed with LEFT ELBOW ULNAR NEUROLYSIS WITH POSSIBLE MEDIAL EPICONDYLECTOMY as scheduled.  Surgical procedure along with possible risks and complications discussed.  All questions answered.   Benjiman Core, PA-C 08/26/2017, 1:33 PM

## 2017-08-26 NOTE — Op Note (Signed)
08/26/2017  3:06 PM  PATIENT:  Gene Anderson  56 y.o. male  MRN: [id number]  OPERATIVE REPORT  PRE-OPERATIVE DIAGNOSIS:  Severe left cubital tunnel syndrome  POST-OPERATIVE DIAGNOSIS:  Severe left cubital tunnel syndrome  PROCEDURE:  Procedure(s): LEFT ELBOW ULNAR NEUROLYSIS    SURGEON:  Jessy Oto, MD     ASSISTANT:  Benjiman Core, PA-C  (Present throughout the entire procedure and necessary for completion of procedure in a timely manner)     ANESTHESIA:  General,supplemented with local anesthesia, marcaine 0.5% 1:1 exparel 1.3% total 20CC, Dr. Roanna Banning.    COMPLICATIONS:  None.   PROCEDURE: The patient was met in the holding area, and the appropriate left arm at the medial elbow identified and marked with an "X" and my initials.     The patient was then transported to OR and was placed on the operative table in a supine position. He was then placed under a general anesthesia and intubated atraumatically. A tourniquet placed about the left upper arm. The left upper extremity was then prepped using sterile conditions with duraprep from the fingertips to the left upper arm and draped using sterile technique. Time-out procedure was called and correct.left upper extremity the was elevated exsanguinated with an Esmarch bandage and tourniquet inflated to 250 mm mercury. Using loope magnification and head lamp. Attention then turned to the left elbow, with the elbow flexed and externally rotated an incision was made approximately 10 cm in length extending from proximal to the medial epicondyle in mind and with the medial epicondyle and then extending to the proximal forearm. Incision through skin subcutaneous layers directly down to the superficial fascia overlying the medial triceps and overlying the medial epicondyle and the superficial fascia to the volar proximal compartment of the forearm. And the fascia along the medial aspect of the distal triceps was then  incised using Metzenbaum scissors the ulnar nerve identified. The fascia was then incised extending proximally over the superficial aspect of the ulnar nerve and the nerve felt to be free. Small bands of fibrous tissue were released and this area and the nerve freed to the region of the arcade of [place]. The nerve was then freed up circumferentially as proximal to the cubital tunnel. Releasing of the nerve with dissection and division of the fiberous tissue superficial to the nerve as it entered the cubital tunnel. The tissue superficial to the ulnar nerve was carefully freed and then divided using Metzenbaum scissors from proximal to distal the superficial portion of the flexor compartment and size. Tourniquet was released small bleeders along the distal bed of the ulnar were cauterized using bipolar cautery. Tourniquet was then released. Total tourniquet time was 13 minutes. The skin and subcutaneous layers the infiltrated  with local anesthesia, marcaine 0.5% 1:1 exparel 1.3% total 20CC. Irrigation was carried out and the incision then closed using interrupted subcutaneous 2-0 Vicryl sutures and a running subcutaneous suture of 3-0 vicryl. Steristrips then applied to the left elbow incision site. Well-padded dressing using Adaptic 4 x 4's ABDs fixed to the elbow with sterile webril. A well padded soft dressing applied to the left arm from above the left wristl level to the upper arm. The patient reactivated and returned to the PACU in good condition. All instruments and sponge counts were correct.   Benjiman Core PA-C perform the duties of assistant surgeon during this case. He was present from the beginning of the case to the end of the case assisting in transfer  the patient from his stretcher to the OR table and back to the stretcher at the end of the case. Assisted in careful suction of the anterior cervical discectomy site and delicate neural structures operating under the operating room  microscope. He performed closure of the incision from the fascia to the skin applying the dressing.          [name]  08/26/2017, 3:06 PM

## 2017-08-26 NOTE — Brief Op Note (Signed)
08/26/2017  3:02 PM  PATIENT:  Gene Anderson  56 y.o. male  PRE-OPERATIVE DIAGNOSIS:  Severe left cubital tunnel syndrome  POST-OPERATIVE DIAGNOSIS:  Severe left cubital tunnel syndrome  PROCEDURE:  Procedure(s): LEFT ELBOW ULNAR NEUROLYSIS (Left)  SURGEON:  Surgeon(s) and Role:    * Jessy Oto, MD - Primary  PHYSICIAN ASSISTANT: Benjiman Core,  ANESTHESIA:   local and general,  Dr. Roanna Banning.  EBL: 60CC  BLOOD ADMINISTERED:none  DRAINS: none   LOCAL MEDICATIONS USED:  MARCAINE 0.5 % 1:1 EXPAREL 1.3%    and Amount: 20 ml  SPECIMEN:  No Specimen  DISPOSITION OF SPECIMEN:  N/A  COUNTS:  YES  TOURNIQUET:   Total Tourniquet Time Documented: Upper Arm (Left) - 13 minutes Total: Upper Arm (Left) - 13 minutes   DICTATION: .Viviann Spare Dictation  PLAN OF CARE: Discharge to home after PACU  PATIENT DISPOSITION:  PACU - hemodynamically stable.   Delay start of Pharmacological VTE agent (>24hrs) due to surgical blood loss or risk of bleeding: not applicable

## 2017-08-26 NOTE — Anesthesia Procedure Notes (Signed)
Procedure Name: LMA Insertion Date/Time: 08/26/2017 2:00 PM Performed by: Hewitt Blade, CRNA Pre-anesthesia Checklist: Patient identified, Emergency Drugs available, Suction available and Patient being monitored Patient Re-evaluated:Patient Re-evaluated prior to induction Oxygen Delivery Method: Circle system utilized Preoxygenation: Pre-oxygenation with 100% oxygen Induction Type: IV induction LMA: LMA inserted LMA Size: 4.0 Number of attempts: 1 Placement Confirmation: positive ETCO2 and breath sounds checked- equal and bilateral Tube secured with: Tape Dental Injury: Teeth and Oropharynx as per pre-operative assessment

## 2017-08-26 NOTE — Anesthesia Postprocedure Evaluation (Signed)
Anesthesia Post Note  Patient: Gene Anderson  Procedure(s) Performed: LEFT ELBOW ULNAR NEUROLYSIS (Left Elbow)     Patient location during evaluation: PACU Anesthesia Type: General Level of consciousness: awake and alert Pain management: pain level controlled Vital Signs Assessment: post-procedure vital signs reviewed and stable Respiratory status: spontaneous breathing, nonlabored ventilation, respiratory function stable and patient connected to nasal cannula oxygen Cardiovascular status: blood pressure returned to baseline and stable Postop Assessment: no apparent nausea or vomiting Anesthetic complications: no    Last Vitals:  Vitals:   08/26/17 1530 08/26/17 1545  BP: (!) 136/106 (!) 129/100  Pulse: 91 99  Resp: 19 (!) 0  Temp:    SpO2: 97% 98%    Last Pain:  Vitals:   08/26/17 1600  TempSrc:   PainSc: [place]

## 2017-08-26 NOTE — Discharge Instructions (Addendum)
Information for Discharge Teaching: EXPAREL (bupivacaine liposome injectable suspension)   Your surgeon gave you EXPAREL(bupivacaine) in your surgical incision to help control your pain after surgery.   EXPAREL is a local anesthetic that provides pain relief by numbing the tissue around the surgical site.  EXPAREL is designed to release pain medication over time and can control pain for up to 72 hours.  Depending on how you respond to EXPAREL, you may require less pain medication during your recovery.  Possible side effects:  Temporary loss of sensation or ability to move in the area where bupivacaine was injected.  Nausea, vomiting, constipation  Rarely, numbness and tingling in your mouth or lips, lightheadedness, or anxiety may occur.  Call your doctor right away if you think you may be experiencing any of these sensations, or if you have other questions regarding possible side effects.  Follow all other discharge instructions given to you by your surgeon or nurse. Eat a healthy diet and drink plenty of water or other fluids.  If you return to the hospital for any reason within 96 hours following the administration of EXPAREL, please inform your health care providers.      Focal Neuropathy Focal neuropathy is a nerve injury that affects one area of the body, such as an arm, a leg, or the face. The injury may involve one nerve or a small group of nerves. Examples of focal neuropathy include brachial plexus injury and [place]-Turner syndrome. What are the causes? This condition may be caused by:  A sudden cut or stretch. This can happen because of an accident or during surgery.  A small injury that happens again and again.  A compression injury. This kind of injury happens when a blood vessel, a tumor, or something else presses on a nerve for a long time.  Entrapment. This can happen to a nerve that has to pass through a narrow area.  Lack of blood to the nerves. This can  be caused by certain medical conditions, like diabetes or vasculitis.  Extreme cold.  Severe burns.  Radiation.  What are the signs or symptoms? Symptoms of this condition depend on where the damaged nerve is and what kind of nerve it is. For example, damage to nerves that carry signals away from the brain can cause symptoms related to movement, but damage to nerves that carry signals to the brain can cause symptoms related to feeling and pain. Symptoms affect only one area of the body. Common symptoms include:  Numbness.  Tingling.  A burning pain.  A prickling feeling.  Very sensitive skin.  Weakness.  Paralysis.  Muscle twitching.  Muscles getting smaller (muscle wasting).  Poor coordination.  How is this diagnosed? This condition may be diagnosed with:  A neurologic exam. During the exam, your health care provider will check your reflexes, how you move, and what you can feel.  Tests. Tests may be done to help find the area that has been damaged. Tests may include: ? Imaging tests, such as a CT scan or MRI. ? Electromyogram (EMG). This test checks the nerves that control your muscles. ? Nerve conduction velocity (NCV) tests. These tests check how quickly messages pass through your nerves.  How is this treated? Treatment for this condition depends on what damaged the nerve and how much of the nerve is damaged. Treatment may involve:  Surgery to ease pressure on a nerve. This may be done if you have a compression injury or entrapment.  Medicines, such as: ?  Medicines for pain, such painkillers, certain anti-seizure medicines, or antidepressants. ? Steroid medicines. These may be given in pill form or with a shot (injection).  Physical therapy (PT) to help movement.  Splints or other devices to help movement.  Follow these instructions at home:   Take over-the-counter and prescription medicines only as told by your health care provider.  If you were given a  splint or other device to help with movement, use it as told by your health care provider.  If any part of your body is numb, check it every day for signs of injury or infection. Watch for: ? Redness, swelling, or pain. ? Fluid or blood. ? Warmth. ? Pus or a bad smell.  Do not do things that put pressure on your damaged nerve. You may have to avoid certain activities or avoid sitting or lying in a certain way.  Do not smoke. Smoking keeps blood from getting to damaged nerves. If you need help quitting, ask your health care provider.  Limit alcohol intake, or avoid alcohol completely. Too much alcohol can cause a lack of B vitamins, which are needed for healthy nerves.  Have a good support system. Coping with focal neuropathy can be stressful. Talk with a mental health caregiver or join a support group if you are struggling.  Keep all follow-up visits as told by your health care provider. This is important. These include PT visits. Contact a health care provider if:  You think you have an injury or infection.  You have new symptoms.  You are struggling emotionally from dealing with your condition. Get help right away if:  You have severe pain that comes on suddenly.  You develop any paralysis.  You lose feeling in any part of your body. This information is not intended to replace advice given to you by your health care provider. Make sure you discuss any questions you have with your health care provider. Document Released: 08/28/2015 Document Revised: 02/22/2016 Document Reviewed: 04/14/2015 Elsevier Interactive Patient Education  2018 Leal dressing dry. Elevated elbow above heart. Apply ice to inner side of elbow two hours on and one half hour off for 48 hours. May Apply ice at night and go to sleep with out changing. Be sure to keep ice off fingers to prevent frost bite.  Return to office in two weeks for incision check and to start exercises.   Post  Anesthesia Home Care Instructions  Activity: Get plenty of rest for the remainder of the day. A responsible individual must stay with you for 24 hours following the procedure.  For the next 24 hours, DO NOT: -Drive a car -Paediatric nurse -Drink alcoholic beverages -Take any medication unless instructed by your physician -Make any legal decisions or sign important papers.  Meals: Start with liquid foods such as gelatin or soup. Progress to regular foods as tolerated. Avoid greasy, spicy, heavy foods. If nausea and/or vomiting occur, drink only clear liquids until the nausea and/or vomiting subsides. Call your physician if vomiting continues.  Special Instructions/Symptoms: Your throat may feel dry or sore from the anesthesia or the breathing tube placed in your throat during surgery. If this causes discomfort, gargle with warm salt water. The discomfort should disappear within 24 hours.  If you had a scopolamine patch placed behind your ear for the management of post- operative nausea and/or vomiting:  1. The medication in the patch is effective for 72 hours, after which it should be removed.  Wrap  patch in a tissue and discard in the trash. Wash hands thoroughly with soap and water. 2. You may remove the patch earlier than 72 hours if you experience unpleasant side effects which may include dry mouth, dizziness or visual disturbances. 3. Avoid touching the patch. Wash your hands with soap and water after contact with the patch.

## 2017-08-27 ENCOUNTER — Encounter (HOSPITAL_BASED_OUTPATIENT_CLINIC_OR_DEPARTMENT_OTHER): Payer: Self-pay | Admitting: Specialist

## 2017-08-27 NOTE — Addendum Note (Signed)
Addendum  created 08/27/17 1256 by Tawni Millers, CRNA   Charge Capture section accepted

## 2017-09-11 ENCOUNTER — Ambulatory Visit (INDEPENDENT_AMBULATORY_CARE_PROVIDER_SITE_OTHER): Payer: Medicaid Other | Admitting: Specialist

## 2017-09-11 ENCOUNTER — Encounter (INDEPENDENT_AMBULATORY_CARE_PROVIDER_SITE_OTHER): Payer: Self-pay | Admitting: Specialist

## 2017-09-11 VITALS — BP 130/88 | HR 93 | Ht 73.0 in | Wt 210.0 lb

## 2017-09-11 DIAGNOSIS — M4722 Other spondylosis with radiculopathy, cervical region: Secondary | ICD-10-CM

## 2017-09-11 DIAGNOSIS — G5622 Lesion of ulnar nerve, left upper limb: Secondary | ICD-10-CM

## 2017-09-11 DIAGNOSIS — M6250 Muscle wasting and atrophy, not elsewhere classified, unspecified site: Secondary | ICD-10-CM

## 2017-09-11 DIAGNOSIS — M7701 Medial epicondylitis, right elbow: Secondary | ICD-10-CM

## 2017-09-11 MED ORDER — GABAPENTIN 300 MG PO CAPS: ORAL_CAPSULE | ORAL | 3 refills | Status: DC

## 2017-09-11 MED ORDER — DICLOFENAC SODIUM 1 % TD GEL: 2.0000 g | Freq: Four times a day (QID) | TRANSDERMAL | 1 refills | Status: DC | PRN

## 2017-09-11 NOTE — Patient Instructions (Addendum)
Plan: May shower and wet the left elbow.. Avoid trauma to the left elbow. Use Ace for 3-4 days . After one week may use the left arm normally. Ice the medial inner right elbow for 15-20 minutes and 47min off.  Exercises involve stretching and heat in the AM and ice at night.

## 2017-09-11 NOTE — Progress Notes (Signed)
   Post-Op Visit Note   Patient: Gene Anderson           Date of Birth: [birth year]           MRN: [id number] Visit Date: 09/11/2017 PCP: Darlyne Russian, MD   Assessment & Plan:2 weeks post left elbow cubital tunnel ulnar neurolysis  Chief Complaint:  Chief Complaint  Patient presents with  . Left Elbow - Routine Post Op  Left elbow incision is healing normally, steristrips replaced. Motor intact left hand with intrinsic wasting 1-2nd interspace. Sensation improved some numbness left little finger.  ACE wrap applied  Visit Diagnoses: No diagnosis found.  Plan: May shower and wet the left elbow.. Avoid trauma to the left elbow. Use Ace for 3-4 days . After one week may use the left arm normally.   Follow-Up Instructions: No Follow-up on file.   Orders:  No orders of the defined types were placed in this encounter.  No orders of the defined types were placed in this encounter.   Imaging: No results found.  PMFS History: Patient Active Problem List   Diagnosis Date Noted  . Cubital tunnel syndrome on left 08/26/2017    Priority: High    Class: Chronic  . Atrial fibrillation ([place]) 04/18/2014  . Paresthesia 02/16/2014  . Neck pain 02/16/2014  . Leukopenia 02/16/2013   Past Medical History:  Diagnosis Date  . Atrial fibrillation ([place])   . Diverticulosis   . Fatty liver 06/20/13  . Hyperplastic colon polyp   . Internal hemorrhoids   . Leukopenia   . Neck pain   . Prostate enlargement   . Spinal stenosis     Family History  Problem Relation Age of Onset  . Cancer Father   . Stroke Maternal Grandfather   . Cancer Paternal Grandmother     Past Surgical History:  Procedure Laterality Date  . CARDIOVERSION  03/2014  . Flexible cystoscopy and repair of torn tunica albuginea    . ULNAR NERVE TRANSPOSITION Left 08/26/2017   Procedure: LEFT ELBOW ULNAR NEUROLYSIS;  Surgeon: Jessy Oto, MD;  Location: [facility];  Service: Orthopedics;   Laterality: Left;   Social History   Occupational History    Employer: [employer]  Tobacco Use  . Smoking status: Current Every Day Smoker    Packs/day: 1.00    Years: 36.00    Pack years: 36.00    Types: Cigarettes  . Smokeless tobacco: Never Used  Substance and Sexual Activity  . Alcohol use: Yes    Comment: social  . Drug use: No  . Sexual activity: Not on file

## 2017-09-21 ENCOUNTER — Encounter (HOSPITAL_COMMUNITY): Payer: Self-pay | Admitting: *Deleted

## 2017-09-21 ENCOUNTER — Emergency Department (HOSPITAL_COMMUNITY)
Admission: EM | Admit: 2017-09-21 | Discharge: 2017-09-21 | Disposition: A | Discharge status: Home / Self Care | Payer: Medicaid Other | Attending: Emergency Medicine | Admitting: Emergency Medicine

## 2017-09-21 ENCOUNTER — Emergency Department (HOSPITAL_COMMUNITY): Payer: Medicaid Other

## 2017-09-21 ENCOUNTER — Other Ambulatory Visit: Payer: Self-pay

## 2017-09-21 DIAGNOSIS — Z7729 Contact with and (suspected ) exposure to other hazardous substances: Secondary | ICD-10-CM | POA: Insufficient documentation

## 2017-09-21 DIAGNOSIS — F17210 Nicotine dependence, cigarettes, uncomplicated: Secondary | ICD-10-CM | POA: Diagnosis not present

## 2017-09-21 DIAGNOSIS — Z79899 Other long term (current) drug therapy: Secondary | ICD-10-CM | POA: Insufficient documentation

## 2017-09-21 DIAGNOSIS — R55 Syncope and collapse: Secondary | ICD-10-CM | POA: Diagnosis present

## 2017-09-21 DIAGNOSIS — J0100 Acute maxillary sinusitis, unspecified: Secondary | ICD-10-CM | POA: Insufficient documentation

## 2017-09-21 LAB — I-STAT ARTERIAL BLOOD GAS, ED
Bicarbonate: 24.6 mmol/L (ref 20.0–28.0)
O2 Saturation: 97 %
PH ART: 7.401 (ref 7.350–7.450)
PO2 ART: 88 mmHg (ref 83.0–108.0)
TCO2: 26 mmol/L (ref 22–32)
pCO2 arterial: 39.5 mmHg (ref 32.0–48.0)

## 2017-09-21 LAB — BASIC METABOLIC PANEL
Anion gap: 8 (ref 5–15)
BUN: 9 mg/dL (ref 6–20)
CHLORIDE: 106 mmol/L (ref 101–111)
CO2: 24 mmol/L (ref 22–32)
CREATININE: 0.87 mg/dL (ref 0.61–1.24)
Calcium: 8.6 mg/dL — ABNORMAL LOW (ref 8.9–10.3)
GFR calc Af Amer: >60 mL/min (ref >60)
GFR calc non Af Amer: >60 mL/min (ref >60)
GLUCOSE: 115 mg/dL — AB (ref 65–99)
Potassium: 3.5 mmol/L (ref 3.5–5.1)
Sodium: 138 mmol/L (ref 135–145)

## 2017-09-21 LAB — TROPONIN I: Troponin I: <0.03 ng/mL (ref <0.03)

## 2017-09-21 LAB — CBC
HCT: 40.9 % (ref 39.0–52.0)
Hemoglobin: 14.1 g/dL (ref 13.0–17.0)
MCH: 32 pg (ref 26.0–34.0)
MCHC: 34.5 g/dL (ref 30.0–36.0)
MCV: 93 fL (ref 78.0–100.0)
PLATELETS: 216 10*3/uL (ref 150–400)
RBC: 4.4 MIL/uL (ref 4.22–5.81)
RDW: 12.7 % (ref 11.5–15.5)
WBC: 6.7 10*3/uL (ref 4.0–10.5)

## 2017-09-21 LAB — COOXEMETRY PANEL
Carboxyhemoglobin: 3.6 % — ABNORMAL HIGH (ref 0.5–1.5)
METHEMOGLOBIN: 1.3 % (ref 0.0–1.5)
O2 Saturation: 91.3 %
TOTAL HEMOGLOBIN: 14.2 g/dL (ref 12.0–16.0)

## 2017-09-21 MED ORDER — METOCLOPRAMIDE HCL 5 MG/ML IJ SOLN
10.0000 mg | Freq: Once | INTRAMUSCULAR | Status: AC
  Administered 2017-09-21: 10 mg via INTRAVENOUS
  Filled 2017-09-21: qty 2

## 2017-09-21 MED ORDER — SODIUM CHLORIDE 0.9 % IV BOLUS (SEPSIS)
1000.0000 mL | Freq: Once | INTRAVENOUS | Status: AC
  Administered 2017-09-21: 1000 mL via INTRAVENOUS

## 2017-09-21 MED ORDER — KETOROLAC TROMETHAMINE 15 MG/ML IJ SOLN
15.0000 mg | Freq: Once | INTRAMUSCULAR | Status: AC
  Administered 2017-09-21: 15 mg via INTRAVENOUS
  Filled 2017-09-21: qty 1

## 2017-09-21 MED ORDER — DEXAMETHASONE SODIUM PHOSPHATE 10 MG/ML IJ SOLN
10.0000 mg | Freq: Once | INTRAMUSCULAR | Status: AC
  Administered 2017-09-21: 10 mg via INTRAVENOUS
  Filled 2017-09-21: qty 1

## 2017-09-21 MED ORDER — AMOXICILLIN-POT CLAVULANATE 875-125 MG PO TABS: 1.0000 | ORAL_TABLET | Freq: Two times a day (BID) | ORAL | 0 refills | Status: AC

## 2017-09-21 MED ORDER — DIPHENHYDRAMINE HCL 50 MG/ML IJ SOLN
25.0000 mg | Freq: Once | INTRAMUSCULAR | Status: AC
  Administered 2017-09-21: 25 mg via INTRAVENOUS
  Filled 2017-09-21: qty 1

## 2017-09-21 NOTE — ED Notes (Signed)
Patient transported to CT 

## 2017-09-21 NOTE — ED Notes (Signed)
ED Provider at bedside. 

## 2017-09-21 NOTE — ED Provider Notes (Signed)
  Physical Exam  BP (!) 157/96 (BP Location: Right Arm)   Pulse (!) 104   Temp 98.2 F (36.8 C) (Oral)   Resp 16   SpO2 98%   Physical Exam  Constitutional: He appears well-developed and well-nourished. No distress.  Nontoxic appearing and in no acute distress.  Oxygen being delivered via face mask.  HENT:  Head: Normocephalic and atraumatic.  Eyes: Conjunctivae and EOM are normal. No scleral icterus.  Neck: Normal range of motion.  Pulmonary/Chest: Effort normal. No respiratory distress.  Neurological: He is alert.  Skin: No rash noted. He is not diaphoretic.  Psychiatric: He has a normal mood and affect.  Nursing note and vitals reviewed.   ED Course/Procedures     Procedures  MDM  Care handed off for me from previous provider, Dr. Ellender Hose.  Briefly, patient presents to ED for evaluation of headache, lightheadedness and difficulty catching breath.  EMS arrived earlier this morning and showed a carboxyhemoglobin level of greater than 10.  Wife and son have levels of 7 and 8 despite not being exposed to smoke.  His lab work today is unremarkable.  Oxyhemoglobin level still elevated but decreased at 3.  CT of his head which was done showed no acute abnormality with the exception of sinusitis.  Will reassess patient after migraine cocktail and oxygen given.  On recheck patient reports complete resolution of his headache and still notes that he feels a lot better.  Will give prescription for antibiotics for sinusitis and advised him to follow-up with primary care provider for further evaluation.  Patient appears stable for discharge at this time.  Strict return precautions given.  Portions of this note were generated with [employer]. Dictation errors may occur despite best attempts at proofreading.        Delia Heady, PA-C 09/21/17 2327    Carmin Muskrat, MD 09/21/17 2328

## 2017-09-21 NOTE — ED Notes (Signed)
Non re breather applied.

## 2017-09-21 NOTE — ED Provider Notes (Signed)
[facility] EMERGENCY DEPARTMENT Provider Note   CSN: [id number] Arrival date & time: 09/21/17  1820     History   Chief Complaint Chief Complaint  Patient presents with  . Generalized Body Aches    HPI Gene Anderson is a 56 y.o. male.  HPI   56 year old male with history of A. fib who presents with headache and syncopal episode.  The patient states that he was in his usual state of health until last night.  He was taking a bath at around 2:58 AM when he began to develop a mild headache, lightheadedness, and the sensation that he could not catch his breath.  He had the fireplace on in the house at that time.  He walked to the bedroom and then reportedly felt lightheaded and believes that he passed out for several minutes.  His family called the police.  They also had some mild headache at the time.  On EMS arrival, the patient was noted to have a carboxyhemoglobin of greater than 10.  His wife and son had levels of 7 and 8 as well, despite not being exposed to smoke.  Per his report, his house was found to have strong gas levels and smell coming from his fireplace.  The fireplace was turned off and symptoms improved.  He was monitored with EMS and eventually cleared.  He states that since then, he has had a mild, left-sided, aching, throbbing headache.  He is also had mild lightheadedness with standing up.  Denies any chest pain.  He states he was in his usual state of health until exposure to the gas smell followed by symptoms yesterday.  Denies any history of syncope.  Denies any other medical complaints.  Past Medical History:  Diagnosis Date  . Atrial fibrillation ([place])   . Diverticulosis   . Fatty liver 06/20/13  . Hyperplastic colon polyp   . Internal hemorrhoids   . Leukopenia   . Neck pain   . Prostate enlargement   . Spinal stenosis     Patient Active Problem List   Diagnosis Date Noted  . Cubital tunnel syndrome on left 08/26/2017    Class: Chronic   . Atrial fibrillation ([place]) 04/18/2014  . Paresthesia 02/16/2014  . Neck pain 02/16/2014  . Leukopenia 02/16/2013    Past Surgical History:  Procedure Laterality Date  . CARDIOVERSION  03/2014  . Flexible cystoscopy and repair of torn tunica albuginea    . ULNAR NERVE TRANSPOSITION Left 08/26/2017   Procedure: LEFT ELBOW ULNAR NEUROLYSIS;  Surgeon: Jessy Oto, MD;  Location: [facility];  Service: Orthopedics;  Laterality: Left;       Home Medications    Prior to Admission medications   Medication Sig Start Date End Date Taking? Authorizing Provider  amoxicillin-clavulanate (AUGMENTIN) 875-125 MG tablet Take 1 tablet by mouth 2 (two) times daily for 10 days. 09/21/17 10/01/17  Duffy Bruce, MD  diclofenac (VOLTAREN) 75 MG EC tablet Take 1 tablet (75 mg total) by mouth 2 (two) times daily. 10/22/16   Jessy Oto, MD  diclofenac sodium (VOLTAREN) 1 % GEL Apply 2 g topically 4 (four) times daily as needed (For pain.). 09/11/17   Jessy Oto, MD  flecainide (TAMBOCOR) 100 MG tablet Take 1 tablet (100 mg total) by mouth daily as needed. for palpitations 04/04/16   Evans Lance, MD  gabapentin (NEURONTIN) 300 MG capsule Take at night time for 3 days then at night and in the  AM for 3 days  And then TID. 09/11/17   Jessy Oto, MD  metoprolol (LOPRESSOR) 50 MG tablet Take 1 tablet (50 mg total) by mouth daily as needed. for palpitations 04/04/16   Evans Lance, MD  naphazoline-pheniramine (NAPHCON-A) 0.025-0.3 % ophthalmic solution Place 1 drop into both eyes 2 (two) times daily as needed for irritation or allergies (USES IN THE MORNING).     [provider]  Oxycodone HCl 20 MG TABS Take 20 mg by mouth 4 (four) times daily as needed. For pain. 08/10/15   [provider]  oxyCODONE-acetaminophen (ROXICET) 5-325 MG tablet Take 1 tablet by mouth every 4 (four) hours as needed for severe pain. 08/26/17   Jessy Oto, MD    Family History Family  History  Problem Relation Age of Onset  . Cancer Father   . Stroke Maternal Grandfather   . Cancer Paternal Grandmother     Social History Social History   Tobacco Use  . Smoking status: Current Every Day Smoker    Packs/day: 1.00    Years: 36.00    Pack years: 36.00    Types: Cigarettes  . Smokeless tobacco: Never Used  Substance Use Topics  . Alcohol use: Yes    Comment: social  . Drug use: No     Allergies   Patient has no known allergies.   Review of Systems Review of Systems  Constitutional: Positive for fatigue.  Musculoskeletal: Positive for arthralgias.  Neurological: Positive for syncope, light-headedness and headaches.  All other systems reviewed and are negative.    Physical Exam Updated Vital Signs BP (!) 157/96 (BP Location: Right Arm)   Pulse (!) 104   Temp 98.2 F (36.8 C) (Oral)   Resp 16   SpO2 98%   Physical Exam  Constitutional: He is oriented to person, place, and time. He appears well-developed and well-nourished. No distress.  HENT:  Head: Normocephalic and atraumatic.  Mildly dry mucous membranes  Eyes: Conjunctivae are normal.  Neck: Neck supple.  Cardiovascular: Normal rate, regular rhythm and normal heart sounds. Exam reveals no friction rub.  No murmur heard. Pulmonary/Chest: Effort normal and breath sounds normal. No respiratory distress. He has no wheezes. He has no rales.  Abdominal: He exhibits no distension.  Musculoskeletal: He exhibits no edema.  Neurological: He is alert and oriented to person, place, and time. He exhibits normal muscle tone.  Skin: Skin is warm. Capillary refill takes less than 2 seconds.  Psychiatric: He has a normal mood and affect.  Nursing note and vitals reviewed.   Neurological Exam:  Mental Status: Alert and oriented to person, place, and time. Attention and concentration normal. Speech clear. Recent memory is intact. Cranial Nerves: Visual fields grossly intact. EOMI and PERRLA. No nystagmus  noted. Facial sensation intact at forehead, maxillary cheek, and chin/mandible bilaterally. No facial asymmetry or weakness. Hearing grossly normal. Uvula is midline, and palate elevates symmetrically. Normal SCM and trapezius strength. Tongue midline without fasciculations. Motor: Muscle strength 5/5 in proximal and distal UE and LE bilaterally. No pronator drift. Muscle tone normal. Reflexes: 2+ and symmetrical in all four extremities.  Sensation: Intact to light touch in upper and lower extremities distally bilaterally.  Gait: Normal without ataxia. Coordination: Normal FTN bilaterally.    ED Treatments / Results  Labs (all labs ordered are listed, but only abnormal results are displayed) Labs Reviewed  BASIC METABOLIC PANEL - Abnormal; Notable for the following components:      Result Value  Glucose, Bld 115 (*)    Calcium 8.6 (*)    All other components within normal limits  COOXEMETRY PANEL - Abnormal; Notable for the following components:   Carboxyhemoglobin 3.6 (*)    All other components within normal limits  CBC  TROPONIN I  I-STAT ARTERIAL BLOOD GAS, ED    EKG  EKG Interpretation  Date/Time:  "Sunday September 21 2017 18:29:57 EST Ventricular Rate:  99 PR Interval:  136 QRS Duration: 84 QT Interval:  340 QTC Calculation: 436 R Axis:   -22 Text Interpretation:  Normal sinus rhythm Normal ECG No significant change since last tracing Confirmed by Janei Scheff ([id number]) on 09/21/2017 7:51:23 PM       Radiology Dg Chest 2 View  Result Date: 09/21/2017 CLINICAL DATA:  chest tightness after being exposed to a fireplace leak 0400am today EXAM: CHEST  2 VIEW COMPARISON:  None. FINDINGS: The heart size and mediastinal contours are within normal limits. The lungs are clear.  No pleural effusion or pneumothorax. Skeletal structures are unremarkable. IMPRESSION: No active cardiopulmonary disease. Electronically Signed   By: David  Ormond M.D.   On: 09/21/2017 19:21   Ct  Head Wo Contrast  Result Date: 09/21/2017 CLINICAL DATA:  56 year old male with altered mental status. EXAM: CT HEAD WITHOUT CONTRAST TECHNIQUE: Contiguous axial images were obtained from the base of the skull through the vertex without intravenous contrast. COMPARISON:  Head CT dated 06/02/2010 FINDINGS: Brain: The ventricles and sulci appropriate size for patient's age. The great white matter discrimination is preserved. There is no acute intracranial hemorrhage. No mass effect or midline shift. No extra-axial fluid collection. Vascular: No hyperdense vessel or unexpected calcification. Skull: Normal. Negative for fracture or focal lesion. Sinuses/Orbits: There is diffuse mucoperiosteal thickening of paranasal sinuses. There is opacification of multiple ethmoid air cells. There is air-fluid level within the sphenoid sinuses, left greater right. The mastoid air cells are clear. Other: None IMPRESSION: 1. Unremarkable noncontrast CT of the brain. 2. Paranasal sinus disease with air-fluid level within the sphenoid sinuses. Clinical correlation is recommended. Electronically Signed   By: Arash  Radparvar M.D.   On: 09/21/2017 20:55    Procedures Procedures (including critical care time)  Medications Ordered in ED Medications  sodium chloride 0.9 % bolus 1,000 mL (1,000 mLs Intravenous New Bag/Given 09/21/17 2130)  metoCLOPramide (REGLAN) injection 10 mg (10 mg Intravenous Given 09/21/17 2131)  diphenhydrAMINE (BENADRYL) injection 25 mg (25 mg Intravenous Given 09/21/17 2131)  ketorolac (TORADOL) 15 MG/ML injection 15 mg (15 mg Intravenous Given 09/21/17 2131)  dexamethasone (DECADRON) injection 10 mg (10 mg Intravenous Given 09/21/17 2131)     Initial Impression / Assessment and Plan / ED Course  I have reviewed the triage vital signs and the nursing notes.  Pertinent labs & imaging results that were available during my care of the patient were reviewed by me and considered in my medical decision  making (see chart for details).     56"  yo M here with HA, lightheadedness after CO exposure. CO >10 yesterday, now 3.6. He has no focal neuro deficits. He does report ? Syncope but EKG non-ischemic, trop neg, normal intervals, and no high risk syncope features. Suspect this was related to his exposure. Will give fluids, O2, and HA meds. CT head neg for bleed or mass. He does have some sinusitis so will tx for this, as this may be contirbuting. Otherwise, he had no preceding or subsequent CP and I do not suspect ACS, arrhythmia, or PE.  He has no signs of CVA or focal neuro deficits. Will tx symptomatically, d/c if improved.  Final Clinical Impressions(s) / ED Diagnoses   Final diagnoses:  Acute non-recurrent maxillary sinusitis    ED Discharge Orders        Ordered    amoxicillin-clavulanate (AUGMENTIN) 875-125 MG tablet  2 times daily     09/21/17 2123       Duffy Bruce, MD 09/21/17 2148

## 2017-09-21 NOTE — ED Notes (Signed)
RT at bedside to obtain ABG.

## 2017-09-21 NOTE — ED Triage Notes (Signed)
The pt had some problem with his fireplace last pm hes afraid that there was some carbon monoxide leak  Ems came out at 0400am checked him out but did not bring him to the hospital.  Today he has a headache  Chest tightness and he either passed out or thought he was going to paSS OUOT ALERT AT PRESENT

## 2017-09-30 ENCOUNTER — Encounter (INDEPENDENT_AMBULATORY_CARE_PROVIDER_SITE_OTHER): Payer: Self-pay | Admitting: Physical Medicine and Rehabilitation

## 2017-10-03 ENCOUNTER — Ambulatory Visit (INDEPENDENT_AMBULATORY_CARE_PROVIDER_SITE_OTHER): Payer: Medicaid Other | Admitting: Specialist

## 2017-10-05 ENCOUNTER — Other Ambulatory Visit: Payer: Self-pay

## 2017-10-05 ENCOUNTER — Ambulatory Visit (HOSPITAL_COMMUNITY)
Admission: EM | Admit: 2017-10-05 | Discharge: 2017-10-05 | Disposition: A | Discharge status: Home / Self Care | Payer: Medicaid Other | Attending: Physician Assistant | Admitting: Physician Assistant

## 2017-10-05 ENCOUNTER — Encounter (HOSPITAL_COMMUNITY): Payer: Self-pay | Admitting: *Deleted

## 2017-10-05 DIAGNOSIS — L509 Urticaria, unspecified: Secondary | ICD-10-CM | POA: Diagnosis not present

## 2017-10-05 DIAGNOSIS — Z880 Allergy status to penicillin: Secondary | ICD-10-CM

## 2017-10-05 MED ORDER — HYDROXYZINE HCL 25 MG PO TABS: 12.5000 mg | ORAL_TABLET | Freq: Three times a day (TID) | ORAL | 0 refills | Status: DC | PRN

## 2017-10-05 MED ORDER — LEVOCETIRIZINE DIHYDROCHLORIDE 5 MG PO TABS: 5.0000 mg | ORAL_TABLET | Freq: Every evening | ORAL | 0 refills | Status: DC

## 2017-10-05 NOTE — ED Triage Notes (Signed)
Reports was given Rx for Augmentin for sinusitis from ED 12/26.  Pt had taken some doses for couple days when he noticed pruritis starting 6 days ago.  Was told to stop abx and to take Benadryl.  Has been taking Benadryl 50mg  q hs and applying Benadryl cream, but continues with hives and pruritis.  Denies any throat swelling or itching or any breathing problems.

## 2017-10-05 NOTE — ED Provider Notes (Signed)
10/05/2017 6:03 PM   DOB: 09/06/1961 / MRN: [id number]  SUBJECTIVE:  Gene Anderson is a 57 y.o. male presenting for rash.  Per triage:  ReportReports was given Rx for Augmentin for sinusitis from ED 12/26.  Pt had taken some doses for couple days when he noticed pruritis starting 6 days ago.  Was told to stop abx and to take Benadryl.  Has been taking Benadryl 50mg  q hs and applying Benadryl cream, but continues with hives and pruritis.  Denies any throat swelling or itching or any breathing problems.s was given Rx for Augmentin for sinusitis from ED 12/26.  Pt had taken some doses for couple days when he noticed pruritis starting 6 days ago.  Was told to stop abx and to take Benadryl.  Has been taking Benadryl 50mg  q hs and applying Benadryl cream, but continues with hives and pruritis.  Denies any throat swelling or itching or any breathing problems.  Patient tells me the rash started on his chest and has mostly resolved, however now he continues to have itching about the torso.   He is allergic to augmentin [amoxicillin-pot clavulanate].   He  has a past medical history of Atrial fibrillation ([place]), Diverticulosis, Fatty liver (06/20/13), Hyperplastic colon polyp, Internal hemorrhoids, Leukopenia, Neck pain, Prostate enlargement, and Spinal stenosis.    He  reports that he has been smoking cigarettes.  He has a 36.00 pack-year smoking history. he has never used smokeless tobacco. He reports that he drinks alcohol. He reports that he does not use drugs. He  has no sexual activity history on file. The patient  has a past surgical history that includes Flexible cystoscopy and repair of torn tunica albuginea; Cardioversion (03/2014); and Ulnar nerve transposition (Left, 08/26/2017).  His family history includes Cancer in his father and paternal grandmother; Stroke in his maternal grandfather.  Review of Systems  Skin: Positive for itching and rash.    OBJECTIVE:  BP (!) 142/89   Pulse 91   Temp  98.2 F (36.8 C) (Oral)   Resp 16   SpO2 100%   Physical Exam  Constitutional: He is active and cooperative.  Cardiovascular: Normal rate.  Pulmonary/Chest: Effort normal. No tachypnea.  Neurological: He is alert.  Skin: Skin is warm. No rash noted.  Vitals reviewed.   No results found for this or any previous visit (from the past 72 hour(s)).  No results found.  ASSESSMENT AND PLAN:  Urticaria: Likely resolving morbiliform rash.  He will continue benadryl cream and will stop benadryl and start xyzal and hydrox for pruritis.   Penicillin allergy      The patient is advised to call or return to clinic if he does not see an improvement in symptoms, or to seek the care of the closest emergency department if he worsens with the above plan.   [name], MHS, PA-C 10/05/2017 6:03 PM    [name], PA-C 10/05/17 [name]

## 2017-11-09 ENCOUNTER — Other Ambulatory Visit (INDEPENDENT_AMBULATORY_CARE_PROVIDER_SITE_OTHER): Payer: Self-pay | Admitting: Specialist

## 2017-11-09 DIAGNOSIS — M48062 Spinal stenosis, lumbar region with neurogenic claudication: Secondary | ICD-10-CM

## 2017-11-09 DIAGNOSIS — M1711 Unilateral primary osteoarthritis, right knee: Secondary | ICD-10-CM

## 2017-11-10 NOTE — Telephone Encounter (Signed)
Diclofenac Refill request 

## 2017-12-09 ENCOUNTER — Ambulatory Visit (INDEPENDENT_AMBULATORY_CARE_PROVIDER_SITE_OTHER): Payer: No Typology Code available for payment source

## 2017-12-09 ENCOUNTER — Ambulatory Visit (INDEPENDENT_AMBULATORY_CARE_PROVIDER_SITE_OTHER): Payer: No Typology Code available for payment source | Admitting: Specialist

## 2017-12-09 ENCOUNTER — Ambulatory Visit (INDEPENDENT_AMBULATORY_CARE_PROVIDER_SITE_OTHER): Payer: Medicaid Other

## 2017-12-09 ENCOUNTER — Encounter (INDEPENDENT_AMBULATORY_CARE_PROVIDER_SITE_OTHER): Payer: Self-pay | Admitting: Specialist

## 2017-12-09 VITALS — BP 137/87 | HR 98

## 2017-12-09 DIAGNOSIS — M25561 Pain in right knee: Secondary | ICD-10-CM | POA: Diagnosis not present

## 2017-12-09 DIAGNOSIS — M6250 Muscle wasting and atrophy, not elsewhere classified, unspecified site: Secondary | ICD-10-CM | POA: Diagnosis not present

## 2017-12-09 DIAGNOSIS — M25562 Pain in left knee: Secondary | ICD-10-CM

## 2017-12-09 DIAGNOSIS — M1711 Unilateral primary osteoarthritis, right knee: Secondary | ICD-10-CM | POA: Diagnosis not present

## 2017-12-09 DIAGNOSIS — G5622 Lesion of ulnar nerve, left upper limb: Secondary | ICD-10-CM | POA: Diagnosis not present

## 2017-12-09 DIAGNOSIS — G8929 Other chronic pain: Secondary | ICD-10-CM

## 2017-12-09 DIAGNOSIS — M5442 Lumbago with sciatica, left side: Secondary | ICD-10-CM

## 2017-12-09 DIAGNOSIS — M4807 Spinal stenosis, lumbosacral region: Secondary | ICD-10-CM | POA: Diagnosis not present

## 2017-12-09 DIAGNOSIS — M4316 Spondylolisthesis, lumbar region: Secondary | ICD-10-CM | POA: Diagnosis not present

## 2017-12-09 DIAGNOSIS — M48062 Spinal stenosis, lumbar region with neurogenic claudication: Secondary | ICD-10-CM | POA: Diagnosis not present

## 2017-12-09 DIAGNOSIS — M4722 Other spondylosis with radiculopathy, cervical region: Secondary | ICD-10-CM | POA: Diagnosis not present

## 2017-12-09 MED ORDER — CELECOXIB 200 MG PO CAPS: 200.0000 mg | ORAL_CAPSULE | Freq: Two times a day (BID) | ORAL | 3 refills | Status: DC

## 2017-12-09 MED ORDER — GABAPENTIN 300 MG PO CAPS: ORAL_CAPSULE | ORAL | 3 refills | Status: DC

## 2017-12-09 NOTE — Patient Instructions (Signed)
  Knee is suffering from osteoarthritis, only real proven treatments are Weight loss, NSIADs like celebrex and exercise. Well padded shoes help. Ice the knee 2-3 times a day 15-20 mins at a time. Avoid bending, stooping and avoid lifting weights greater than 10 lbs. Avoid prolong standing and walking. Avoid frequent bending and stooping  No lifting greater than 10 lbs. May use ice or moist heat for pain. Weight loss is of benefit. Handicap license is approved.

## 2017-12-09 NOTE — Progress Notes (Signed)
Office Visit Note   Patient: MYLEN MANGAN           Date of Birth: [birth year]           MRN: [id number] Visit Date: 12/09/2017              Requested by: Darlyne Russian, MD [place], [place] [address] PCP: Darlyne Russian, MD   Assessment & Plan: Visit Diagnoses:  1. Chronic pain of both knees   2. Chronic bilateral low back pain with left-sided sciatica     Plan:  Knee is suffering from osteoarthritis, only real proven treatments are Weight loss, NSIADs like celebrex and exercise. Well padded shoes help. Ice the knee 2-3 times a day 15-20 mins at a time. Avoid bending, stooping and avoid lifting weights greater than 10 lbs. Avoid prolong standing and walking. Avoid frequent bending and stooping  No lifting greater than 10 lbs. May use ice or moist heat for pain. Weight loss is of benefit. Handicap license is approved.   Follow-Up Instructions: No Follow-up on file.   Orders:  Orders Placed This Encounter  Procedures  . XR Knee 1-2 Views Left  . XR Knee 1-2 Views Right  . XR Lumbar Spine 2-3 Views   No orders of the defined types were placed in this encounter.     Procedures: No procedures performed   Clinical Data: No additional findings.   Subjective: Chief Complaint  Patient presents with  . Left Knee - Pain  . Right Knee - Pain  . Lower Back - Pain    57 year old male with hand numbness and tingling. He underwent left ulnar neurolysis 08/26/2017 for severe left ulnar nerve symptoms  Into the left ulnar 2 digits. He has had chronic pain in his back and neck and in the past was unable to afford to have an MRI. His primary care MD, Dr. Tillman Sers had precribed pain meds in the past since 2014. He compains of pain radiating into both thighs and legs and the back of both thighs and now below the knee. He has been seen by Dr.Regal a podiatrist. No bowel or  Bladder difficulties. He has trouble walking a distance, can walk a  1/2 block. Lying down is better. Not doing shopping, avoiding prolong standing.    Review of Systems  Constitutional: Negative.   HENT: Negative.   Eyes: Negative.   Respiratory: Negative.   Cardiovascular: Negative.   Gastrointestinal: Negative.   Endocrine: Negative.   Genitourinary: Negative.   Musculoskeletal: Negative.   Skin: Negative.   Allergic/Immunologic: Negative.   Neurological: Negative.   Hematological: Negative.   Psychiatric/Behavioral: Negative.      Objective: Vital Signs: BP 137/87   Pulse 98   Physical Exam  Constitutional: He is oriented to person, place, and time. He appears well-developed and well-nourished.  HENT:  Head: Normocephalic and atraumatic.  Eyes: EOM are normal. Pupils are equal, round, and reactive to light.  Neck: Normal range of motion. Neck supple.  Pulmonary/Chest: Effort normal and breath sounds normal.  Abdominal: Soft. Bowel sounds are normal.  Musculoskeletal: Normal range of motion.  Neurological: He is alert and oriented to person, place, and time.  Skin: Skin is warm and dry.  Psychiatric: He has a normal mood and affect. His behavior is normal. Judgment and thought content normal.    Left Elbow Exam   Tenderness  The patient is experiencing tenderness in the medial  epicondyle.   Range of Motion  Extension: normal  Flexion: normal  Pronation: normal  Supination: normal   Muscle Strength  Pronation:  5/5   Tests  Varus: negative Valgus: negative Tinel's sign (cubital tunnel): positive  Other  Scars: present Sensation: decreased Pulse: present  Comments:  Left hand with complaints of persistent numbness of left little finger.  Left hand finger abduction is weak left hand.       Specialty Comments:  No specialty comments available.  Imaging: No results found.   PMFS History: Patient Active Problem List   Diagnosis Date Noted  . Cubital tunnel syndrome on left 08/26/2017    Priority: High     Class: Chronic  . Atrial fibrillation ([place]) 04/18/2014  . Paresthesia 02/16/2014  . Neck pain 02/16/2014  . Leukopenia 02/16/2013   Past Medical History:  Diagnosis Date  . Atrial fibrillation ([place])   . Diverticulosis   . Fatty liver 06/20/13  . Hyperplastic colon polyp   . Internal hemorrhoids   . Leukopenia   . Neck pain   . Prostate enlargement   . Spinal stenosis     Family History  Problem Relation Age of Onset  . Cancer Father   . Stroke Maternal Grandfather   . Cancer Paternal Grandmother     Past Surgical History:  Procedure Laterality Date  . CARDIOVERSION  03/2014  . Flexible cystoscopy and repair of torn tunica albuginea    . ULNAR NERVE TRANSPOSITION Left 08/26/2017   Procedure: LEFT ELBOW ULNAR NEUROLYSIS;  Surgeon: Jessy Oto, MD;  Location: [facility];  Service: Orthopedics;  Laterality: Left;   Social History   Occupational History    Employer: [employer]  Tobacco Use  . Smoking status: Current Every Day Smoker    Packs/day: 1.00    Years: 36.00    Pack years: 36.00    Types: Cigarettes  . Smokeless tobacco: Never Used  Substance and Sexual Activity  . Alcohol use: Yes    Comment: social  . Drug use: No  . Sexual activity: Not on file

## 2017-12-16 ENCOUNTER — Telehealth (INDEPENDENT_AMBULATORY_CARE_PROVIDER_SITE_OTHER): Payer: Self-pay | Admitting: Radiology

## 2017-12-16 ENCOUNTER — Telehealth (INDEPENDENT_AMBULATORY_CARE_PROVIDER_SITE_OTHER): Payer: Self-pay | Admitting: Specialist

## 2017-12-16 NOTE — Telephone Encounter (Signed)
Pt is requesting a referral

## 2017-12-16 NOTE — Telephone Encounter (Signed)
Can you call and resched patient for after 01/09/18 with Dr. Louanne Skye.  He needs to have MRI and nerve conduction study before he follows up.  Thanks.

## 2017-12-16 NOTE — Telephone Encounter (Signed)
Where is he wanting a referral to?

## 2017-12-16 NOTE — Telephone Encounter (Signed)
I sched pt for 01/12/18 it was a new pt spot I am sorry for that I didn't see anymore availability  and I didn't want to stall out pt care. If I need to change it I will.

## 2017-12-17 NOTE — Telephone Encounter (Signed)
Ill have to call him back

## 2017-12-18 ENCOUNTER — Other Ambulatory Visit: Payer: Medicaid Other

## 2017-12-21 ENCOUNTER — Ambulatory Visit
Admission: RE | Admit: 2017-12-21 | Discharge: 2017-12-21 | Disposition: A | Discharge status: Home / Self Care | Payer: 59 | Source: Ambulatory Visit | Attending: Specialist | Admitting: Specialist

## 2017-12-21 DIAGNOSIS — M4807 Spinal stenosis, lumbosacral region: Secondary | ICD-10-CM

## 2017-12-21 DIAGNOSIS — M4316 Spondylolisthesis, lumbar region: Secondary | ICD-10-CM

## 2017-12-25 ENCOUNTER — Ambulatory Visit (INDEPENDENT_AMBULATORY_CARE_PROVIDER_SITE_OTHER): Payer: Medicaid Other | Admitting: Surgery

## 2018-01-09 ENCOUNTER — Encounter (INDEPENDENT_AMBULATORY_CARE_PROVIDER_SITE_OTHER): Payer: Medicaid Other | Admitting: Physical Medicine and Rehabilitation

## 2018-01-12 ENCOUNTER — Encounter (INDEPENDENT_AMBULATORY_CARE_PROVIDER_SITE_OTHER): Payer: Self-pay | Admitting: Specialist

## 2018-01-12 ENCOUNTER — Ambulatory Visit (INDEPENDENT_AMBULATORY_CARE_PROVIDER_SITE_OTHER): Payer: Medicaid Other | Admitting: Specialist

## 2018-01-12 VITALS — BP 150/99 | HR 68 | Temp 97.8°F | Ht 73.0 in | Wt 210.0 lb

## 2018-01-12 DIAGNOSIS — M4316 Spondylolisthesis, lumbar region: Secondary | ICD-10-CM

## 2018-01-12 DIAGNOSIS — M170 Bilateral primary osteoarthritis of knee: Secondary | ICD-10-CM

## 2018-01-12 DIAGNOSIS — D1779 Benign lipomatous neoplasm of other sites: Secondary | ICD-10-CM

## 2018-01-12 DIAGNOSIS — M48062 Spinal stenosis, lumbar region with neurogenic claudication: Secondary | ICD-10-CM | POA: Diagnosis not present

## 2018-01-12 DIAGNOSIS — M25461 Effusion, right knee: Secondary | ICD-10-CM

## 2018-01-12 MED ORDER — BUPIVACAINE HCL 0.25 % IJ SOLN
4.0000 mL | INTRAMUSCULAR | Status: AC | PRN
  Administered 2018-01-12: 4 mL via INTRA_ARTICULAR

## 2018-01-12 MED ORDER — CELECOXIB 200 MG PO CAPS: 200.0000 mg | ORAL_CAPSULE | Freq: Two times a day (BID) | ORAL | 3 refills | Status: DC

## 2018-01-12 MED ORDER — METHYLPREDNISOLONE ACETATE 40 MG/ML IJ SUSP
40.0000 mg | INTRAMUSCULAR | Status: AC | PRN
  Administered 2018-01-12: 40 mg via INTRA_ARTICULAR

## 2018-01-12 NOTE — Patient Instructions (Addendum)
Plan:Avoid bending, stooping and avoid lifting weights greater than 10 lbs. Avoid prolong standing and walking. Avoid frequent bending and stooping  No lifting greater than 10 lbs. May use ice or moist heat for pain. Weight loss is of benefit. Handicap license is approved. The main ways of treat osteoarthritis, that are found to be success. Weight loss helps to decrease pain. Exercise is important to maintaining cartilage and thickness and strengthening. NSAIDs like motrin, tylenol, alleve are meds decreasing the inflamation. Ice is okay  In afternoon and evening and hot shower in the am

## 2018-01-12 NOTE — Progress Notes (Addendum)
Office Visit Note   Patient: Gene Anderson           Date of Birth: 1961-08-09           MRN: [id number] Visit Date: 01/12/2018              Requested by: Darlyne Russian, MD [place],  [id number] PCP: Darlyne Russian, MD   Assessment & Plan: Visit Diagnoses: No diagnosis found.  Plan:Avoid bending, stooping and avoid lifting weights greater than 10 lbs. Avoid prolong standing and walking. Avoid frequent bending and stooping  No lifting greater than 10 lbs. May use ice or moist heat for pain. Weight loss is of benefit. Handicap license is approved.  The main ways of treat osteoarthritis, that are found to be success. Weight loss helps to decrease pain. Exercise is important to maintaining cartilage and thickness and strengthening. NSAIDs like motrin, tylenol, alleve are meds decreasing the inflamation. Ice is okay  In afternoon and evening and hot shower in the am Follow-Up Instructions: No follow-ups on file.   Orders:  No orders of the defined types were placed in this encounter.  No orders of the defined types were placed in this encounter.     Procedures: Large Joint Inj: R knee on 01/12/2018 3:27 PM Indications: pain Details: 25 G 1.5 in needle, anterolateral approach  Arthrogram: No  Medications: 40 mg methylPREDNISolone acetate 40 MG/ML; 4 mL bupivacaine 0.25 % Aspirate: 30 mL yellow and clear Outcome: tolerated well, no immediate complications  Bandaid applied. Procedure, treatment alternatives, risks and benefits explained, specific risks discussed. Consent was given by the patient. Immediately prior to procedure a time out was called to verify the correct patient, procedure, equipment, support staff and site/side marked as required. Patient was prepped and draped in the usual sterile fashion.   Large Joint Inj: L knee on 01/12/2018 3:28 PM Indications: pain Details: 25 G 1.5 in needle, anterolateral approach  Arthrogram:  No  Medications: 40 mg methylPREDNISolone acetate 40 MG/ML; 4 mL bupivacaine 0.25 % Outcome: tolerated well, no immediate complications  Bandaid applied. Procedure, treatment alternatives, risks and benefits explained, specific risks discussed. Consent was given by the patient. Immediately prior to procedure a time out was called to verify the correct patient, procedure, equipment, support staff and site/side marked as required. Patient was prepped and draped in the usual sterile fashion.       Clinical Data: Findings:  MRI of the lumbar spine shows Epidural lipomatosis.L3-4,L4-5 and L5-S1. Worsened since last study 2014.     Subjective: Chief Complaint  Patient presents with  . Lower Back - Follow-up  . MRI Review    57 year old  Male with history of low back pain and radiation into the legs with standing and walking. He is limited in walking to 100 yards, he says 25 yards. .Taking celebrex for arthtritis. Diclofenac works beter than .   Review of Systems  Constitutional: Negative.   HENT: Negative.   Eyes: Negative.   Respiratory: Negative.   Cardiovascular: Negative.   Gastrointestinal: Negative.   Endocrine: Negative.   Genitourinary: Negative.   Musculoskeletal: Negative.   Skin: Negative.   Allergic/Immunologic: Negative.   Neurological: Negative.   Hematological: Negative.   Psychiatric/Behavioral: Negative.      Objective: Vital Signs: BP (!) 150/99   Pulse 68   Temp 97.8 F (36.6 C)   Ht 6\' 1"  (1.854 m)   Wt 210 lb (95.3  kg)   BMI 27.71 kg/m   Physical Exam  Constitutional: He is oriented to person, place, and time. He appears well-developed and well-nourished.  HENT:  Head: Normocephalic and atraumatic.  Eyes: Pupils are equal, round, and reactive to light. EOM are normal.  Neck: Normal range of motion. Neck supple.  Pulmonary/Chest: Effort normal and breath sounds normal.  Abdominal: Soft. Bowel sounds are normal.  Neurological: He is alert  and oriented to person, place, and time.  Skin: Skin is warm and dry.  Psychiatric: He has a normal mood and affect. His behavior is normal. Judgment and thought content normal.    Back Exam   Tenderness  The patient is experiencing tenderness in the lumbar.  Range of Motion  Extension: abnormal  Flexion: abnormal  Lateral bend right: abnormal  Lateral bend left: abnormal  Rotation right: abnormal   Muscle Strength  Right Quadriceps:  5/5  Left Quadriceps:  5/5  Right Hamstrings:  5/5  Left Hamstrings:  5/5   Tests  Straight leg raise right: negative Straight leg raise left: negative  Reflexes  Patellar:  2/4 normal Achilles:  2/4 normal Biceps: normal Babinski's sign: normal   Other  Toe walk: normal Heel walk: normal Sensation: normal Gait: normal  Erythema: no back redness Scars: present      Specialty Comments:  No specialty comments available.  Imaging: No results found.   PMFS History: Patient Active Problem List   Diagnosis Date Noted  . Cubital tunnel syndrome on left 08/26/2017    Priority: High    Class: Chronic  . Atrial fibrillation ([place]) 04/18/2014  . Paresthesia 02/16/2014  . Neck pain 02/16/2014  . Leukopenia 02/16/2013   Past Medical History:  Diagnosis Date  . Atrial fibrillation ([place])   . Diverticulosis   . Fatty liver 06/20/13  . Hyperplastic colon polyp   . Internal hemorrhoids   . Leukopenia   . Neck pain   . Prostate enlargement   . Spinal stenosis     Family History  Problem Relation Age of Onset  . Cancer Father   . Stroke Maternal Grandfather   . Cancer Paternal Grandmother     Past Surgical History:  Procedure Laterality Date  . CARDIOVERSION  03/2014  . Flexible cystoscopy and repair of torn tunica albuginea    . ULNAR NERVE TRANSPOSITION Left 08/26/2017   Procedure: LEFT ELBOW ULNAR NEUROLYSIS;  Surgeon: Jessy Oto, MD;  Location: [facility];  Service: Orthopedics;  Laterality: Left;    Social History   Occupational History    Employer: [employer]  Tobacco Use  . Smoking status: Current Every Day Smoker    Packs/day: 1.00    Years: 36.00    Pack years: 36.00    Types: Cigarettes  . Smokeless tobacco: Never Used  Substance and Sexual Activity  . Alcohol use: Yes    Comment: social  . Drug use: No  . Sexual activity: Not on file

## 2018-01-20 ENCOUNTER — Telehealth (INDEPENDENT_AMBULATORY_CARE_PROVIDER_SITE_OTHER): Payer: Self-pay | Admitting: Specialist

## 2018-01-20 ENCOUNTER — Encounter (INDEPENDENT_AMBULATORY_CARE_PROVIDER_SITE_OTHER): Payer: Medicaid Other | Admitting: Physical Medicine and Rehabilitation

## 2018-01-20 NOTE — Telephone Encounter (Signed)
Appt 02/11/18 9:30am Gene Anderson   Appt 02/16/18 1:30pm [name]     I sched pt in new pt spot due to the resched of nerve conduction study. Pt was more than 15 mins late for his appt.

## 2018-01-24 ENCOUNTER — Ambulatory Visit: Payer: Medicaid Other | Admitting: Physician Assistant

## 2018-01-26 ENCOUNTER — Ambulatory Visit (INDEPENDENT_AMBULATORY_CARE_PROVIDER_SITE_OTHER): Payer: No Typology Code available for payment source

## 2018-01-26 ENCOUNTER — Other Ambulatory Visit: Payer: Self-pay

## 2018-01-26 ENCOUNTER — Ambulatory Visit (INDEPENDENT_AMBULATORY_CARE_PROVIDER_SITE_OTHER): Payer: Medicaid Other | Admitting: Specialist

## 2018-01-26 ENCOUNTER — Encounter: Payer: Self-pay | Admitting: Physician Assistant

## 2018-01-26 ENCOUNTER — Ambulatory Visit (INDEPENDENT_AMBULATORY_CARE_PROVIDER_SITE_OTHER): Payer: Medicaid Other | Admitting: Physician Assistant

## 2018-01-26 VITALS — BP 120/90 | HR 68 | Temp 98.0°F | Resp 16 | Ht 74.0 in | Wt 222.4 lb

## 2018-01-26 DIAGNOSIS — N50812 Left testicular pain: Secondary | ICD-10-CM | POA: Diagnosis not present

## 2018-01-26 DIAGNOSIS — G8929 Other chronic pain: Secondary | ICD-10-CM

## 2018-01-26 DIAGNOSIS — R1032 Left lower quadrant pain: Secondary | ICD-10-CM | POA: Diagnosis not present

## 2018-01-26 LAB — POCT URINALYSIS DIP (MANUAL ENTRY)
BILIRUBIN UA: NEGATIVE
BILIRUBIN UA: NEGATIVE mg/dL
Glucose, UA: NEGATIVE mg/dL
Leukocytes, UA: NEGATIVE
Nitrite, UA: NEGATIVE
PH UA: 6.5 (ref 5.0–8.0)
Protein Ur, POC: NEGATIVE mg/dL
SPEC GRAV UA: 1.02 (ref 1.010–1.025)
Urobilinogen, UA: 0.2 E.U./dL

## 2018-01-26 LAB — POCT CBC
Granulocyte percent: 40.5 %G (ref 37–80)
HEMATOCRIT: 46 % (ref 43.5–53.7)
HEMOGLOBIN: 15.2 g/dL (ref 14.1–18.1)
Lymph, poc: 1.6 (ref 0.6–3.4)
MCH: 31.2 pg (ref 27–31.2)
MCHC: 33 g/dL (ref 31.8–35.4)
MCV: 94.6 fL (ref 80–97)
MID (CBC): 0.6 (ref 0–0.9)
MPV: 7.2 fL (ref 0–99.8)
POC GRANULOCYTE: 1.6 — AB (ref 2–6.9)
POC LYMPH PERCENT: 43.6 %L (ref 10–50)
POC MID %: 15.9 % — AB (ref 0–12)
Platelet Count, POC: 261 10*3/uL (ref 142–424)
RBC: 4.87 M/uL (ref 4.69–6.13)
RDW, POC: 13.4 %
WBC: 3.7 10*3/uL — AB (ref 4.6–10.2)

## 2018-01-26 LAB — POC MICROSCOPIC URINALYSIS (UMFC): Mucus: ABSENT

## 2018-01-26 NOTE — Progress Notes (Signed)
01/26/2018 12:04 PM   DOB: 09-28-1961 / MRN: [id number]  SUBJECTIVE:  Gene Anderson is a 57 y.o. male presenting for pain in the left testicle x1 week.  The patient has not tried anything for this.  He denies hematuria.  He denies dysuria urgency and frequency.  He is in a monogamous relationship with his wife of many years and does not suspect infidelity.  The pain is not worsened by bowel movements or heavy lifting.  He has no history of stones that he knows of.  He does have a history of leukopenia.  He is allergic to augmentin [amoxicillin-pot clavulanate].   He  has a past medical history of Atrial fibrillation ([place]), Diverticulosis, Fatty liver (06/20/13), Hyperplastic colon polyp, Internal hemorrhoids, Leukopenia, Neck pain, Prostate enlargement, and Spinal stenosis.    He  reports that he has been smoking cigarettes.  He has a 36.00 pack-year smoking history. He has never used smokeless tobacco. He reports that he drinks alcohol. He reports that he does not use drugs. He  has no sexual activity history on file. The patient  has a past surgical history that includes Flexible cystoscopy and repair of torn tunica albuginea; Cardioversion (03/2014); and Ulnar nerve transposition (Left, 08/26/2017).  His family history includes Cancer in his father and paternal grandmother; Stroke in his maternal grandfather.  Review of Systems  Constitutional: Negative for chills, diaphoresis and fever.  Eyes: Negative.   Respiratory: Negative for shortness of breath.   Cardiovascular: Negative for chest pain, orthopnea and leg swelling.  Gastrointestinal: Negative for nausea.  Genitourinary: Negative for dysuria, flank pain, frequency, hematuria and urgency.  Musculoskeletal: Negative for myalgias.  Skin: Negative for rash.  Neurological: Negative for dizziness, sensory change, speech change, focal weakness and headaches.    The problem list and medications were reviewed and updated by myself where  necessary and exist elsewhere in the encounter.   OBJECTIVE:  BP 120/90 (BP Location: Left Arm, Cuff Size: Large)   Pulse 68   Temp 98 F (36.7 C) (Oral)   Resp 16   Ht 6\' 2"  (1.88 m)   Wt 222 lb 6.4 oz (100.9 kg)   SpO2 99%   BMI 28.55 kg/m   Physical Exam  Constitutional: He is oriented to person, place, and time. He appears well-developed. He does not appear ill.  Eyes: Pupils are equal, round, and reactive to light. Conjunctivae and EOM are normal.  Cardiovascular: Normal rate.  Pulmonary/Chest: Effort normal.  Abdominal: He exhibits no distension. Hernia confirmed negative in the right inguinal area and confirmed negative in the left inguinal area.  Genitourinary: Testes normal and penis normal. Right testis shows no mass, no swelling and no tenderness. Right testis is descended. Cremasteric reflex is not absent on the right side. Left testis shows no mass, no swelling and no tenderness. Left testis is descended. Cremasteric reflex is not absent on the left side. No penile tenderness. No discharge found.  Musculoskeletal: Normal range of motion.  Lymphadenopathy: No inguinal adenopathy noted on the right or left side.  Neurological: He is alert and oriented to person, place, and time. No cranial nerve deficit. Coordination normal.  Skin: Skin is warm and dry. He is not diaphoretic.  Psychiatric: He has a normal mood and affect.  Nursing note and vitals reviewed.   Results for orders placed or performed in visit on 01/26/18 (from the past 72 hour(s))  POCT urinalysis dipstick     Status: Abnormal   Collection Time:  01/26/18 11:24 AM  Result Value Ref Range   Color, UA yellow yellow   Clarity, UA clear clear   Glucose, UA negative negative mg/dL   Bilirubin, UA negative negative   Ketones, POC UA negative negative mg/dL   Spec Grav, UA 1.020 1.010 - 1.025   Blood, UA small (A) negative   pH, UA 6.5 5.0 - 8.0   Protein Ur, POC negative negative mg/dL   Urobilinogen, UA  0.2 0.2 or 1.0 E.U./dL   Nitrite, UA Negative Negative   Leukocytes, UA Negative Negative  POCT Microscopic Urinalysis ([facility])     Status: Abnormal   Collection Time: 01/26/18 11:29 AM  Result Value Ref Range   WBC,UR,HPF,POC None None WBC/hpf   RBC,UR,HPF,POC Few (A) None RBC/hpf   Bacteria None None, Too numerous to count   Mucus Absent Absent   Epithelial Cells, UR Per Microscopy None None, Too numerous to count cells/hpf  POCT CBC     Status: Abnormal   Collection Time: 01/26/18 11:32 AM  Result Value Ref Range   WBC 3.7 (A) 4.6 - 10.2 K/uL   Lymph, poc 1.6 0.6 - 3.4   POC LYMPH PERCENT 43.6 10 - 50 %L   MID (cbc) 0.6 0 - 0.9   POC MID % 15.9 (A) 0 - 12 %M   POC Granulocyte 1.6 (A) 2 - 6.9   Granulocyte percent 40.5 37 - 80 %G   RBC 4.87 4.69 - 6.13 M/uL   Hemoglobin 15.2 14.1 - 18.1 g/dL   HCT, POC 46.0 43.5 - 53.7 %   MCV 94.6 80 - 97 fL   MCH, POC 31.2 27 - 31.2 pg   MCHC 33.0 31.8 - 35.4 g/dL   RDW, POC 13.4 %   Platelet Count, POC 261 142 - 424 K/uL   MPV 7.2 0 - 99.8 fL     CBC Latest Ref Rng & Units 01/26/2018 09/21/2017 04/08/2014  WBC 4.6 - 10.2 K/uL 3.7(A) 6.7 4.7  Hemoglobin 14.1 - 18.1 g/dL 15.2 14.1 16.2  Hematocrit 43.5 - 53.7 % 46.0 40.9 45.7  Platelets 150 - 400 K/uL - 216 270     Dg Abd 1 View  Result Date: 01/26/2018 CLINICAL DATA:  Hematuria and left scrotal region pain EXAM: ABDOMEN - 1 VIEW COMPARISON:  CT abdomen and pelvis June 20, 2013 FINDINGS: There is diffuse stool throughout colon. There is no bowel dilatation or air-fluid level to suggest bowel obstruction. No free air. No abnormal calcifications are evident. Lung bases are clear. IMPRESSION: Diffuse stool throughout colon, suggesting constipation. No bowel obstruction or free air. No abnormal calcifications are evident. This degree of stool could obscure small calcifications. Electronically Signed   By: Gene Anderson M.D.   On: 01/26/2018 11:56    ASSESSMENT AND PLAN:  Gene Anderson  was seen today for std testing.  Diagnoses and all orders for this visit:  Left testicular pain his symptoms are mild to best.  I have given him 2 options today.  We could go ahead and refer to urology given history of smoking and hematuria, or we can recheck back in 2 weeks to retest the urine, the CBC and go over his results together.  If the hematuria persists at that time given his history of smoking I will refer to urology. -     POCT urinalysis dipstick -     POCT Microscopic Urinalysis ([facility]) -     POCT CBC -     CBC -  Hemoglobin A1c -     Basic metabolic panel -     PSA -     Urine Culture -     GC/Chlamydia Probe Amp -     DG Abd 1 View; Future -     Care order/instruction:    The patient is advised to call or return to clinic if he does not see an improvement in symptoms, or to seek the care of the closest emergency department if he worsens with the above plan.   [name], MHS, PA-C [facility] 01/26/2018 12:04 PM

## 2018-01-26 NOTE — Patient Instructions (Addendum)
  Please come back in 2 weeks so we can recheck the blood in your urine.  If you have any pain is okay to take 600 mg of ibuprofen every 6-8 hours as needed.  Always eat when you take ibuprofen.   IF you received an x-ray today, you will receive an invoice from [facility]. Please contact [facility] at [phone] with questions or concerns regarding your invoice.   IF you received labwork today, you will receive an invoice from [facility]. Please contact [facility] at [phone] with questions or concerns regarding your invoice.   Our billing staff will not be able to assist you with questions regarding bills from these companies.  You will be contacted with the lab results as soon as they are available. The fastest way to get your results is to activate your My Chart account. Instructions are located on the last page of this paperwork. If you have not heard from [place] regarding the results in 2 weeks, please contact this office.

## 2018-01-27 LAB — BASIC METABOLIC PANEL
BUN / CREAT RATIO: 13 (ref 9–20)
BUN: 11 mg/dL (ref 6–24)
CHLORIDE: 104 mmol/L (ref 96–106)
CO2: 20 mmol/L (ref 20–29)
Calcium: 9.4 mg/dL (ref 8.7–10.2)
Creatinine, Ser: 0.84 mg/dL (ref 0.76–1.27)
GFR calc Af Amer: 114 mL/min/{1.73_m2} (ref >59)
GFR, EST NON AFRICAN AMERICAN: 99 mL/min/{1.73_m2} (ref >59)
Glucose: 92 mg/dL (ref 65–99)
Potassium: 4.3 mmol/L (ref 3.5–5.2)
Sodium: 140 mmol/L (ref 134–144)

## 2018-01-27 LAB — CBC
HEMATOCRIT: 43.5 % (ref 37.5–51.0)
Hemoglobin: 14.9 g/dL (ref 13.0–17.7)
MCH: 31.5 pg (ref 26.6–33.0)
MCHC: 34.3 g/dL (ref 31.5–35.7)
MCV: 92 fL (ref 79–97)
Platelets: 251 10*3/uL (ref 150–379)
RBC: 4.73 x10E6/uL (ref 4.14–5.80)
RDW: 13.4 % (ref 12.3–15.4)
WBC: 3.5 10*3/uL (ref 3.4–10.8)

## 2018-01-27 LAB — HEMOGLOBIN A1C
ESTIMATED AVERAGE GLUCOSE: 117 mg/dL
HEMOGLOBIN A1C: 5.7 % — AB (ref 4.8–5.6)

## 2018-01-27 LAB — URINE CULTURE: Organism ID, Bacteria: NO GROWTH

## 2018-01-27 LAB — PSA: Prostate Specific Ag, Serum: 0.6 ng/mL (ref 0.0–4.0)

## 2018-01-28 LAB — GC/CHLAMYDIA PROBE AMP
CHLAMYDIA, DNA PROBE: NEGATIVE
Neisseria gonorrhoeae by PCR: NEGATIVE

## 2018-02-03 ENCOUNTER — Ambulatory Visit (INDEPENDENT_AMBULATORY_CARE_PROVIDER_SITE_OTHER): Payer: 59

## 2018-02-03 ENCOUNTER — Encounter (HOSPITAL_COMMUNITY): Payer: Self-pay | Admitting: Emergency Medicine

## 2018-02-03 ENCOUNTER — Ambulatory Visit (HOSPITAL_COMMUNITY)
Admission: EM | Admit: 2018-02-03 | Discharge: 2018-02-03 | Disposition: A | Discharge status: Home / Self Care | Payer: 59 | Attending: Family Medicine | Admitting: Family Medicine

## 2018-02-03 DIAGNOSIS — S61012A Laceration without foreign body of left thumb without damage to nail, initial encounter: Secondary | ICD-10-CM | POA: Diagnosis not present

## 2018-02-03 DIAGNOSIS — Z23 Encounter for immunization: Secondary | ICD-10-CM | POA: Diagnosis not present

## 2018-02-03 DIAGNOSIS — R030 Elevated blood-pressure reading, without diagnosis of hypertension: Secondary | ICD-10-CM | POA: Diagnosis not present

## 2018-02-03 DIAGNOSIS — W268XXA Contact with other sharp object(s), not elsewhere classified, initial encounter: Secondary | ICD-10-CM | POA: Diagnosis not present

## 2018-02-03 MED ORDER — TETANUS-DIPHTH-ACELL PERTUSSIS 5-2.5-18.5 LF-MCG/0.5 IM SUSP
INTRAMUSCULAR | Status: AC
  Filled 2018-02-03: qty 0.5

## 2018-02-03 MED ORDER — TETANUS-DIPHTH-ACELL PERTUSSIS 5-2.5-18.5 LF-MCG/0.5 IM SUSP
0.5000 mL | Freq: Once | INTRAMUSCULAR | Status: AC
  Administered 2018-02-03: 0.5 mL via INTRAMUSCULAR

## 2018-02-03 NOTE — ED Provider Notes (Signed)
[facility]    CSN: [id number] Arrival date & time: 02/03/18  1239     History   Chief Complaint Chief Complaint  Patient presents with  . Finger Injury    HPI Gene Anderson is a 57 y.o. male.   Complains of laceration to left thumb that began today.  It occurred after the blade broke on the saw he was using and he cut his left thumb.  Bleeding controlled.    Last tetanus shot >5 years ago.  Requests      Past Medical History:  Diagnosis Date  . Atrial fibrillation ([place])   . Diverticulosis   . Fatty liver 06/20/13  . Hyperplastic colon polyp   . Internal hemorrhoids   . Leukopenia   . Neck pain   . Prostate enlargement   . Spinal stenosis     Patient Active Problem List   Diagnosis Date Noted  . 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 Atrial fibrillation ([place]) 04/18/2014  . Paresthesia 02/16/2014  . Neck pain 02/16/2014  . Leukopenia 02/16/2013    Past Surgical History:  Procedure Laterality Date  . CARDIOVERSION  03/2014  . Flexible cystoscopy and repair of torn tunica albuginea    . ULNAR NERVE TRANSPOSITION Left 08/26/2017   Procedure: LEFT ELBOW ULNAR NEUROLYSIS;  Surgeon: Jessy Oto, MD;  Location: [facility];  Service: Orthopedics;  Laterality: Left;       Home Medications    Prior to Admission medications   Medication Sig Start Date End Date Taking? Authorizing Provider  celecoxib (CELEBREX) 200 MG capsule Take 1 capsule (200 mg total) by mouth 2 (two) times daily. 01/12/18   Jessy Oto, MD  flecainide (TAMBOCOR) 100 MG tablet Take 1 tablet (100 mg total) by mouth daily as needed. for palpitations Patient not taking: Reported on 01/26/2018 04/04/16   Evans Lance, MD  gabapentin (NEURONTIN) 300 MG capsule Take at night time for 3 days then at night and in the AM for 3 days  And then TID. 12/09/17   Jessy Oto, MD  hydrOXYzine (ATARAX/VISTARIL) 25 MG tablet Take 0.5-1 tablets (12.5-25  mg total) by mouth every 8 (eight) hours as needed for itching. Patient not taking: Reported on 01/26/2018 10/05/17   [name], PA-C  levocetirizine (XYZAL) 5 MG tablet Take 1 tablet (5 mg total) by mouth every evening. Patient not taking: Reported on 01/26/2018 10/05/17   [name], PA-C  metoprolol (LOPRESSOR) 50 MG tablet Take 1 tablet (50 mg total) by mouth daily as needed. for palpitations 04/04/16   Evans Lance, MD  naphazoline-pheniramine (NAPHCON-A) 0.025-0.3 % ophthalmic solution Place 1 drop into both eyes 2 (two) times daily as needed for irritation or allergies (USES IN THE MORNING).     [provider]    Family History Family History  Problem Relation Age of Onset  . Cancer Father   . Stroke Maternal Grandfather   . Cancer Paternal Grandmother     Social History Social History   Tobacco Use  . Smoking status: Current Every Day Smoker    Packs/day: 1.00    Years: 36.00    Pack years: 36.00    Types: Cigarettes  . Smokeless tobacco: Never Used  Substance Use Topics  . Alcohol use: Yes    Comment: social  . Drug use: No     Allergies   Augmentin [amoxicillin-pot clavulanate]   Review of  Systems Review of Systems  Constitutional: Negative for chills and fever.  Respiratory: Negative for shortness of breath.   Cardiovascular: Negative for chest pain.  Gastrointestinal: Negative for nausea and vomiting.  Skin: Positive for wound.     Physical Exam Triage Vital Signs ED Triage Vitals [02/03/18 1309]  Enc Vitals Group     BP (!) 164/100     Pulse Rate 72     Resp 18     Temp 98.3 F (36.8 C)     Temp Source Oral     SpO2 100 %     Weight      Height      Head Circumference      Peak Flow      Pain Score      Pain Loc      Pain Edu?      Excl. in [place]?    No data found.  Updated Vital Signs BP (!) 154/100   Pulse 71   Temp 98.3 F (36.8 C) (Oral)   Resp 18   SpO2 100%   Physical Exam  Constitutional: He is oriented  to person, place, and time. He appears well-developed and well-nourished. No distress.  HENT:  Head: Normocephalic and atraumatic.  Right Ear: External ear normal.  Left Ear: External ear normal.  Nose: Nose normal.  Neck: Normal range of motion.  Cardiovascular: Normal rate, regular rhythm and normal heart sounds. Exam reveals no friction rub.  No murmur heard. Radial pulse 2+ bilaterally    Pulmonary/Chest: Effort normal and breath sounds normal. No stridor. No respiratory distress. He has no wheezes. He has no rales.  Musculoskeletal:  Left hand Inspection: Approximately 2 cm laceration to dorsal aspect of left thumb superior to IP joint.  Bleeding controlled Sensation intact Cap refill <2 seconds Strength intact FROM of thumb without limitation or difficulty  Neurological: He is alert and oriented to person, place, and time.  Skin: Skin is warm and dry. Capillary refill takes less than 2 seconds. He is not diaphoretic.  Psychiatric: He has a normal mood and affect. His behavior is normal. Judgment and thought content normal.  Nursing note and vitals reviewed.    UC Treatments / Results  Labs (all labs ordered are listed, but only abnormal results are displayed) Labs Reviewed - No data to display  EKG None  Radiology Dg Finger Thumb Left  Result Date: 02/03/2018 CLINICAL DATA:  57 year old male with left thumb laceration from saw today. EXAM: LEFT THUMB 2+V COMPARISON:  None. FINDINGS: Soft tissue injury with nonspecific punctate radiodensities along the dorsal aspect of the distal metadiaphysis of the left thumb proximal phalanx. The underlying cortex of the left thumb proximal phalanx appears intact. The left IP joint appears intact with degenerative spurring due to osteoarthritis. Similar findings at the left thumb MCP joint. No acute osseous abnormality identified. IMPRESSION: Soft tissue injury along the dorsal left thumb proximal phalanx with nonspecific punctate  radiodensities in the wound, but no osseous injury identified. Electronically Signed   By: Genevie Ann M.D.   On: 02/03/2018 13:54   X-rays negative for bony abnormalities including fracture, or dislocation.  No soft tissue swelling.    I have reviewed the x-rays myself and the radiologist interpretation. I am in agreement with the radiologist interpretation.    Procedures Laceration Repair Date/Time: 02/03/2018 2:50 PM Performed by: Stacey Drain [place], PA-C Authorized by: Vanessa Kick, MD   Consent:    Consent obtained:  Verbal   Consent given  by:  Patient   Risks discussed:  Infection and pain Anesthesia (see MAR for exact dosages):    Anesthesia method:  Local infiltration   Local anesthetic:  Lidocaine 2% w/o epi Laceration details:    Location:  Finger   Finger location:  L thumb   Length (cm):  2 Pre-procedure details:    Preparation:  Imaging obtained to evaluate for foreign bodies Exploration:    Hemostasis achieved with:  Direct pressure   Wound exploration: wound explored through full range of motion and entire depth of wound probed and visualized     Wound extent: no foreign bodies/material noted, no tendon damage noted and no underlying fracture noted   Treatment:    Area cleansed with:  Saline and Betadine   Irrigation solution:  Sterile saline and tap water   Visualized foreign bodies/material removed: no   Skin repair:    Repair method:  Sutures (4)   Suture size:  4-0   Suture technique:  Simple interrupted Approximation:    Approximation:  Close Post-procedure details:    Patient tolerance of procedure:  Tolerated well, no immediate complications   (including critical care time)  Medications Ordered in UC Medications  Tdap (BOOSTRIX) injection 0.5 mL (0.5 mLs Intramuscular Given 02/03/18 1526)    Initial Impression / Assessment and Plan / UC Course  I have reviewed the triage vital signs and the nursing notes.  Pertinent labs & imaging results that were  available during my care of the patient were reviewed by me and considered in my medical decision making (see chart for details).     Left thumb laceration.  Verbal consent obtained. Wound explored.  Tendon intact. Xrays negative for fracture or FBs.  Wound irrigated with tap water and then NS solution.  Site saturated with betadine.  Anesthestized with lidocaine 2% without epi around laceration. Four 4.0 prolene sutures applied.  Patient tolerated procedure well without complication.  Antibiotic ointment and bandage applied.  Tetanus out of date.  Booster given in office.  Will follow up in 7 days to have sutures removed.  Return and ER precautions given.   Final Clinical Impressions(s) / UC Diagnoses   Final diagnoses:  Laceration of left thumb without foreign body without damage to nail, initial encounter  Elevated blood pressure reading     Discharge Instructions     Wash with warm water and soap Do not submerge in water Return in 7 days to have sutures removed Return sooner or go to ER if you have any new or worsening symptoms    ED Prescriptions    None     Controlled Substance Prescriptions [place] Controlled Substance Registry consulted? Not Applicable   Lestine Box, [place] 02/03/18 1546

## 2018-02-03 NOTE — Discharge Instructions (Addendum)
Wash with warm water and soap Do not submerge in water Return in 7 days to have sutures removed Return sooner or go to ER if you have any new or worsening symptoms

## 2018-02-03 NOTE — ED Triage Notes (Signed)
Pt here with laceration to left thumb from saw blade; bleeding controlled

## 2018-02-09 ENCOUNTER — Ambulatory Visit (INDEPENDENT_AMBULATORY_CARE_PROVIDER_SITE_OTHER): Payer: 59 | Admitting: Physician Assistant

## 2018-02-09 ENCOUNTER — Other Ambulatory Visit: Payer: Self-pay

## 2018-02-09 ENCOUNTER — Encounter: Payer: Self-pay | Admitting: Physician Assistant

## 2018-02-09 VITALS — BP 128/92 | HR 82 | Temp 98.0°F | Ht 74.0 in | Wt 218.6 lb

## 2018-02-09 DIAGNOSIS — N50812 Left testicular pain: Secondary | ICD-10-CM | POA: Diagnosis not present

## 2018-02-09 LAB — POCT URINALYSIS DIP (MANUAL ENTRY)
BILIRUBIN UA: NEGATIVE
BILIRUBIN UA: NEGATIVE mg/dL
GLUCOSE UA: NEGATIVE mg/dL
NITRITE UA: NEGATIVE
Protein Ur, POC: NEGATIVE mg/dL
Spec Grav, UA: 1.025 (ref 1.010–1.025)
Urobilinogen, UA: 0.2 E.U./dL
pH, UA: 5.5 (ref 5.0–8.0)

## 2018-02-09 LAB — POC MICROSCOPIC URINALYSIS (UMFC): MUCUS RE: ABSENT

## 2018-02-09 MED ORDER — SULFAMETHOXAZOLE-TRIMETHOPRIM 800-160 MG PO TABS: 1.0000 | ORAL_TABLET | Freq: Two times a day (BID) | ORAL | 0 refills | Status: DC

## 2018-02-09 NOTE — Patient Instructions (Signed)
     IF you received an x-ray today, you will receive an invoice from [facility]. Please contact [facility] at [phone] with questions or concerns regarding your invoice.   IF you received labwork today, you will receive an invoice from [facility]. Please contact [facility] at [phone] with questions or concerns regarding your invoice.   Our billing staff will not be able to assist you with questions regarding bills from these companies.  You will be contacted with the lab results as soon as they are available. The fastest way to get your results is to activate your My Chart account. Instructions are located on the last page of this paperwork. If you have not heard from us regarding the results in 2 weeks, please contact this office.     

## 2018-02-09 NOTE — Progress Notes (Signed)
02/09/2018 10:20 AM   DOB: [birth year] / MRN: [id number]  SUBJECTIVE:  Gene Anderson is a 57 y.o. male presenting for recheck testicular pain and hematuria. Patient reports a 70% imporvement in symptoms today with watchful waiting.  Continues to deny constitutional symptoms, dysuria, urgency, frequency.   He is allergic to augmentin [amoxicillin-pot clavulanate].   He  has a past medical history of Atrial fibrillation ([place]), Diverticulosis, Fatty liver (06/20/13), Hyperplastic colon polyp, Internal hemorrhoids, Leukopenia, Neck pain, Prostate enlargement, and Spinal stenosis.    He  reports that he has been smoking cigarettes.  He has a 36.00 pack-year smoking history. He has never used smokeless tobacco. He reports that he drinks alcohol. He reports that he does not use drugs. He  has no sexual activity history on file. The patient  has a past surgical history that includes Flexible cystoscopy and repair of torn tunica albuginea; Cardioversion (03/2014); and Ulnar nerve transposition (Left, 08/26/2017).  His family history includes Cancer in his father and paternal grandmother; Stroke in his maternal grandfather.  Review of Systems  Constitutional: Negative for chills, diaphoresis and fever.  Gastrointestinal: Negative for nausea.  Genitourinary: Negative for dysuria, flank pain, frequency, hematuria and urgency.  Musculoskeletal: Negative for back pain and myalgias.  Skin: Negative for rash.  Neurological: Negative for dizziness.    The problem list and medications were reviewed and updated by myself where necessary and exist elsewhere in the encounter.   OBJECTIVE:  BP (!) 128/92 (BP Location: Right Arm, Patient Position: Sitting, Cuff Size: Large)   Pulse 82   Temp 98 F (36.7 C) (Oral)   Ht 6\' 2"  (1.88 m)   Wt 218 lb 9.6 oz (99.2 kg)   SpO2 99%   BMI 28.07 kg/m   Physical Exam  Constitutional: He is oriented to person, place, and time. He appears well-developed. He does  not appear ill.  Eyes: Pupils are equal, round, and reactive to light. Conjunctivae and EOM are normal.  Cardiovascular: Normal rate.  Pulmonary/Chest: Effort normal.  Abdominal: He exhibits no distension. Hernia confirmed negative in the right inguinal area and confirmed negative in the left inguinal area.  Genitourinary: Testes normal and penis normal. Rectal exam shows guaiac negative stool. Right testis shows no mass, no swelling and no tenderness. Right testis is descended. Cremasteric reflex is not absent on the right side. Left testis shows no mass, no swelling and no tenderness. Left testis is descended. Cremasteric reflex is not absent on the left side. Uncircumcised. No phimosis, paraphimosis, hypospadias, penile erythema or penile tenderness. No discharge found.  Musculoskeletal: Normal range of motion.  Lymphadenopathy: No inguinal adenopathy noted on the right or left side.  Neurological: He is alert and oriented to person, place, and time. No cranial nerve deficit. Coordination normal.  Skin: Skin is warm and dry. He is not diaphoretic.  Psychiatric: He has a normal mood and affect.  Nursing note and vitals reviewed.   Results for orders placed or performed in visit on 02/09/18 (from the past 72 hour(s))  POCT urinalysis dipstick     Status: Abnormal   Collection Time: 02/09/18  9:57 AM  Result Value Ref Range   Color, UA yellow yellow   Clarity, UA hazy (A) clear   Glucose, UA negative negative mg/dL   Bilirubin, UA negative negative   Ketones, POC UA negative negative mg/dL   Spec Grav, UA 1.025 1.010 - 1.025   Blood, UA small (A) negative   pH, UA 5.5  5.0 - 8.0   Protein Ur, POC negative negative mg/dL   Urobilinogen, UA 0.2 0.2 or 1.0 E.U./dL   Nitrite, UA Negative Negative   Leukocytes, UA Small (1+) (A) Negative  POCT Microscopic Urinalysis ([facility])     Status: Abnormal   Collection Time: 02/09/18 10:12 AM  Result Value Ref Range   WBC,UR,HPF,POC Many (A) None  WBC/hpf   RBC,UR,HPF,POC Few (A) None RBC/hpf   Bacteria None None, Too numerous to count   Mucus Absent Absent   Epithelial Cells, UR Per Microscopy Few (A) None, Too numerous to count cells/hpf    No results found.  ASSESSMENT AND PLAN:  Gene Anderson was seen today for follow-up.  Diagnoses and all orders for this visit:  Left testicular pain: Improved with watchful waiting. Hematuria has persisted and now he has leuks. Will culture and cover for E. Coli.  GC/Chlamydia negative a no new partners. Avoiding CIPRO purposefully given history of smoking.  -     POCT urinalysis dipstick    The patient is advised to call or return to clinic if he does not see an improvement in symptoms, or to seek the care of the closest emergency department if he worsens with the above plan.   [name], MHS, PA-C [facility] 02/09/2018 10:20 AM

## 2018-02-09 NOTE — Addendum Note (Signed)
Addended by: [name] on: 02/09/2018 10:39 AM   Modules accepted: Orders

## 2018-02-10 LAB — PSA: PROSTATE SPECIFIC AG, SERUM: 0.6 ng/mL (ref 0.0–4.0)

## 2018-02-10 LAB — GC/CHLAMYDIA PROBE AMP
CHLAMYDIA, DNA PROBE: NEGATIVE
NEISSERIA GONORRHOEAE BY PCR: NEGATIVE

## 2018-02-10 LAB — URINE CULTURE: ORGANISM ID, BACTERIA: NO GROWTH

## 2018-02-11 ENCOUNTER — Ambulatory Visit (INDEPENDENT_AMBULATORY_CARE_PROVIDER_SITE_OTHER): Payer: 59 | Admitting: Physical Medicine and Rehabilitation

## 2018-02-11 ENCOUNTER — Encounter (INDEPENDENT_AMBULATORY_CARE_PROVIDER_SITE_OTHER): Payer: Self-pay | Admitting: Physical Medicine and Rehabilitation

## 2018-02-11 DIAGNOSIS — R202 Paresthesia of skin: Secondary | ICD-10-CM

## 2018-02-11 NOTE — Progress Notes (Signed)
   Numeric Pain Rating Scale and Functional Assessment Average Pain 6   In the last MONTH (on 0-10 scale) has pain interfered with the following?  1. General activity like being  able to carry out your everyday physical activities such as walking, climbing stairs, carrying groceries, or moving a chair?  Rating(3) 

## 2018-02-16 ENCOUNTER — Encounter (INDEPENDENT_AMBULATORY_CARE_PROVIDER_SITE_OTHER): Payer: Self-pay | Admitting: Specialist

## 2018-02-16 ENCOUNTER — Ambulatory Visit (INDEPENDENT_AMBULATORY_CARE_PROVIDER_SITE_OTHER): Payer: 59 | Admitting: Specialist

## 2018-02-16 VITALS — BP 141/85 | HR 82 | Ht 73.0 in | Wt 217.0 lb

## 2018-02-16 DIAGNOSIS — G5622 Lesion of ulnar nerve, left upper limb: Secondary | ICD-10-CM

## 2018-02-16 NOTE — Procedures (Signed)
EMG & NCV Findings: Evaluation of the left ulnar motor nerve showed decreased conduction velocity (B Elbow-Wrist, 49 m/s) and decreased conduction velocity (A Elbow-B Elbow, 35 m/s).  The left median (across palm) sensory nerve showed no response (Palm) and prolonged distal peak latency (4.4 ms).  The left ulnar sensory nerve showed prolonged distal peak latency (5.0 ms), reduced amplitude (6.7 V), and decreased conduction velocity (Wrist-5th Digit, 28 m/s).  All remaining nerves (as indicated in the following tables) were within normal limits.    Needle evaluation of the left first dorsal interosseous muscle showed increased insertional activity, widespread spontaneous activity, and diminished recruitment.  The left flexor digitorum profundus muscle showed increased insertional activity and slightly increased spontaneous activity.  All remaining muscles (as indicated in the following table) showed no evidence of electrical instability.    Impression: The above electrodiagnostic study is ABNORMAL and reveals evidence of:  1.  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  This does represent some improvement in terms of nerve conductions and amount of denervation seen.  He does however have atrophy in the prior study did show severe nerve compression.  He may indeed have some permanent sensation changes but would likely get some return of strength to do to sprouting of motor units.  2. 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  This is new from prior study.   There is no significant electrodiagnostic evidence of any other focal nerve entrapment, brachial plexopathy or cervical radiculopathy.   Recommendations: 1.  Follow-up with referring physician. 2.  Continue current management of symptoms. 3.  Repeat studies in 3-4 months if symptoms are progressive or not  resolved.   Nerve Conduction Studies Anti Sensory Summary Table   Stim Site NR Peak (ms) Norm Peak (ms) P-T Amp (V) Norm P-T Amp Site1 Site2 Delta-P (ms) Dist (cm) Vel (m/s) Norm Vel (m/s)  Left Median Acr Palm Anti Sensory (2nd Digit)  31C  Wrist    *4.4 <3.6 24.2 >10 Wrist Palm  0.0    Palm *NR  <2.0          Left Radial Anti Sensory (Base 1st Digit)  31.3C  Wrist    2.2 <3.1 18.1  Wrist Base 1st Digit 2.2 0.0    Left Ulnar Anti Sensory (5th Digit)  31.2C  Wrist    *5.0 <3.7 *6.7 >15.0 Wrist 5th Digit 5.0 14.0 *28 >38   Motor Summary Table   Stim Site NR Onset (ms) Norm Onset (ms) O-P Amp (mV) Norm O-P Amp Site1 Site2 Delta-0 (ms) Dist (cm) Vel (m/s) Norm Vel (m/s)  Left Median Motor (Abd Poll Brev)  32C  Wrist    3.8 <4.2 12.7 >5 Elbow Wrist 4.9 25.0 51 >50  Elbow    8.7  11.8         Left Ulnar Motor (Abd Dig Min)  32.1C  Wrist    3.8 <4.2 5.7 >3 B Elbow Wrist 5.3 26.0 *49 >53  B Elbow    9.1  5.6  A Elbow B Elbow 2.7 9.5 *35 >53  A Elbow    11.8  5.8          EMG   Side Muscle Nerve Root Ins Act Fibs Psw Amp Dur Poly Recrt Int Fraser Din Comment  Left Abd Poll Brev Median C8-T1 Nml Nml Nml Nml Nml 0 Nml Nml   Left 1stDorInt Ulnar C8-T1 *Incr *  4+ *4+ Nml Nml 0 *Reduced Nml   Left PronatorTeres Median C6-7 Nml Nml Nml Nml Nml 0 Nml Nml   Left Biceps Musculocut C5-6 Nml Nml Nml Nml Nml 0 Nml Nml   Left Deltoid Axillary C5-6 Nml Nml Nml Nml Nml 0 Nml Nml   Left FlexDigProf Ulnar C8,T1 *Incr *1+ *1+ Nml Nml 0 Nml Nml     Nerve Conduction Studies Anti Sensory Left/Right Comparison   Stim Site L Lat (ms) R Lat (ms) L-R Lat (ms) L Amp (V) R Amp (V) L-R Amp (%) Site1 Site2 L Vel (m/s) R Vel (m/s) L-R Vel (m/s)  Median Acr Palm Anti Sensory (2nd Digit)  31C  Wrist *4.4   24.2   Wrist Palm     Palm             Radial Anti Sensory (Base 1st Digit)  31.3C  Wrist 2.2   18.1   Wrist Base 1st Digit     Ulnar Anti Sensory (5th Digit)  31.2C  Wrist *5.0   *6.7   Wrist 5th Digit  *28     Motor Left/Right Comparison   Stim Site L Lat (ms) R Lat (ms) L-R Lat (ms) L Amp (mV) R Amp (mV) L-R Amp (%) Site1 Site2 L Vel (m/s) R Vel (m/s) L-R Vel (m/s)  Median Motor (Abd Poll Brev)  32C  Wrist 3.8   12.7   Elbow Wrist 51    Elbow 8.7   11.8         Ulnar Motor (Abd Dig Min)  32.1C  Wrist 3.8   5.7   B Elbow Wrist *49    B Elbow 9.1   5.6   A Elbow B Elbow *35    A Elbow 11.8   5.8            Waveforms:

## 2018-02-16 NOTE — Progress Notes (Signed)
Office Visit Note   Patient: Gene Anderson           Date of Birth: 1961-09-25           MRN: [id number] Visit Date: 02/16/2018              Requested by: Darlyne Russian, [address] [place], [place] [address] PCP: [facility]   Assessment & Plan: Visit Diagnoses:  1. Ulnar neuropathy at elbow, left     Plan:Nerve regeneration is 1 mm per day or a yard or meter over 1000 days. The length on your are that the nerve must regenerate is 460 mm or more that one year 351 days.  The nerve requires vitamin B12, B6 and folate and creel oil to regenerate.  The vitamin B is available as a B complex tablet.   Follow-Up Instructions: Return in about 3 months (around 05/19/2018).   Orders:  Orders Placed This Encounter  Procedures  . Ambulatory referral to Occupational Therapy   No orders of the defined types were placed in this encounter.     Procedures: No procedures performed   Clinical Data: No additional findings.   Subjective: Chief Complaint  Patient presents with  . Left Arm - Follow-up    EMG Review    57 year old right handed male now 6 months post left ulnar neurolysis for severe left cubital tunnel. He has persistent left arm numbness and tingling in the left arm and hand. There is persistent weakness in left hand grip strength.    Review of Systems  Constitutional: Negative.   HENT: Negative.   Eyes: Negative.   Respiratory: Negative.   Cardiovascular: Negative.   Gastrointestinal: Negative.   Endocrine: Negative.   Genitourinary: Negative.   Musculoskeletal: Negative.   Skin: Negative.   Allergic/Immunologic: Negative.   Neurological: Negative.   Hematological: Negative.   Psychiatric/Behavioral: Negative.      Objective: Vital Signs: BP (!) 141/85 (BP Location: Left Arm, Patient Position: Sitting)   Pulse 82   Ht 6\' 1"  (1.854 m)   Wt 217 lb (98.4 kg)   BMI 28.63 kg/m   Physical Exam  Constitutional: He is oriented  to person, place, and time. He appears well-developed and well-nourished.  HENT:  Head: Normocephalic and atraumatic.  Eyes: Pupils are equal, round, and reactive to light. EOM are normal.  Neck: Normal range of motion. Neck supple.  Pulmonary/Chest: Effort normal and breath sounds normal.  Abdominal: Soft. Bowel sounds are normal.  Musculoskeletal: Normal range of motion.  Neurological: He is alert and oriented to person, place, and time.  Skin: Skin is warm and dry.  Psychiatric: He has a normal mood and affect. His behavior is normal. Judgment and thought content normal.    Left Hand Exam   Tenderness  The patient is experiencing tenderness in the ulnar area.   Range of Motion  Wrist  Extension: normal  Flexion: normal  Pronation: normal  Supination: normal   Muscle Strength  Wrist extension: 4/5  Wrist flexion: 5/5  Grip:  4/5   Tests  Phalen's Sign: negative Tinel's sign (median nerve): positive Finkelstein's test: negative  Other  Erythema: absent Scars: present Sensation: normal Pulse: present  Comments:  Hyperflexion test left elbow is negative.       Specialty Comments:  No specialty comments available.  Imaging: No results found.   PMFS History: Patient Active Problem List   Diagnosis Date Noted  .  Cubital tunnel syndrome on left 08/26/2017    Priority: High    Class: Chronic  . Atrial fibrillation ([place]) 04/18/2014  . Paresthesia 02/16/2014  . Neck pain 02/16/2014  . Leukopenia 02/16/2013   Past Medical History:  Diagnosis Date  . Atrial fibrillation ([place])   . Diverticulosis   . Fatty liver 06/20/13  . Hyperplastic colon polyp   . Internal hemorrhoids   . Leukopenia   . Neck pain   . Prostate enlargement   . Spinal stenosis     Family History  Problem Relation Age of Onset  . Cancer Father   . Stroke Maternal Grandfather   . Cancer Paternal Grandmother     Past Surgical History:  Procedure Laterality Date  . CARDIOVERSION   03/2014  . Flexible cystoscopy and repair of torn tunica albuginea    . ULNAR NERVE TRANSPOSITION Left 08/26/2017   Procedure: LEFT ELBOW ULNAR NEUROLYSIS;  Surgeon: Jessy Oto, MD;  Location: [facility];  Service: Orthopedics;  Laterality: Left;   Social History   Occupational History    Employer: [employer]  Tobacco Use  . Smoking status: Current Every Day Smoker    Packs/day: 1.00    Years: 36.00    Pack years: 36.00    Types: Cigarettes  . Smokeless tobacco: Never Used  Substance and Sexual Activity  . Alcohol use: Yes    Comment: social  . Drug use: No  . Sexual activity: Not on file

## 2018-02-16 NOTE — Progress Notes (Signed)
BORDEN THUNE - 57 y.o. male MRN [id number]  Date of birth: 22-Jul-1961  Office Visit Note: Visit Date: 02/11/2018 PCP: [facility] Referred by: Darlyne Russian, MD  Subjective: Chief Complaint  Patient presents with  . Left Hand - Numbness, Tingling  . Left Elbow - Tingling, Numbness  . Left Forearm - Tingling, Numbness   HPI: Who comes in today at the request of Dr. Louanne Skye for electrodiagnostic study of the left upper limb.  Patient had prior electrodiagnostic study in October 2018 that showed severe ulnar neuropathy at or about the elbow.  At that time there were denervation changes in the muscles of the ulnar nerve as well as significant slowing of the ulnar nerve across the elbow.  There was no concomitant other nerve injury or entrapment.  He went on to have ulnar nerve transposition performed in November 2018.  He reports that the symptoms of really not gotten much better they are always there.  He feels like it is more numbness and tingling than pain but he does rate his average pain is a 6.  He does feel like running water in the shower makes it feel better.  This does go from tingling and numbness in the left elbow into the fifth digit predominantly.  He does not endorse any tingling or numbness in the other digits.  Are listed below.   ROS Otherwise per HPI.  Assessment & Plan: Visit Diagnoses:  1. Paresthesia of skin     Plan: No additional findings.  Impression: The above electrodiagnostic study is ABNORMAL and reveals evidence of:  1.  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  This does represent some improvement in terms of nerve conductions and amount of denervation seen.  He does however have atrophy in the prior study did show severe nerve compression.  He may indeed have some permanent sensation changes but would likely get some return of strength to do to sprouting of motor units.  2.  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  This is new from prior study.   There is no significant electrodiagnostic evidence of any other focal nerve entrapment, brachial plexopathy or cervical radiculopathy.   Recommendations: 1.  Follow-up with referring physician. 2.  Continue current management of symptoms. 3.  Repeat studies in 3-4 months if symptoms are progressive or not resolved.   Meds & Orders: No orders of the defined types were placed in this encounter.   Orders Placed This Encounter  Procedures  . NCV with EMG (electromyography)    Follow-up: Return for Dr. [name].   Procedures: No procedures performed  EMG & NCV Findings: Evaluation of the left ulnar motor nerve showed decreased conduction velocity (B Elbow-Wrist, 49 m/s) and decreased conduction velocity (A Elbow-B Elbow, 35 m/s).  The left median (across palm) sensory nerve showed no response (Palm) and prolonged distal peak latency (4.4 ms).  The left ulnar sensory nerve showed prolonged distal peak latency (5.0 ms), reduced amplitude (6.7 V), and decreased conduction velocity (Wrist-5th Digit, 28 m/s).  All remaining nerves (as indicated in the following tables) were within normal limits.    Needle evaluation of the left first dorsal interosseous muscle showed increased insertional activity, widespread spontaneous activity, and diminished recruitment.  The left flexor digitorum profundus muscle showed increased insertional activity and slightly increased spontaneous activity.  All remaining muscles (as indicated in the following  table) showed no evidence of electrical instability.    Impression: The above electrodiagnostic study is ABNORMAL and reveals evidence of:  1.  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  This does represent some improvement in terms of nerve conductions and amount  of denervation seen.  He does however have atrophy in the prior study did show severe nerve compression.  He may indeed have some permanent sensation changes but would likely get some return of strength to do to sprouting of motor units.  2. 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  This is new from prior study.   There is no significant electrodiagnostic evidence of any other focal nerve entrapment, brachial plexopathy or cervical radiculopathy.   Recommendations: 1.  Follow-up with referring physician. 2.  Continue current management of symptoms. 3.  Repeat studies in 3-4 months if symptoms are progressive or not resolved.   Nerve Conduction Studies Anti Sensory Summary Table   Stim Site NR Peak (ms) Norm Peak (ms) P-T Amp (V) Norm P-T Amp Site1 Site2 Delta-P (ms) Dist (cm) Vel (m/s) Norm Vel (m/s)  Left Median Acr Palm Anti Sensory (2nd Digit)  31C  Wrist    *4.4 <3.6 24.2 >10 Wrist Palm  0.0    Palm *NR  <2.0          Left Radial Anti Sensory (Base 1st Digit)  31.3C  Wrist    2.2 <3.1 18.1  Wrist Base 1st Digit 2.2 0.0    Left Ulnar Anti Sensory (5th Digit)  31.2C  Wrist    *5.0 <3.7 *6.7 >15.0 Wrist 5th Digit 5.0 14.0 *28 >38   Motor Summary Table   Stim Site NR Onset (ms) Norm Onset (ms) O-P Amp (mV) Norm O-P Amp Site1 Site2 Delta-0 (ms) Dist (cm) Vel (m/s) Norm Vel (m/s)  Left Median Motor (Abd Poll Brev)  32C  Wrist    3.8 <4.2 12.7 >5 Elbow Wrist 4.9 25.0 51 >50  Elbow    8.7  11.8         Left Ulnar Motor (Abd Dig Min)  32.1C  Wrist    3.8 <4.2 5.7 >3 B Elbow Wrist 5.3 26.0 *49 >53  B Elbow    9.1  5.6  A Elbow B Elbow 2.7 9.5 *35 >53  A Elbow    11.8  5.8          EMG   Side Muscle Nerve Root Ins Act Fibs Psw Amp Dur Poly Recrt Int Fraser Din Comment  Left Abd Poll Brev Median C8-T1 Nml Nml Nml Nml Nml 0 Nml Nml   Left 1stDorInt Ulnar C8-T1 *Incr *4+ *4+ Nml Nml 0 *Reduced Nml   Left PronatorTeres Median C6-7 Nml  Nml Nml Nml Nml 0 Nml Nml   Left Biceps Musculocut C5-6 Nml Nml Nml Nml Nml 0 Nml Nml   Left Deltoid Axillary C5-6 Nml Nml Nml Nml Nml 0 Nml Nml   Left FlexDigProf Ulnar C8,T1 *Incr *1+ *1+ Nml Nml 0 Nml Nml     Nerve Conduction Studies Anti Sensory Left/Right Comparison   Stim Site L Lat (ms) R Lat (ms) L-R Lat (ms) L Amp (V) R Amp (V) L-R Amp (%) Site1 Site2 L Vel (m/s) R Vel (m/s) L-R Vel (m/s)  Median Acr Palm Anti Sensory (2nd Digit)  31C  Wrist *4.4   24.2   Wrist [employer]  Radial Anti Sensory (Base 1st Digit)  31.3C  Wrist 2.2   18.1   Wrist Base 1st Digit     Ulnar Anti Sensory (5th Digit)  31.2C  Wrist *5.0   *6.7   Wrist 5th Digit *28     Motor Left/Right Comparison   Stim Site L Lat (ms) R Lat (ms) L-R Lat (ms) L Amp (mV) R Amp (mV) L-R Amp (%) Site1 Site2 L Vel (m/s) R Vel (m/s) L-R Vel (m/s)  Median Motor (Abd Poll Brev)  32C  Wrist 3.8   12.7   Elbow Wrist 51    Elbow 8.7   11.8         Ulnar Motor (Abd Dig Min)  32.1C  Wrist 3.8   5.7   B Elbow Wrist *49    B Elbow 9.1   5.6   A Elbow B Elbow *35    A Elbow 11.8   5.8            Waveforms:            Clinical History: EMG/NCS 06/2017  Impression: The above electrodiagnostic study is ABNORMAL and reveals evidence of a severe left ulnar nerve entrapment at the elbow cubital tunnel syndrome) affecting sensory and motor components. 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   He reports that he has been smoking cigarettes.  He has a 36.00 pack-year smoking history. He has never used smokeless tobacco.  Recent Labs    01/26/18 1202  HGBA1C 5.7*    Objective:  VS:  HT:    WT:   BMI:     BP:   HR: bpm  TEMP: ( )  RESP:  Physical Exam  Musculoskeletal:  Inspection reveals feet of the left FDI but no atrophy of the bilateral APB, FDI or hand intrinsics. There is no swelling, color changes, allodynia or dystrophic changes. There  is 5 out of 5 strength in the bilateral wrist extension and long finger flexion.  Finger abduction.  There is a positive Froment's test on the left.  Decreased sensation in the left ulnar nerve distribution.      Ortho Exam Imaging: No results found.  Past Medical/Family/Surgical/Social History: Medications & Allergies reviewed per EMR, new medications updated. Patient Active Problem List   Diagnosis Date Noted  . 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 Atrial fibrillation ([place]) 04/18/2014  . Paresthesia 02/16/2014  . Neck pain 02/16/2014  . Leukopenia 02/16/2013   Past Medical History:  Diagnosis Date  . Atrial fibrillation ([place])   . Diverticulosis   . Fatty liver 06/20/13  . Hyperplastic colon polyp   . Internal hemorrhoids   . Leukopenia   . Neck pain   . Prostate enlargement   . Spinal stenosis    Family History  Problem Relation Age of Onset  . Cancer Father   . Stroke Maternal Grandfather   . Cancer Paternal Grandmother    Past Surgical History:  Procedure Laterality Date  . CARDIOVERSION  03/2014  . Flexible cystoscopy and repair of torn tunica albuginea    . ULNAR NERVE TRANSPOSITION Left 08/26/2017   Procedure: LEFT ELBOW ULNAR NEUROLYSIS;  Surgeon: Jessy Oto, MD;  Location: [facility];  Service: Orthopedics;  Laterality: Left;   Social History   Occupational History    Employer: [employer]  Tobacco Use  . Smoking status: Current Every Day Smoker  Packs/day: 1.00    Years: 36.00    Pack years: 36.00    Types: Cigarettes  . Smokeless tobacco: Never Used  Substance and Sexual Activity  . Alcohol use: Yes    Comment: social  . Drug use: No  . Sexual activity: Not on file

## 2018-02-16 NOTE — Patient Instructions (Addendum)
Nerve regeneration is 1 mm per day or a yard or meter over 1000 days. The length on your are that the nerve must regenerate is 460 mm or more that one year 351 days.  The nerve requires vitamin B12, B6 and folate and creel oil to regenerate.  The vitamin B is available as a B complex tablet.  Capsainsin cream can also help

## 2018-04-07 NOTE — Progress Notes (Signed)
Was eventually done. Gene Anderson

## 2018-05-18 ENCOUNTER — Other Ambulatory Visit (INDEPENDENT_AMBULATORY_CARE_PROVIDER_SITE_OTHER): Payer: Self-pay | Admitting: Specialist

## 2018-05-18 NOTE — Telephone Encounter (Signed)
Diclofenac Gel refill request 

## 2018-05-21 ENCOUNTER — Ambulatory Visit (INDEPENDENT_AMBULATORY_CARE_PROVIDER_SITE_OTHER): Payer: 59 | Admitting: Specialist

## 2018-06-05 IMAGING — CT CT HEAD W/O CM
4 series · 16 of 47 positions shown, 18 images · non-contrast
Comparison: Head CT dated 06/02/2010

CLINICAL DATA: 56-year-old male with altered mental status.

EXAM:
CT HEAD WITHOUT CONTRAST
TECHNIQUE: Contiguous axial images were obtained from the base of the skull
through the vertex without intravenous contrast.

[Series 3: head without · axial · non-contrast · 0.46mm/px · z∈[-89,+36]mm · 7 of 35 slices shown, 9 images]
[im 5/35  brain]
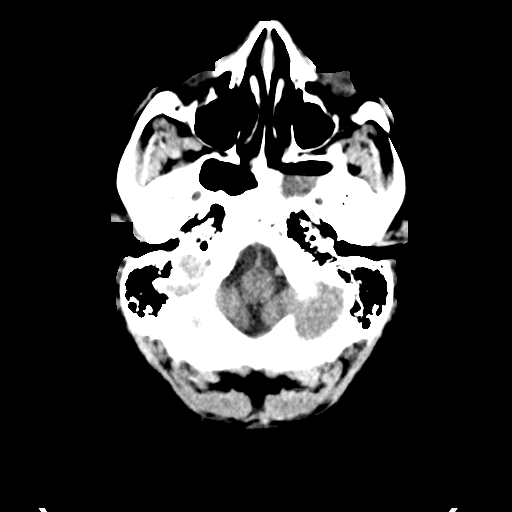
[im 5/35  bone]
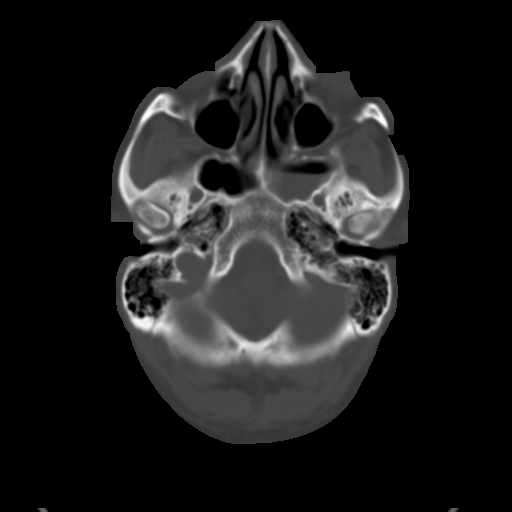
[im 9/35  brain]
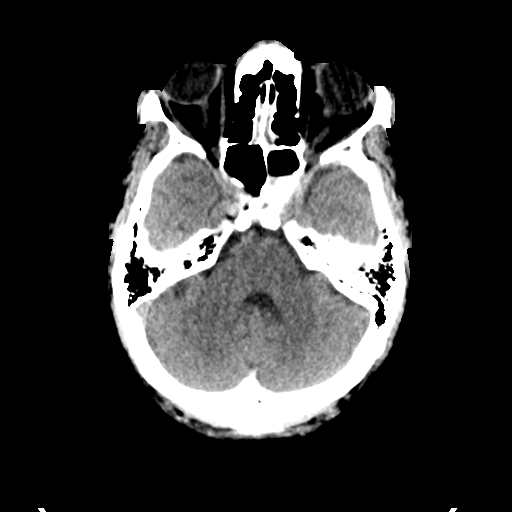
[im 13/35  brain]
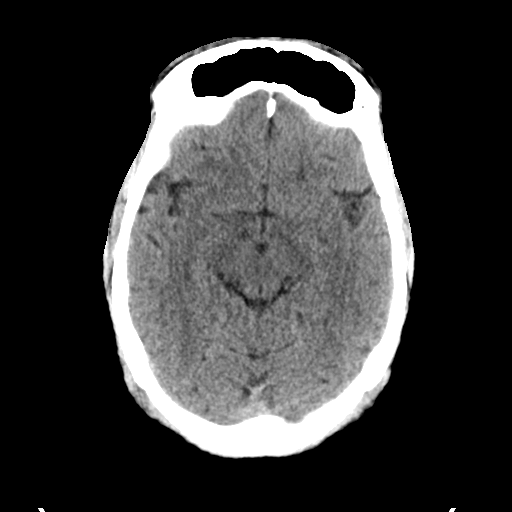
[im 18/35  brain]
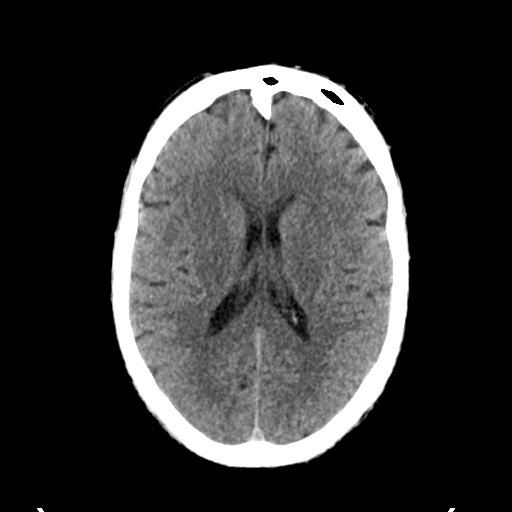
[im 22/35  brain]
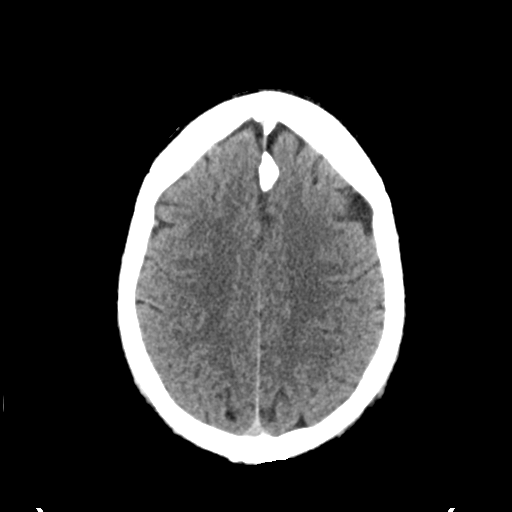
[im 22/35  bone]
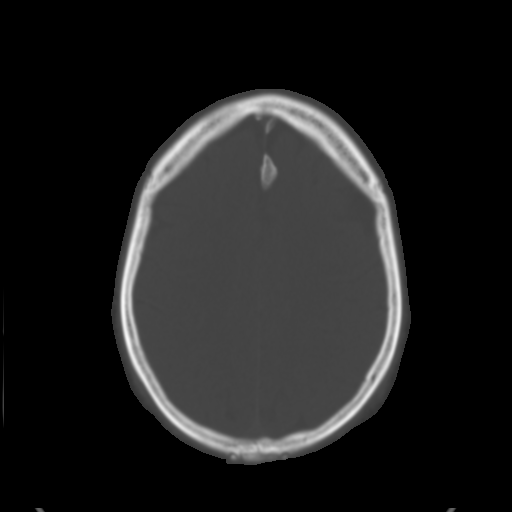
[im 26/35  brain]
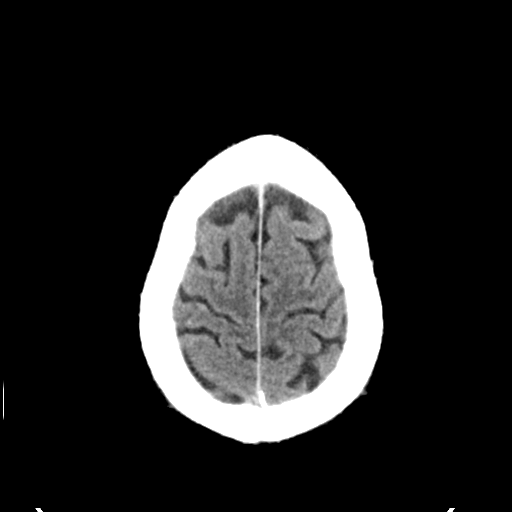
[im 30/35  brain]
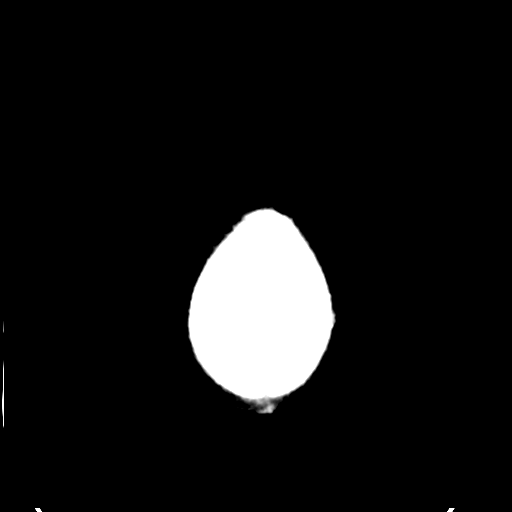

[Series 4: head bone · axial · 0.46mm/px · z∈[-93,-59]mm · 3 of 86 slices shown]
[im 9/86  bone]
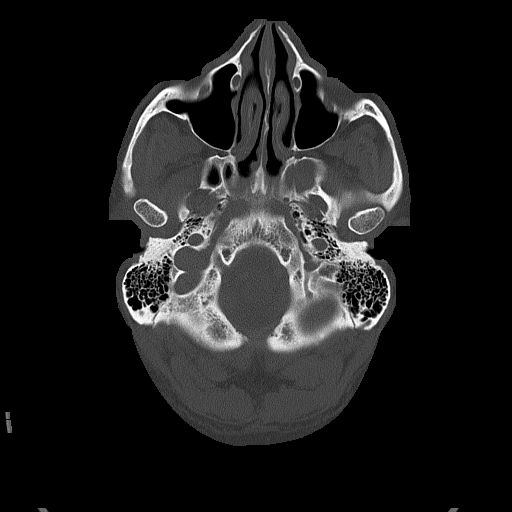
[im 18/86  bone]
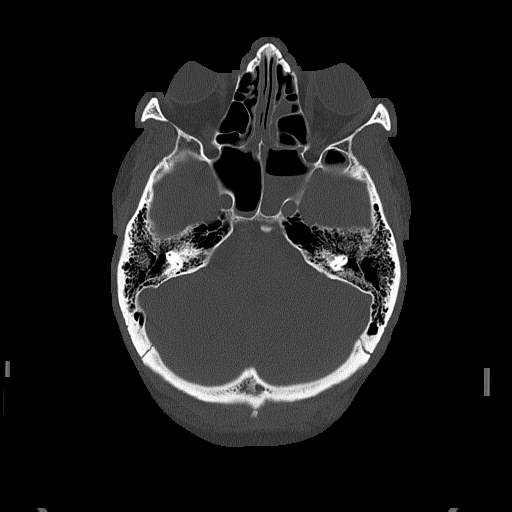
[im 26/86  bone]
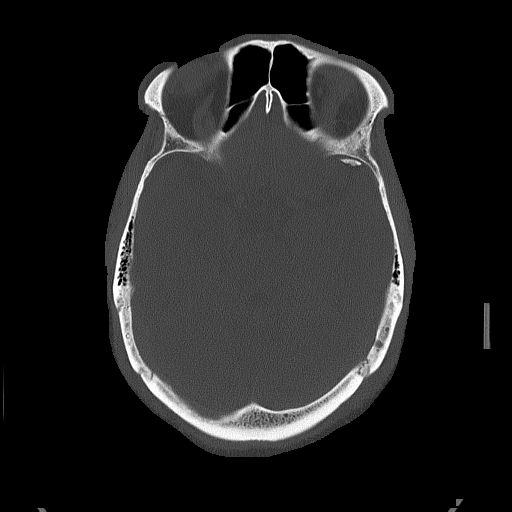

[Series 5: head without cor · coronal · non-contrast · 0.33mm/px · 3 of 78 slices shown]
[im 26/78  brain]
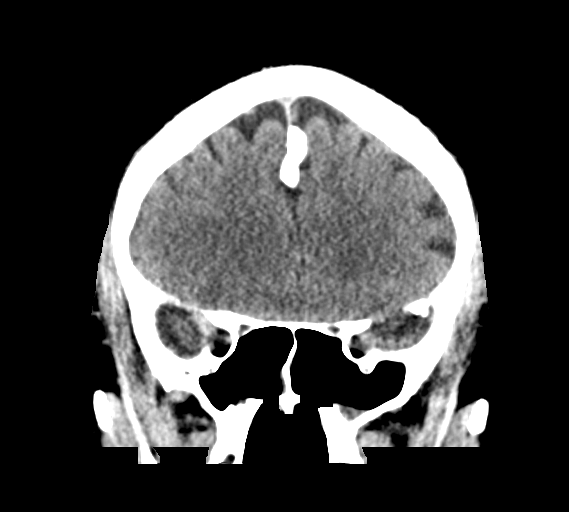
[im 35/78  brain]
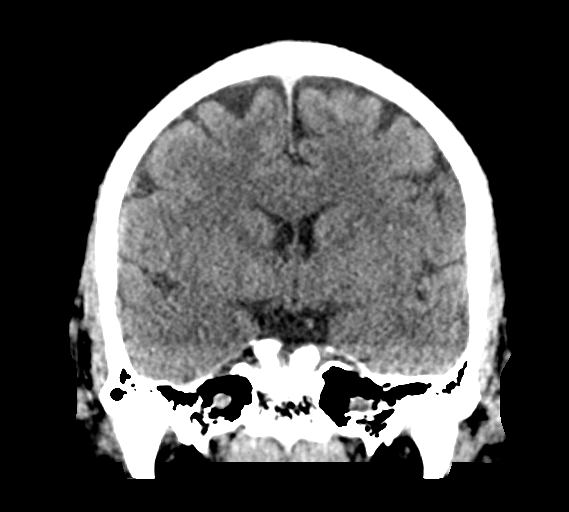
[im 43/78  brain]
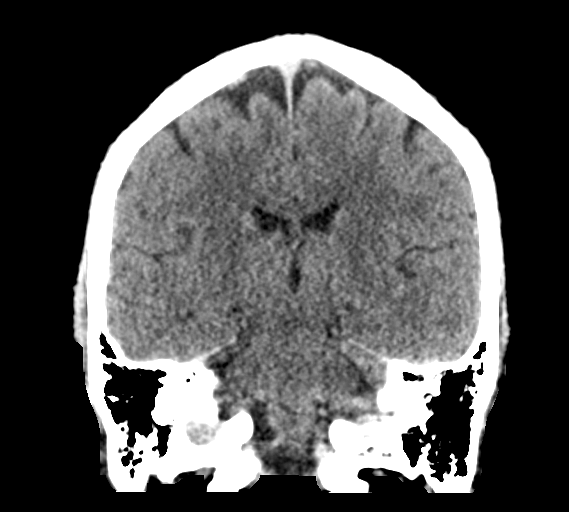

[Series 6: head without sag · sagittal · non-contrast · 0.35mm/px · 3 of 67 slices shown]
[im 23/67  brain]
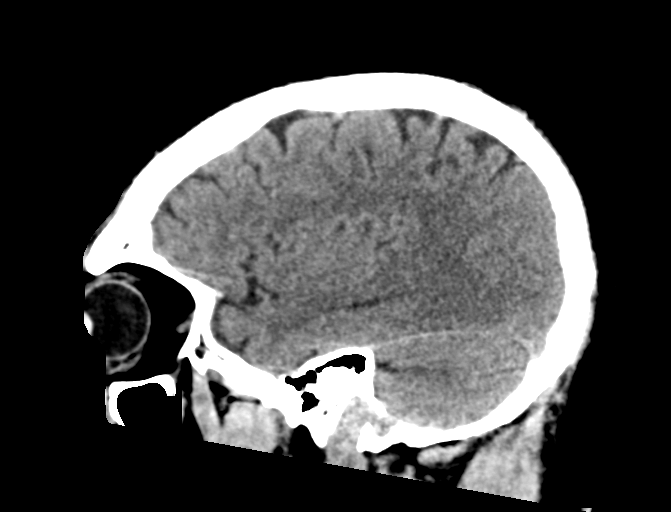
[im 34/67  brain]
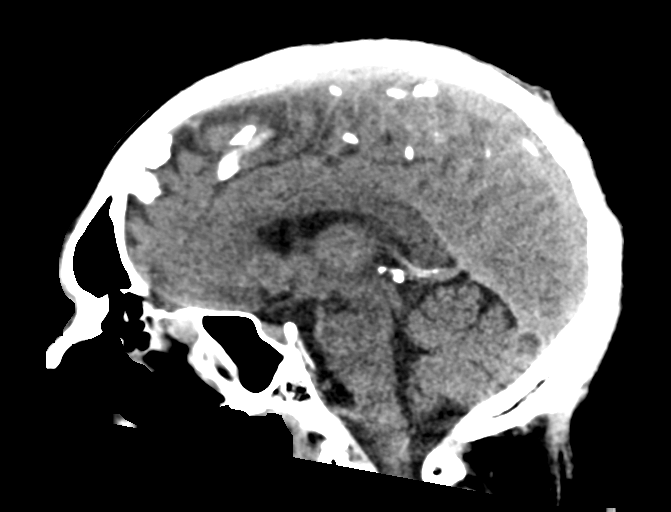
[im 45/67  brain]
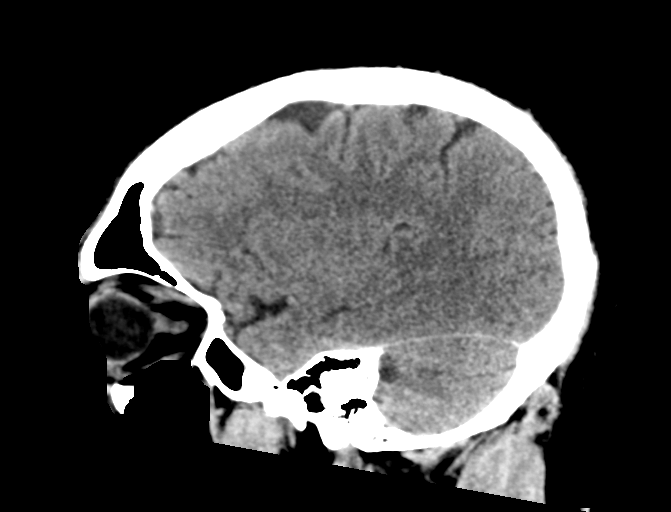

[16 of 47 positions shown; findings below may reference images not displayed]

FINDINGS: Brain: The ventricles and sulci appropriate size for patient's age.
The great white matter discrimination is preserved. There is no
acute intracranial hemorrhage. No mass effect or midline shift. No
extra-axial fluid collection.

Vascular: No hyperdense vessel or unexpected calcification.

Skull: Normal. Negative for fracture or focal lesion.

Sinuses/Orbits: There is diffuse mucoperiosteal thickening of
paranasal sinuses. There is opacification of multiple ethmoid air
cells. There is air-fluid level within the sphenoid sinuses, left
greater right. The mastoid air cells are clear.

Other: None
IMPRESSION: 1. Unremarkable noncontrast CT of the brain.
2. Paranasal sinus disease with air-fluid level within the sphenoid
sinuses. Clinical correlation is recommended.

## 2018-06-18 ENCOUNTER — Emergency Department (HOSPITAL_COMMUNITY)
Admission: EM | Admit: 2018-06-18 | Discharge: 2018-06-18 | Disposition: A | Discharge status: Home / Self Care | Payer: Medicaid Other | Attending: Emergency Medicine | Admitting: Emergency Medicine

## 2018-06-18 ENCOUNTER — Emergency Department (HOSPITAL_COMMUNITY): Payer: Medicaid Other

## 2018-06-18 ENCOUNTER — Encounter (HOSPITAL_COMMUNITY): Payer: Self-pay | Admitting: Emergency Medicine

## 2018-06-18 DIAGNOSIS — R1031 Right lower quadrant pain: Secondary | ICD-10-CM | POA: Diagnosis present

## 2018-06-18 DIAGNOSIS — Z79899 Other long term (current) drug therapy: Secondary | ICD-10-CM | POA: Insufficient documentation

## 2018-06-18 DIAGNOSIS — K5792 Diverticulitis of intestine, part unspecified, without perforation or abscess without bleeding: Secondary | ICD-10-CM | POA: Diagnosis not present

## 2018-06-18 DIAGNOSIS — F17210 Nicotine dependence, cigarettes, uncomplicated: Secondary | ICD-10-CM | POA: Diagnosis not present

## 2018-06-18 LAB — COMPREHENSIVE METABOLIC PANEL
ALBUMIN: 4.1 g/dL (ref 3.5–5.0)
ALK PHOS: 79 U/L (ref 38–126)
ALT: 20 U/L (ref 0–44)
AST: 20 U/L (ref 15–41)
Anion gap: 9 (ref 5–15)
BUN: 12 mg/dL (ref 6–20)
CALCIUM: 9.1 mg/dL (ref 8.9–10.3)
CO2: 26 mmol/L (ref 22–32)
Chloride: 105 mmol/L (ref 98–111)
Creatinine, Ser: 0.76 mg/dL (ref 0.61–1.24)
GFR calc Af Amer: >60 mL/min (ref >60)
GFR calc non Af Amer: >60 mL/min (ref >60)
GLUCOSE: 94 mg/dL (ref 70–99)
POTASSIUM: 3.8 mmol/L (ref 3.5–5.1)
SODIUM: 140 mmol/L (ref 135–145)
Total Bilirubin: 0.6 mg/dL (ref 0.3–1.2)
Total Protein: 7.8 g/dL (ref 6.5–8.1)

## 2018-06-18 LAB — CBC
HCT: 43.2 % (ref 39.0–52.0)
HEMOGLOBIN: 14.5 g/dL (ref 13.0–17.0)
MCH: 31.6 pg (ref 26.0–34.0)
MCHC: 33.6 g/dL (ref 30.0–36.0)
MCV: 94.1 fL (ref 78.0–100.0)
Platelets: 207 10*3/uL (ref 150–400)
RBC: 4.59 MIL/uL (ref 4.22–5.81)
RDW: 13.2 % (ref 11.5–15.5)
WBC: 8.1 10*3/uL (ref 4.0–10.5)

## 2018-06-18 LAB — LIPASE, BLOOD: Lipase: 30 U/L (ref 11–51)

## 2018-06-18 MED ORDER — METRONIDAZOLE 500 MG PO TABS: 500.0000 mg | ORAL_TABLET | Freq: Two times a day (BID) | ORAL | 0 refills | Status: DC

## 2018-06-18 MED ORDER — IOPAMIDOL (ISOVUE-300) INJECTION 61%
100.0000 mL | Freq: Once | INTRAVENOUS | Status: AC | PRN
  Administered 2018-06-18: 100 mL via INTRAVENOUS

## 2018-06-18 MED ORDER — CIPROFLOXACIN HCL 500 MG PO TABS: 500.0000 mg | ORAL_TABLET | Freq: Two times a day (BID) | ORAL | 0 refills | Status: DC

## 2018-06-18 MED ORDER — CIPROFLOXACIN HCL 500 MG PO TABS
500.0000 mg | ORAL_TABLET | Freq: Once | ORAL | Status: AC
  Administered 2018-06-18: 500 mg via ORAL
  Filled 2018-06-18: qty 1

## 2018-06-18 MED ORDER — ONDANSETRON 8 MG PO TBDP
8.0000 mg | ORAL_TABLET | Freq: Once | ORAL | Status: AC
  Administered 2018-06-18: 8 mg via ORAL
  Filled 2018-06-18: qty 1

## 2018-06-18 MED ORDER — SENNOSIDES-DOCUSATE SODIUM 8.6-50 MG PO TABS: 2.0000 | ORAL_TABLET | Freq: Every day | ORAL | 0 refills | Status: AC

## 2018-06-18 MED ORDER — IOPAMIDOL (ISOVUE-300) INJECTION 61%
INTRAVENOUS | Status: DC
Start: 2018-06-18 — End: 2018-06-18
  Filled 2018-06-18: qty 100

## 2018-06-18 MED ORDER — HYDROCODONE-ACETAMINOPHEN 5-325 MG PO TABS: 1.0000 | ORAL_TABLET | Freq: Four times a day (QID) | ORAL | 0 refills | Status: DC | PRN

## 2018-06-18 MED ORDER — METRONIDAZOLE 500 MG PO TABS
500.0000 mg | ORAL_TABLET | Freq: Once | ORAL | Status: AC
  Administered 2018-06-18: 500 mg via ORAL
  Filled 2018-06-18: qty 1

## 2018-06-18 MED ORDER — HYDROCODONE-ACETAMINOPHEN 5-325 MG PO TABS
1.0000 | ORAL_TABLET | Freq: Once | ORAL | Status: AC
  Administered 2018-06-18: 1 via ORAL
  Filled 2018-06-18: qty 1

## 2018-06-18 NOTE — ED Notes (Signed)
RN attempted IV access and blood draw without success

## 2018-06-18 NOTE — Discharge Instructions (Signed)
As discussed, it is important that you monitor your condition carefully, and do not hesitate to return for concerning changes. Otherwise be sure to follow-up with your gastroenterologist.

## 2018-06-18 NOTE — ED Triage Notes (Signed)
Pt reports that he had right arm, shoulder and neck pain for about 3 weeks and RLQ pains for 2 weeks. Denies any falls or injuries.

## 2018-06-18 NOTE — ED Provider Notes (Signed)
[facility] DEPT Provider Note   CSN: [id number] Arrival date & time: 06/18/18  0940     History   Chief Complaint Chief Complaint  Patient presents with  . Neck Pain  . Shoulder Pain  . Arm Pain  . Abdominal Pain    HPI Gene Anderson is a 57 y.o. male.  HPI Patient presents with concern of right lower quadrant abdominal pain. Onset was 1 week ago. Since onset symptoms been persistent. It is unclear if patient has had any particular change in bowel movements, but he notes variable color of his feces. There is some anorexia, nausea, but no vomiting. No fever, no chills. Patient has no history of abdominal surgery. Pain is focally in the right lower quadrant, nonradiating, sore, severe, pressure-like. Unclear if he is taking any medication for pain relief. Patient has other complaints, but they seem to precede this new development of abdominal pain, related to arm, shoulder discomfort.  Past Medical History:  Diagnosis Date  . Atrial fibrillation ([place])   . Diverticulosis   . Fatty liver 06/20/13  . Hyperplastic colon polyp   . Internal hemorrhoids   . Leukopenia   . Neck pain   . Prostate enlargement   . Spinal stenosis     Patient Active Problem List   Diagnosis Date Noted  . 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 Atrial fibrillation ([place]) 04/18/2014  . Paresthesia 02/16/2014  . Neck pain 02/16/2014  . Leukopenia 02/16/2013    Past Surgical History:  Procedure Laterality Date  . CARDIOVERSION  03/2014  . Flexible cystoscopy and repair of torn tunica albuginea    . ULNAR NERVE TRANSPOSITION Left 08/26/2017   Procedure: LEFT ELBOW ULNAR NEUROLYSIS;  Surgeon: Jessy Oto, MD;  Location: [facility];  Service: Orthopedics;  Laterality: Left;        Home Medications    Prior to Admission medications   Medication Sig Start Date End Date Taking? Authorizing Provider  celecoxib  (CELEBREX) 200 MG capsule Take 1 capsule (200 mg total) by mouth 2 (two) times daily. 01/12/18  Yes Jessy Oto, MD  diclofenac sodium (VOLTAREN) 1 % GEL APPLY 2 GRAMS EXTERNALLY TO THE AFFECTED AREA FOUR TIMES DAILY AS NEEDED FOR PAIN Patient taking differently: Apply 2 g topically 4 (four) times daily as needed (pain).  05/18/18  Yes Jessy Oto, MD  flecainide (TAMBOCOR) 100 MG tablet Take 1 tablet (100 mg total) by mouth daily as needed. for palpitations Patient taking differently: Take 100 mg by mouth daily as needed (palpitations).  04/04/16  Yes Evans Lance, MD  gabapentin (NEURONTIN) 300 MG capsule Take at night time for 3 days then at night and in the AM for 3 days  And then TID. Patient taking differently: Take 300 mg by mouth 3 (three) times daily.  12/09/17  Yes Jessy Oto, MD  levocetirizine (XYZAL) 5 MG tablet Take 1 tablet (5 mg total) by mouth every evening. 10/05/17  Yes [name], PA-C  metoprolol (LOPRESSOR) 50 MG tablet Take 1 tablet (50 mg total) by mouth daily as needed. for palpitations Patient taking differently: Take 50 mg by mouth daily as needed (palpitations).  04/04/16  Yes Evans Lance, MD  naphazoline-pheniramine (NAPHCON-A) 0.025-0.3 % ophthalmic solution Place 1 drop into both eyes 2 (two) times daily as needed for irritation or allergies (USES IN THE MORNING).    Yes [provider]  Oxycodone HCl 20 MG TABS Take 20 m by mouth every 6 (six) hours. 05/25/18  Yes [provider]  sulfamethoxazole-trimethoprim (BACTRIM DS,SEPTRA DS) 800-160 MG tablet Take 1 tablet by mouth 2 (two) times daily. Patient not taking: Reported on 06/18/2018 02/09/18   [name], PA-C    Family History Family History  Problem Relation Age of Onset  . Cancer Father   . Stroke Maternal Grandfather   . Cancer Paternal Grandmother     Social History Social History   Tobacco Use  . Smoking status: Current Every Day Smoker    Packs/day: 1.00     Years: 36.00    Pack years: 36.00    Types: Cigarettes  . Smokeless tobacco: Never Used  Substance Use Topics  . Alcohol use: Yes    Comment: social  . Drug use: No     Allergies   Augmentin [amoxicillin-pot clavulanate]   Review of Systems Review of Systems  Constitutional:       Per HPI, otherwise negative  HENT:       Per HPI, otherwise negative  Respiratory:       Per HPI, otherwise negative  Cardiovascular:       Per HPI, otherwise negative  Gastrointestinal: Positive for abdominal pain. Negative for vomiting.  Endocrine:       Negative aside from HPI  Genitourinary:       Neg aside from HPI   Musculoskeletal:       Per HPI, otherwise negative  Skin: Negative.   Neurological: Negative for syncope.     Physical Exam Updated Vital Signs BP 128/84 (BP Location: Left Arm)   Pulse 75   Temp 98.5 F (36.9 C) (Oral)   Resp 16   Ht 6' 1.5" (1.867 m)   SpO2 98%   BMI 28.24 kg/m   Physical Exam  Constitutional: He is oriented to person, place, and time. He appears well-developed. No distress.  HENT:  Head: Normocephalic and atraumatic.  Eyes: Conjunctivae and EOM are normal.  Cardiovascular: Normal rate and regular rhythm.  Pulmonary/Chest: Effort normal. No stridor. No respiratory distress.  Abdominal: He exhibits no distension. There is tenderness at McBurney's point.  Musculoskeletal: He exhibits no edema.  Patient complains of right shoulder pain, but he is holding his arm above his head during the evaluation, moving the arm freely, there is no deformity  Neurological: He is alert and oriented to person, place, and time.  Skin: Skin is warm and dry.  Psychiatric: He has a normal mood and affect.  Nursing note and vitals reviewed.    ED Treatments / Results  Labs (all labs ordered are listed, but only abnormal results are displayed) Labs Reviewed  LIPASE, BLOOD  COMPREHENSIVE METABOLIC PANEL  CBC  URINALYSIS, ROUTINE W REFLEX MICROSCOPIC     EKG None  Radiology Ct Abdomen Pelvis W Contrast  Result Date: 06/18/2018 CLINICAL DATA:  Right lower quadrant pain for 2 weeks EXAM: CT ABDOMEN AND PELVIS WITH CONTRAST TECHNIQUE: Multidetector CT imaging of the abdomen and pelvis was performed using the standard protocol following bolus administration of intravenous contrast. CONTRAST:  135mL ISOVUE-300 IOPAMIDOL (ISOVUE-300) INJECTION 61% COMPARISON:  None. FINDINGS: Lower chest: No acute abnormality. Hepatobiliary: No focal liver abnormality is seen. No gallstones, gallbladder wall thickening, or biliary dilatation. Pancreas: Unremarkable. No pancreatic ductal dilatation or surrounding inflammatory changes. Spleen: Normal in size without focal abnormality. Adrenals/Urinary Tract: Adrenal glands are within normal limits. The kidneys demonstrate no renal calculi or obstructive changes.  Bilateral renal cysts are noted. The bladder is partially distended. Stomach/Bowel: Diverticular disease is noted throughout the colon. In the ascending colon/distal terminal ileum, there is edematous change within the wall as well as surrounding inflammatory change. The appendix is well visualized and predominately air-filled without significant dilatation. These changes are likely related to focal inflammatory change within the cecum and distal terminal ileum. No evidence of perforation is seen. No abscess is noted. Vascular/Lymphatic: Aortic atherosclerosis. No enlarged abdominal or pelvic lymph nodes. Reproductive: Prostate is unremarkable. Other: No abdominal wall hernia or abnormality. No abdominopelvic ascites. Musculoskeletal: Degenerative changes of the lumbar spine are noted. No acute bony abnormality seen. IMPRESSION: Inflammatory changes in the right lower quadrant involving primarily the cecum and distal aspect of the terminal ileum. These inflammatory changes surround the appendix although it appears relatively normal in appearance. No abscess or  perforation is identified. There is some diverticular change in this region and this may simply represent cecal diverticulitis. Chronic changes as described above. These results were called by telephone at the time of interpretation on 06/18/2018 at 1:31 pm to Dr. Carmin Muskrat , who verbally acknowledged these results. Electronically Signed   By: Inez Catalina M.D.   On: 06/18/2018 13:34    Procedures Procedures (including critical care time)  Medications Ordered in ED Medications  iopamidol (ISOVUE-300) 61 % injection (has no administration in time range)  iopamidol (ISOVUE-300) 61 % injection 100 mL (100 mLs Intravenous Contrast Given 06/18/18 1302)     Initial Impression / Assessment and Plan / ED Course  I have reviewed the triage vital signs and the nursing notes.  Pertinent labs & imaging results that were available during my care of the patient were reviewed by me and considered in my medical decision making (see chart for details).     2:00 PM On repeat exam the patient is awake and alert. He is now accompanied by a male companion We discussed all findings including CT evidence for possible diverticulitis of the cecum. Patient is afebrile, no leukocytosis, and CT not indicative of appendicitis, nor is the patient's clinical course of pain for about a week, which is more consistent with diverticulitis. Patient started on antibiotics, and without evidence for peritonitis, bacteremia, sepsis, will follow up with GI.   Final Clinical Impressions(s) / ED Diagnoses  Acute diverticulitis   Carmin Muskrat, MD 06/18/18 1401

## 2018-07-06 ENCOUNTER — Ambulatory Visit (INDEPENDENT_AMBULATORY_CARE_PROVIDER_SITE_OTHER): Payer: 59 | Admitting: Specialist

## 2018-08-07 ENCOUNTER — Other Ambulatory Visit: Payer: Self-pay | Admitting: Podiatry

## 2018-08-07 ENCOUNTER — Ambulatory Visit (INDEPENDENT_AMBULATORY_CARE_PROVIDER_SITE_OTHER): Payer: 59 | Admitting: Podiatry

## 2018-08-07 ENCOUNTER — Ambulatory Visit (INDEPENDENT_AMBULATORY_CARE_PROVIDER_SITE_OTHER): Payer: 59

## 2018-08-07 ENCOUNTER — Encounter: Payer: Self-pay | Admitting: Podiatry

## 2018-08-07 DIAGNOSIS — M722 Plantar fascial fibromatosis: Secondary | ICD-10-CM

## 2018-08-07 DIAGNOSIS — M79672 Pain in left foot: Secondary | ICD-10-CM

## 2018-08-07 MED ORDER — TRIAMCINOLONE ACETONIDE 10 MG/ML IJ SUSP
10.0000 mg | Freq: Once | INTRAMUSCULAR | Status: AC
Start: 2018-08-07 — End: 2018-08-07
  Administered 2018-08-07: 10 mg

## 2018-08-07 MED ORDER — DICLOFENAC SODIUM 75 MG PO TBEC: 75.0000 mg | DELAYED_RELEASE_TABLET | Freq: Two times a day (BID) | ORAL | 2 refills | Status: DC

## 2018-08-07 NOTE — Patient Instructions (Signed)

## 2018-08-09 NOTE — Progress Notes (Signed)
Subjective:   Patient ID: Gene Anderson, male   DOB: 57 y.o.   MRN: [id number]   HPI Patient presents with pain in the bottom of the left heel states is been very tender and making it hard for him to walk and he had an injection from his primary care he was not sure if it was done properly and the pain did not get better.   ROS      Objective:  Physical Exam  Neurovascular status intact with exquisite discomfort left plantar heel at the insertion calcaneus with fluid buildup noted around the insertional point     Assessment:  Acute plantar fasciitis left with inflammation fluid in the medial band with depression of the arch     Plan:  H&P x-ray reviewed and today I did do an injection 3 mg Kenalog 5 mg Xylocaine and advised on physical therapy anti-inflammatories and applied fascial brace for lifting the arch.  Reappoint for [place] to recheck 2 weeks or earlier if needed  X-ray indicates that there is depression of the arch with no indications of other pathology with spur formation noted

## 2018-08-21 ENCOUNTER — Ambulatory Visit: Payer: 59 | Admitting: Podiatry

## 2018-08-31 ENCOUNTER — Encounter: Payer: Self-pay | Admitting: Podiatry

## 2018-08-31 ENCOUNTER — Ambulatory Visit (INDEPENDENT_AMBULATORY_CARE_PROVIDER_SITE_OTHER): Payer: 59 | Admitting: Podiatry

## 2018-08-31 DIAGNOSIS — M722 Plantar fascial fibromatosis: Secondary | ICD-10-CM

## 2018-09-01 NOTE — Progress Notes (Signed)
Subjective:   Patient ID: Gene Anderson, male   DOB: 57 y.o.   MRN: [id number]   HPI Patient presents stating that her foot is feeling quite a bit better with reduced discomfort and is able to walk distances without pain including cement floors   ROS      Objective:  Physical Exam  Neurovascular status intact with patient's left foot improving with discomfort still only upon deep palpation with moderate swelling still noted and flattening of the arch but overall improved     Assessment:  Doing well with plantar fasciitis left     Plan:  Reviewed continued physical therapy supportive therapy and anti-inflammatories.  Patient will be seen back as needed and I spent a great deal time educating him on plantar fasciitis and the fact that more aggressive treatments may be necessary in future

## 2019-02-04 ENCOUNTER — Other Ambulatory Visit: Payer: Self-pay | Admitting: Radiology

## 2019-02-04 ENCOUNTER — Encounter (INDEPENDENT_AMBULATORY_CARE_PROVIDER_SITE_OTHER): Payer: Self-pay | Admitting: Specialist

## 2019-02-04 MED ORDER — DICLOFENAC SODIUM 1 % TD GEL: TRANSDERMAL | 0 refills | Status: DC

## 2019-02-05 ENCOUNTER — Encounter (INDEPENDENT_AMBULATORY_CARE_PROVIDER_SITE_OTHER): Payer: Self-pay | Admitting: Specialist

## 2019-02-08 ENCOUNTER — Ambulatory Visit: Payer: Self-pay

## 2019-02-08 ENCOUNTER — Other Ambulatory Visit (INDEPENDENT_AMBULATORY_CARE_PROVIDER_SITE_OTHER): Payer: Self-pay | Admitting: Specialist

## 2019-02-08 ENCOUNTER — Encounter: Payer: Self-pay | Admitting: Specialist

## 2019-02-08 ENCOUNTER — Ambulatory Visit (INDEPENDENT_AMBULATORY_CARE_PROVIDER_SITE_OTHER): Payer: Medicaid Other

## 2019-02-08 ENCOUNTER — Ambulatory Visit (INDEPENDENT_AMBULATORY_CARE_PROVIDER_SITE_OTHER): Payer: Medicaid Other | Admitting: Specialist

## 2019-02-08 VITALS — BP 156/89 | HR 77 | Ht 73.0 in | Wt 217.0 lb

## 2019-02-08 DIAGNOSIS — M48062 Spinal stenosis, lumbar region with neurogenic claudication: Secondary | ICD-10-CM | POA: Diagnosis not present

## 2019-02-08 DIAGNOSIS — M1711 Unilateral primary osteoarthritis, right knee: Secondary | ICD-10-CM | POA: Diagnosis not present

## 2019-02-08 DIAGNOSIS — G8929 Other chronic pain: Secondary | ICD-10-CM

## 2019-02-08 DIAGNOSIS — M25561 Pain in right knee: Secondary | ICD-10-CM

## 2019-02-08 DIAGNOSIS — M25562 Pain in left knee: Secondary | ICD-10-CM | POA: Diagnosis not present

## 2019-02-08 DIAGNOSIS — M1712 Unilateral primary osteoarthritis, left knee: Secondary | ICD-10-CM | POA: Diagnosis not present

## 2019-02-08 DIAGNOSIS — M4316 Spondylolisthesis, lumbar region: Secondary | ICD-10-CM

## 2019-02-08 MED ORDER — METHYLPREDNISOLONE ACETATE 40 MG/ML IJ SUSP
40.0000 mg | INTRAMUSCULAR | Status: AC | PRN
  Administered 2019-02-08: 40 mg via INTRA_ARTICULAR

## 2019-02-08 MED ORDER — BUPIVACAINE HCL 0.25 % IJ SOLN
4.0000 mL | INTRAMUSCULAR | Status: AC | PRN
  Administered 2019-02-08: 4 mL via INTRA_ARTICULAR

## 2019-02-08 MED ORDER — DICLOFENAC SODIUM 1 % TD GEL: TRANSDERMAL | 0 refills | Status: AC

## 2019-02-08 MED ORDER — TRAMADOL HCL 50 MG PO TABS: 100.0000 mg | ORAL_TABLET | Freq: Four times a day (QID) | ORAL | 0 refills | Status: AC | PRN

## 2019-02-08 MED ORDER — DICLOFENAC SODIUM 75 MG PO TBEC: DELAYED_RELEASE_TABLET | ORAL | 3 refills | Status: DC

## 2019-02-08 NOTE — Progress Notes (Signed)
Office Visit Note   Patient: Gene Anderson           Date of Birth: [birth year]           MRN: [id number] Visit Date: 02/08/2019              Requested by: [facility], [facility] [address] [place], [place] [address] PCP: [facility], [facility]   Assessment & Plan: Visit Diagnoses:  1. Lumbar stenosis with neurogenic claudication   2. Chronic pain of both knees   3. Unilateral primary osteoarthritis, right knee   4. Unilateral primary osteoarthritis, left knee     Plan: Avoid bending, stooping and avoid lifting weights greater than 10 lbs. Avoid prolong standing and walking. Avoid frequent bending and stooping  No lifting greater than 10 lbs. May use ice or moist heat for pain. Weight loss is of benefit. Handicap license is approved. Dr. Romona Curls secretary/Assistant will call to arrange for epidural steroid injection    Knee is suffering from osteoarthritis, only real proven treatments are Weight loss, NSIADs like diclofenac, diclofenac gel and exercise. Well padded shoes help. Ice the knee 2-3 times a day 15-20 mins at a time.  Follow-Up Instructions: No follow-ups on file.   Orders:  Orders Placed This Encounter  Procedures   Large Joint Inj: R knee   Large Joint Inj: L knee   XR Lumbar Spine 2-3 Views   XR KNEE 3 VIEW LEFT   XR KNEE 3 VIEW RIGHT   No orders of the defined types were placed in this encounter.     Procedures: Large Joint Inj: R knee on 02/08/2019 3:47 PM Indications: pain Details: 25 G 1.5 in needle, anteromedial approach  Arthrogram: No  Medications: 4 mL bupivacaine 0.25 %; 40 mg methylPREDNISolone acetate 40 MG/ML Outcome: tolerated well, no immediate complications  bandaid applied. Procedure, treatment alternatives, risks and benefits explained, specific risks discussed. Consent was given by the patient. Immediately prior to procedure a time out was called to verify the correct patient, procedure, equipment, support staff and  site/side marked as required. Patient was prepped and draped in the usual sterile fashion.   Large Joint Inj: L knee on 02/08/2019 3:49 PM Indications: pain Details: 25 G 1.5 in needle, anterolateral approach  Arthrogram: No  Medications: 40 mg methylPREDNISolone acetate 40 MG/ML; 4 mL bupivacaine 0.25 % Outcome: tolerated well, no immediate complications  bandaid applied.  Procedure, treatment alternatives, risks and benefits explained, specific risks discussed. Consent was given by the patient. Immediately prior to procedure a time out was called to verify the correct patient, procedure, equipment, support staff and site/side marked as required. Patient was prepped and draped in the usual sterile fashion.       Clinical Data: No additional findings.   Subjective: Chief Complaint  Patient presents with   Lower Back - Follow-up   Right Knee - Follow-up   Left Knee - Follow-up    58 year old male with history of bilateral plantar fascitis, he has newer shoes and has used the voltaren gel but now he is experiencing bilateral knee pain and back pain. Stands on feet most of the day. No bowel or bladder difficulty. Feels a locking sensation in the knees, and has bilateral knee pain at night. He has pain with squatting and kneeling. Pain is his back worse with bending and stooping. When he does bend he has difficulty standing back up. Knee pain is worse on the left greater than the right, he  is having difficulty sleeping at night. In the past he has had spinal stenosis, neck pain. Previous left elbow neurolysis 08/26/2017 Just got back on medicaid card in January. Pain is in the shoulders down into the knees, but mainly in the low back. Got a new bed and is still experiencing pain. Numbness left anterolateral thigh. Weakness and difficulty exercising because everything hurts.    Review of Systems  Constitutional: Negative.   HENT: Negative.   Eyes: Negative.   Respiratory: Negative.    Cardiovascular: Negative.   Gastrointestinal: Negative.   Endocrine: Negative for cold intolerance, heat intolerance, polydipsia and polyphagia.  Genitourinary: Negative for decreased urine volume, difficulty urinating, dysuria, enuresis, flank pain, hematuria, testicular pain and urgency.  Musculoskeletal: Positive for arthralgias, back pain, gait problem and joint swelling.  Skin: Negative for color change, pallor and rash.  Allergic/Immunologic: Negative for environmental allergies, food allergies and immunocompromised state.  Neurological: Positive for weakness. Negative for dizziness, tremors, seizures, syncope, facial asymmetry, speech difficulty, light-headedness, numbness and headaches.  Psychiatric/Behavioral: Negative.      Objective: Vital Signs: BP (!) 156/89 (BP Location: Left Arm, Patient Position: Sitting)    Pulse 77    Ht 6\' 1"  (1.854 m)    Wt 217 lb (98.4 kg)    BMI 28.63 kg/m   Physical Exam Constitutional:      Appearance: He is well-developed.  HENT:     Head: Normocephalic and atraumatic.  Eyes:     Pupils: Pupils are equal, round, and reactive to light.  Neck:     Musculoskeletal: Normal range of motion and neck supple.  Pulmonary:     Effort: Pulmonary effort is normal.     Breath sounds: Normal breath sounds.  Abdominal:     General: Bowel sounds are normal.     Palpations: Abdomen is soft.  Musculoskeletal:     Right knee: He exhibits no effusion.     Left knee: He exhibits no effusion.  Skin:    General: Skin is warm and dry.  Neurological:     Mental Status: He is alert and oriented to person, place, and time.  Psychiatric:        Behavior: Behavior normal.        Thought Content: Thought content normal.        Judgment: Judgment normal.     Right Knee Exam   Tenderness  The patient is experiencing tenderness in the patella.  Range of Motion  Extension: abnormal  Flexion: normal   Tests  McMurray:  Medial - negative Lateral -  negative Varus: negative Valgus: negative Lachman:  Anterior - negative    Posterior - negative Drawer:  Anterior - negative     Pivot shift: negative Patellar apprehension: 2+  Other  Erythema: absent Scars: absent Sensation: normal Pulse: present Swelling: mild Effusion: no effusion present   Left Knee Exam   Range of Motion  Extension: abnormal  Flexion: normal   Tests  McMurray:  Medial - negative Lateral - negative Varus: negative Valgus: negative Lachman:  Anterior - negative    Posterior - negative Drawer:  Anterior - negative      Pivot shift: negative Patellar apprehension: 2+  Other  Erythema: absent Scars: absent Sensation: normal Pulse: present Swelling: mild Effusion: no effusion present   Back Exam   Tenderness  The patient is experiencing tenderness in the lumbar.  Range of Motion  Extension: abnormal  Flexion: abnormal  Lateral bend right: abnormal  Lateral bend left:  abnormal  Rotation right: abnormal  Rotation left: abnormal   Muscle Strength  Right Quadriceps:  5/5  Left Quadriceps:  5/5  Right Hamstrings:  5/5  Left Hamstrings:  5/5   Reflexes  Patellar:  2/4 normal Achilles: 2/4 Babinski's sign: normal   Other  Toe walk: normal Heel walk: normal Sensation: normal Gait: normal  Erythema: no back redness Scars: absent      Specialty Comments:  No specialty comments available.  Imaging: Xr Knee 3 View Left  Result Date: 02/08/2019 AP and lateral radiographs of the left knee AP of both standing and patellofemoral joint line tangential views show varus deformity of the left knee with joint line narrowing of the medial joint line and minimal spur medial joint line  And moderate superior patella pole osteophyte and mild inferior pole patella osteophye. Sunrise view with lateral patello femoral osteoarthritis changes.   Xr Knee 3 View Right  Result Date: 02/08/2019 AP and lateral radiographs of the right knee AP of both  standing and patellofemoral joint line tangential views show varus deformity of the right knee with joint line narrowing of the medial joint line and minimal spur medial joint line  And moderate superior patella pole osteophyte and mild inferior pole patella osteophye. Sunrise view with lateral patello femoral osteoarthritis changes. Subchondral sclerosis bilateral medial tibial plateau.    PMFS History: Patient Active Problem List   Diagnosis Date Noted   Cubital tunnel syndrome on left 08/26/2017    Priority: High    Class: Chronic   Atrial fibrillation ([place]) 04/18/2014   Paresthesia 02/16/2014   Neck pain 02/16/2014   Leukopenia 02/16/2013   Past Medical History:  Diagnosis Date   Atrial fibrillation ([place])    Diverticulosis    Fatty liver 06/20/13   Hyperplastic colon polyp    Internal hemorrhoids    Leukopenia    Neck pain    Prostate enlargement    Spinal stenosis     Family History  Problem Relation Age of Onset   Cancer Father    Stroke Maternal Grandfather    Cancer Paternal Grandmother     Past Surgical History:  Procedure Laterality Date   CARDIOVERSION  03/2014   Flexible cystoscopy and repair of torn tunica albuginea     ULNAR NERVE TRANSPOSITION Left 08/26/2017   Procedure: LEFT ELBOW ULNAR NEUROLYSIS;  Surgeon: Jessy Oto, MD;  Location: [facility];  Service: Orthopedics;  Laterality: Left;   Social History   Occupational History    Employer: [employer]  Tobacco Use   Smoking status: Current Every Day Smoker    Packs/day: 1.00    Years: 36.00    Pack years: 36.00    Types: Cigarettes   Smokeless tobacco: Never Used  Substance and Sexual Activity   Alcohol use: Yes    Comment: social   Drug use: No   Sexual activity: Not on file

## 2019-02-08 NOTE — Patient Instructions (Signed)
Avoid bending, stooping and avoid lifting weights greater than 10 lbs. Avoid prolong standing and walking. Avoid frequent bending and stooping  No lifting greater than 10 lbs. May use ice or moist heat for pain. Weight loss is of benefit. Handicap license is approved. Dr. Romona Curls secretary/Assistant will call to arrange for epidural steroid injection    Knee is suffering from osteoarthritis, only real proven treatments are Weight loss, NSIADs like diclofenac, diclofenac gel and exercise. Well padded shoes help. Ice the knee 2-3 times a day 15-20 mins at a time.

## 2019-04-27 ENCOUNTER — Ambulatory Visit (INDEPENDENT_AMBULATORY_CARE_PROVIDER_SITE_OTHER): Payer: Medicaid Other | Admitting: Physical Medicine and Rehabilitation

## 2019-04-27 ENCOUNTER — Ambulatory Visit: Payer: Self-pay

## 2019-04-27 VITALS — BP 143/93 | HR 76

## 2019-04-27 DIAGNOSIS — M5416 Radiculopathy, lumbar region: Secondary | ICD-10-CM | POA: Diagnosis not present

## 2019-04-27 DIAGNOSIS — M48062 Spinal stenosis, lumbar region with neurogenic claudication: Secondary | ICD-10-CM | POA: Diagnosis not present

## 2019-04-27 MED ORDER — BETAMETHASONE SOD PHOS & ACET 6 (3-3) MG/ML IJ SUSP
12.0000 mg | Freq: Once | INTRAMUSCULAR | Status: AC
  Administered 2019-04-27: 13:00:00 12 mg

## 2019-04-27 NOTE — Progress Notes (Signed)
.  Numeric Pain Rating Scale and Functional Assessment Average Pain 7   In the last MONTH (on 0-10 scale) has pain interfered with the following?  1. General activity like being  able to carry out your everyday physical activities such as walking, climbing stairs, carrying groceries, or moving a chair?  Rating(7)   +Driver, -BT, -Dye Allergies.   

## 2019-04-28 NOTE — Procedures (Signed)
Lumbosacral Transforaminal Epidural Steroid Injection - Sub-Pedicular Approach with Fluoroscopic Guidance  Patient: Gene Anderson      Date of Birth: [birth year] MRN: [id number] PCP: [facility]      Visit Date: 04/27/2019   Universal Protocol:    Date/Time: 04/27/2019  Consent Given By: the patient  Position: PRONE  Additional Comments: Vital signs were monitored before and after the procedure. Patient was prepped and draped in the usual sterile fashion. The correct patient, procedure, and site was verified.   Injection Procedure Details:  Procedure Site One Meds Administered:  Meds ordered this encounter  Medications  . betamethasone acetate-betamethasone sodium phosphate (CELESTONE) injection 12 mg    Laterality: Left  Location/Site:  L4-L5  Needle size: 22 G  Needle type: Spinal  Needle Placement: Transforaminal  Findings:    -Comments: Excellent flow of contrast along the nerve and into the epidural space.  Procedure Details: After squaring off the end-plates to get a true AP view, the C-arm was positioned so that an oblique view of the foramen as noted above was visualized. The target area is just inferior to the "nose of the scotty dog" or sub pedicular. The soft tissues overlying this structure were infiltrated with 2-3 ml. of 1% Lidocaine without Epinephrine.  The spinal needle was inserted toward the target using a "trajectory" view along the fluoroscope beam.  Under AP and lateral visualization, the needle was advanced so it did not puncture dura and was located close the 6 O'Clock position of the pedical in AP tracterory. Biplanar projections were used to confirm position. Aspiration was confirmed to be negative for CSF and/or blood. A 1-2 ml. volume of Isovue-250 was injected and flow of contrast was noted at each level. Radiographs were obtained for documentation purposes.   After attaining the desired flow of contrast documented above, a 0.5 to  1.0 ml test dose of 0.25% Marcaine was injected into each respective transforaminal space.  The patient was observed for 90 seconds post injection.  After no sensory deficits were reported, and normal lower extremity motor function was noted,   the above injectate was administered so that equal amounts of the injectate were placed at each foramen (level) into the transforaminal epidural space.   Additional Comments:  The patient tolerated the procedure well Dressing: 2 x 2 sterile gauze and Band-Aid    Post-procedure details: Patient was observed during the procedure. Post-procedure instructions were reviewed.  Patient left the clinic in stable condition.

## 2019-04-28 NOTE — Progress Notes (Signed)
Gene Anderson - 58 y.o. male MRN [id number]  Date of birth: 10/30/1960  Office Visit Note: Visit Date: 04/27/2019 PCP: [facility] Referred by: [facility], [facility]  Subjective: Chief Complaint  Patient presents with  . Lower Back - Pain   HPI:  Gene Anderson is a 58 y.o. male who comes in today At the request of Dr. [name] for left L4 transforaminal injection diagnostically and hopefully therapeutically for multilevel lumbar stenosis and epidural lipomatosis.  Patient's had no MRI since I have seen him in the past that does show worsening stenosis.  I saw him a couple years ago for interlaminar injection at L4-5.  He is having left more than right low back and hip and leg pain.  He is failed all manner of conservative care otherwise.  ROS Otherwise per HPI.  Assessment & Plan: Visit Diagnoses:  1. Lumbar radiculopathy   2. Spinal stenosis of lumbar region with neurogenic claudication     Plan: No additional findings.   Meds & Orders:  Meds ordered this encounter  Medications  . betamethasone acetate-betamethasone sodium phosphate (CELESTONE) injection 12 mg    Orders Placed This Encounter  Procedures  . XR C-ARM NO REPORT  . Epidural Steroid injection    Follow-up: No follow-ups on file.   Procedures: No procedures performed  Lumbosacral Transforaminal Epidural Steroid Injection - Sub-Pedicular Approach with Fluoroscopic Guidance  Patient: Gene Anderson      Date of Birth: January 05, 1961 MRN: [id number] PCP: [facility]      Visit Date: 04/27/2019   Universal Protocol:    Date/Time: 04/27/2019  Consent Given By: the patient  Position: PRONE  Additional Comments: Vital signs were monitored before and after the procedure. Patient was prepped and draped in the usual sterile fashion. The correct patient, procedure, and site was verified.   Injection Procedure Details:  Procedure Site One Meds Administered:  Meds ordered this encounter   Medications  . betamethasone acetate-betamethasone sodium phosphate (CELESTONE) injection 12 mg    Laterality: Left  Location/Site:  L4-L5  Needle size: 22 G  Needle type: Spinal  Needle Placement: Transforaminal  Findings:    -Comments: Excellent flow of contrast along the nerve and into the epidural space.  Procedure Details: After squaring off the end-plates to get a true AP view, the C-arm was positioned so that an oblique view of the foramen as noted above was visualized. The target area is just inferior to the "nose of the scotty dog" or sub pedicular. The soft tissues overlying this structure were infiltrated with 2-3 ml. of 1% Lidocaine without Epinephrine.  The spinal needle was inserted toward the target using a "trajectory" view along the fluoroscope beam.  Under AP and lateral visualization, the needle was advanced so it did not puncture dura and was located close the 6 O'Clock position of the pedical in AP tracterory. Biplanar projections were used to confirm position. Aspiration was confirmed to be negative for CSF and/or blood. A 1-2 ml. volume of Isovue-250 was injected and flow of contrast was noted at each level. Radiographs were obtained for documentation purposes.   After attaining the desired flow of contrast documented above, a 0.5 to 1.0 ml test dose of 0.25% Marcaine was injected into each respective transforaminal space.  The patient was observed for 90 seconds post injection.  After no sensory deficits were reported, and normal lower extremity motor function was noted,   the above injectate was administered so that equal  amounts of the injectate were placed at each foramen (level) into the transforaminal epidural space.   Additional Comments:  The patient tolerated the procedure well Dressing: 2 x 2 sterile gauze and Band-Aid    Post-procedure details: Patient was observed during the procedure. Post-procedure instructions were reviewed.  Patient left the  clinic in stable condition.    Clinical History: MRI LUMBAR SPINE WITHOUT CONTRAST  TECHNIQUE: Multiplanar, multisequence MR imaging of the lumbar spine was performed. No intravenous contrast was administered.  COMPARISON:  12/09/2017.  MRI of the lumbar spine 02/26/2013  FINDINGS: Segmentation: 5 non rib-bearing lumbar type vertebral bodies are present.  Alignment: Grade 1 anterolisthesis is present at L4-5. AP alignment is otherwise anatomic.  Vertebrae: Concave end plates are similar the prior studies. Chronic endplate marrow changes are worse left than right at T12-L1. Fatty infiltration into the sacrum and iliac bones is noted.  Conus medullaris and cauda equina: Conus extends to the L1 level. Conus and cauda equina appear normal.  Paraspinal and other soft tissues: Limited imaging the abdomen is unremarkable. There is no significant adenopathy.  Disc levels:  The disc levels at L1-2 and L2-3 are unremarkable.  L3-4: A broad-based disc protrusion is present. Moderate facet hypertrophy is slightly worse. There is progressive epidural fat with crowding of the nerve roots in the thecal sac. Mild foraminal narrowing has progressed bilaterally.  L4-5: A broad-based disc protrusion is evident. Advanced facet hypertrophy is again noted. Prominent epidural fat is present. The combination compresses the nerve roots. Mild foraminal narrowing demonstrate some progression bilaterally.  L5-S1: A broad-based disc protrusion is present. Prominent epidural fat is noted. Mild facet hypertrophy has progressed.  IMPRESSION: 1. 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 2. Progressive mild foraminal narrowing bilaterally at L3-4 and L4-5. 3. The foramina at L5-S1 are patent. 4. Grade 1 anterolisthesis at L4-5.   Electronically Signed   By:  San Morelle M.D.   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     Objective:  VS:  HT:    WT:   BMI:     BP:(!) 143/93  HR:76bpm  TEMP: ( )  RESP:  Physical Exam  Ortho Exam Imaging: Xr C-arm No Report  Result Date: 04/27/2019 Please see Notes tab for imaging impression.

## 2019-05-14 HISTORY — PX: CATARACT EXTRACTION: SUR2

## 2019-05-20 ENCOUNTER — Ambulatory Visit (INDEPENDENT_AMBULATORY_CARE_PROVIDER_SITE_OTHER): Payer: Medicaid Other | Admitting: Specialist

## 2019-05-20 ENCOUNTER — Other Ambulatory Visit: Payer: Self-pay

## 2019-05-20 ENCOUNTER — Encounter: Payer: Self-pay | Admitting: Specialist

## 2019-05-20 VITALS — BP 134/91 | HR 78 | Ht 73.0 in | Wt 217.0 lb

## 2019-05-20 DIAGNOSIS — M48062 Spinal stenosis, lumbar region with neurogenic claudication: Secondary | ICD-10-CM | POA: Diagnosis not present

## 2019-05-20 DIAGNOSIS — M4316 Spondylolisthesis, lumbar region: Secondary | ICD-10-CM

## 2019-05-20 DIAGNOSIS — M1712 Unilateral primary osteoarthritis, left knee: Secondary | ICD-10-CM | POA: Diagnosis not present

## 2019-05-20 DIAGNOSIS — M1711 Unilateral primary osteoarthritis, right knee: Secondary | ICD-10-CM

## 2019-05-20 NOTE — Progress Notes (Signed)
Office Visit Note   Patient: Gene Anderson           Date of Birth: 09-23-1961           MRN: [id number] Visit Date: 05/20/2019              Requested by: [facility] [address],  [place] [address] PCP: [facility]   Assessment & Plan: Visit Diagnoses:  1. Unilateral primary osteoarthritis, left knee   2. Unilateral primary osteoarthritis, right knee   3. Spinal stenosis of lumbar region with neurogenic claudication   4. Spondylolisthesis, lumbar region     Plan: Avoid bending, stooping and avoid lifting weights greater than 10 lbs. Avoid prolong standing and walking. Order for a new walker with wheels. Surgery scheduling secretary Kandice Hams, will call you in the next week to schedule for surgery.  Surgery recommended is a three level lumbar decompressive laminectomy L3-4,L4-5 and L5-S1 with fusion at L4-5 this would be done with rods, screws and cages with local bone graft and allograft (donor bone graft). Take oxycodone from pain management Dr. Mirna Mires for pain. Risk of surgery includes risk of infection 1 in 200 patients, bleeding 1/2% chance you would need a transfusion.   Risk to the nerves is one in 10,000. You will need to use a brace for 3 months and wean from the brace on the 4th month. Expect improved walking and standing tolerance. Expect relief of leg pain but numbness may persist depending on the length and degree of pressure that has been present. I would expect improved standing and walking tolerance and eventually knee replacement will also improve your overall pain.    Follow-Up Instructions: No follow-ups on file.   Orders:  No orders of the defined types were placed in this encounter.  No orders of the defined types were placed in this encounter.     Procedures: No procedures performed   Clinical Data: No additional findings.   Subjective: Chief Complaint  Patient presents with  . Lower Back - Follow-up     58 year old male with history of back pain and radiation into both legs through the knees anteriorly. Pain is worsened with standing and walking and he is able to grocery shop it just take more time to due it. Wife is doing most of the shopping, wife works for [employer] works for [employer]. Now takes oxycodone 20 mg ER for pain intermittantly. He takes diclofenac 2 pills per day of that, and uses gel on the knees and elbows and the back. No bowel or bladder difficulties. He can not walk a mile, he can stop and go walk a block. Incline going up hill is painful for back and knees. His legs legs feel weak and are sore. There is knee pain, some stiffness left greater than the right. The pain level both knees is the same. Will the ESI the knee pain did not improve.The back pain improved. Walking and going up stairs worsens the knee pain. Injection of the knees bilateral, helped for a couple of weeks and then came right back.    Review of Systems  Constitutional: Negative.   HENT: Negative.   Eyes: Negative.   Respiratory: Negative.   Cardiovascular: Negative.   Gastrointestinal: Negative.   Endocrine: Negative.   Genitourinary: Negative.   Musculoskeletal: Negative.   Skin: Negative.   Allergic/Immunologic: Negative.   Neurological: Negative.   Hematological: Negative.   Psychiatric/Behavioral: Negative.  Objective: Vital Signs: BP (!) 134/91   Pulse 78   Ht 6\' 1"  (1.854 m)   Wt 217 lb (98.4 kg)   BMI 28.63 kg/m   Physical Exam Constitutional:      Appearance: He is well-developed.  HENT:     Head: Normocephalic and atraumatic.  Eyes:     Pupils: Pupils are equal, round, and reactive to light.  Neck:     Musculoskeletal: Normal range of motion and neck supple.  Pulmonary:     Effort: Pulmonary effort is normal.     Breath sounds: Normal breath sounds.  Abdominal:     General: Bowel sounds are normal.     Palpations: Abdomen is soft.  Musculoskeletal:      Right knee: He exhibits effusion.     Left knee: He exhibits no effusion.  Skin:    General: Skin is warm and dry.  Neurological:     Mental Status: He is alert and oriented to person, place, and time.  Psychiatric:        Behavior: Behavior normal.        Thought Content: Thought content normal.        Judgment: Judgment normal.     Right Knee Exam   Muscle Strength  The patient has normal right knee strength.  Tenderness  The patient is experiencing tenderness in the medial joint line, lateral joint line and patella.  Range of Motion  Extension: -5  Flexion: 120   Tests  Varus: positive  Lachman:  Anterior - negative    Posterior - negative Drawer:  Anterior - negative    Posterior - negative Pivot shift: negative Patellar apprehension: positive  Other  Erythema: absent Scars: absent Sensation: normal Pulse: present Swelling: moderate Effusion: effusion present   Left Knee Exam   Range of Motion  Extension: -10  Flexion: 110   Tests  McMurray:  Medial - positive  Varus: positive  Lachman:  Anterior - negative    Posterior - negative Drawer:  Anterior - negative      Pivot shift: negative Patellar apprehension: positive  Other  Erythema: absent Scars: absent Sensation: normal Pulse: present Swelling: mild Effusion: no effusion present   Back Exam   Tenderness  The patient is experiencing tenderness in the lumbar.  Range of Motion  Extension: abnormal  Flexion: normal  Lateral bend right: normal  Lateral bend left: normal  Rotation right: normal  Rotation left: normal   Muscle Strength  Right Quadriceps:  5/5  Left Quadriceps:  5/5  Right Hamstrings:  5/5  Left Hamstrings:  5/5   Tests  Straight leg raise right: negative Straight leg raise left: negative  Reflexes  Patellar: 0/4 Achilles: 1/4 Babinski's sign: normal   Other  Toe walk: abnormal Heel walk: abnormal Sensation: normal Gait: normal  Erythema: no back redness  Scars: absent      Specialty Comments:  No specialty comments available.  Imaging: No results found.   PMFS History: Patient Active Problem List   Diagnosis Date Noted  . Cubital tunnel syndrome on left 08/26/2017    Priority: High    Class: Chronic  . Atrial fibrillation ([facility]) 04/18/2014  . Paresthesia 02/16/2014  . Neck pain 02/16/2014  . Leukopenia 02/16/2013   Past Medical History:  Diagnosis Date  . Atrial fibrillation ([facility])   . Diverticulosis   . Fatty liver 06/20/13  . Hyperplastic colon polyp   . Internal hemorrhoids   . Leukopenia   . Neck pain   .  Prostate enlargement   . Spinal stenosis     Family History  Problem Relation Age of Onset  . Cancer Father   . Stroke Maternal Grandfather   . Cancer Paternal Grandmother     Past Surgical History:  Procedure Laterality Date  . CARDIOVERSION  03/2014  . Flexible cystoscopy and repair of torn tunica albuginea    . ULNAR NERVE TRANSPOSITION Left 08/26/2017   Procedure: LEFT ELBOW ULNAR NEUROLYSIS;  Surgeon: Jessy Oto, MD;  Location: [facility];  Service: Orthopedics;  Laterality: Left;   Social History   Occupational History    Employer: [employer]  Tobacco Use  . Smoking status: Current Every Day Smoker    Packs/day: 1.00    Years: 36.00    Pack years: 36.00    Types: Cigarettes  . Smokeless tobacco: Never Used  Substance and Sexual Activity  . Alcohol use: Yes    Comment: social  . Drug use: No  . Sexual activity: Not on file

## 2019-06-01 ENCOUNTER — Encounter (INDEPENDENT_AMBULATORY_CARE_PROVIDER_SITE_OTHER): Payer: Self-pay | Admitting: Ophthalmology

## 2019-06-01 ENCOUNTER — Ambulatory Visit (INDEPENDENT_AMBULATORY_CARE_PROVIDER_SITE_OTHER): Payer: Medicaid Other | Admitting: Ophthalmology

## 2019-06-01 ENCOUNTER — Other Ambulatory Visit: Payer: Self-pay

## 2019-06-01 DIAGNOSIS — H4422 Degenerative myopia, left eye: Secondary | ICD-10-CM | POA: Diagnosis not present

## 2019-06-01 DIAGNOSIS — H5213 Myopia, bilateral: Secondary | ICD-10-CM

## 2019-06-01 DIAGNOSIS — H25811 Combined forms of age-related cataract, right eye: Secondary | ICD-10-CM

## 2019-06-01 DIAGNOSIS — H15832 Staphyloma posticum, left eye: Secondary | ICD-10-CM | POA: Diagnosis not present

## 2019-06-01 DIAGNOSIS — H3581 Retinal edema: Secondary | ICD-10-CM | POA: Diagnosis not present

## 2019-06-01 DIAGNOSIS — Z961 Presence of intraocular lens: Secondary | ICD-10-CM

## 2019-06-01 DIAGNOSIS — H35412 Lattice degeneration of retina, left eye: Secondary | ICD-10-CM

## 2019-06-01 NOTE — Progress Notes (Signed)
[facility] Note  06/01/2019     CHIEF COMPLAINT Patient presents for Retina Evaluation   HISTORY OF PRESENT ILLNESS: Gene Anderson is a 58 y.o. male who presents to the clinic today for:   HPI    Retina Evaluation    In left eye.  Associated Symptoms Floaters.  Negative for Flashes, Blind Spot, Photophobia, Scalp Tenderness, Fever, Pain, Glare, Jaw Claudication, Weight Loss, Distortion, Redness, Trauma, Shoulder/Hip pain and Fatigue.  Context:  distance vision, mid-range vision and near vision.  Treatments tried include no treatments.  I, the attending physician,  performed the HPI with the patient and updated documentation appropriately.          Comments    Patient referred by Dr. Katy Fitch for possible RD OS. Had CE with IOL OS on 08.14.2020 with Dr. Katy Fitch. Vision foggy OS since cataract surgery and OS stays red. No eye pain. Patient has some floaters OS. Floaters are not new. No recent increase in floaters. Patient denies symptoms of flashes, curtain/veil over vision. On Pred Forte qid OS.       Last edited by [name], MD on 06/01/2019 10:14 PM. (History)    pt states he saw Dr. Wyatt Portela this morning, pt states his vision has been foggy since cataract sx OS, he states Dr. Katy Fitch saw something in the back of his eye that he had not seen before and wanted it checked out, pt denies being diabetic or hypertensive  Referring physician: Debbra Riding, MD [address] [place],  [place] [address]  HISTORICAL INFORMATION:   Selected notes from the MEDICAL RECORD NUMBER Referred by Dr. Wyatt Portela for concern of possible RD OS LEE: 09.01.20 (S. [facility]) Ocular Hx- PMH-   CURRENT MEDICATIONS: Current Outpatient Medications (Ophthalmic Drugs)  Medication Sig  . naphazoline-pheniramine (NAPHCON-A) 0.025-0.3 % ophthalmic solution Place 1 drop into both eyes 2 (two) times daily as needed for irritation or allergies (USES IN THE MORNING).   Marland Kitchen  prednisoLONE acetate (PRED FORTE) 1 % ophthalmic suspension Place 1 drop into the left eye 4 (four) times daily.   No current facility-administered medications for this visit.  (Ophthalmic Drugs)   Current Outpatient Medications (Other)  Medication Sig  . ciprofloxacin (CIPRO) 500 MG tablet Take 1 tablet (500 mg total) by mouth 2 (two) times daily.  . diclofenac (VOLTAREN) 75 MG EC tablet TAKE 1 TABLET(75 MG) BY MOUTH TWICE DAILY  . diclofenac sodium (VOLTAREN) 1 % GEL APPLY 2 GRAMS EXTERNALLY TO THE AFFECTED AREA FOUR TIMES DAILY AS NEEDED FOR PAIN  . flecainide (TAMBOCOR) 100 MG tablet Take 1 tablet (100 mg total) by mouth daily as needed. for palpitations (Patient taking differently: Take 100 mg by mouth daily as needed (palpitations). )  . gabapentin (NEURONTIN) 300 MG capsule Take at night time for 3 days then at night and in the AM for 3 days  And then TID. (Patient taking differently: Take 300 mg by mouth 3 (three) times daily. )  . HYDROcodone-acetaminophen (NORCO/VICODIN) 5-325 MG tablet Take 1 tablet by mouth every 6 (six) hours as needed for severe pain.  Marland Kitchen levocetirizine (XYZAL) 5 MG tablet Take 1 tablet (5 mg total) by mouth every evening.  . metoprolol (LOPRESSOR) 50 MG tablet Take 1 tablet (50 mg total) by mouth daily as needed. for palpitations (Patient taking differently: Take 50 mg by mouth daily as needed (palpitations). )  . metroNIDAZOLE (FLAGYL) 500 MG tablet Take 1  tablet (500 mg total) by mouth 2 (two) times daily.  . Oxycodone HCl 20 MG TABS Take 20 m by mouth every 6 (six) hours.  Marland Kitchen sulfamethoxazole-trimethoprim (BACTRIM DS,SEPTRA DS) 800-160 MG tablet Take 1 tablet by mouth 2 (two) times daily. (Patient not taking: Reported on 06/01/2019)   No current facility-administered medications for this visit.  (Other)      REVIEW OF SYSTEMS: ROS    Positive for: Cardiovascular, Eyes   Negative for: Constitutional, Gastrointestinal, Neurological, Skin, Genitourinary,  Musculoskeletal, HENT, Endocrine, Respiratory, Psychiatric, Allergic/Imm, Heme/Lymph   Last edited by Roselee Nova D, COT on 06/01/2019  1:34 PM. (History)       ALLERGIES Allergies  Allergen Reactions  . Augmentin [Amoxicillin-Pot Clavulanate] Hives    Morbilliform rash Has patient had a PCN reaction causing immediate rash, facial/tongue/throat swelling, SOB or lightheadedness with hypotension: Yes Has patient had a PCN reaction causing severe rash involving mucus membranes or skin necrosis: No Has patient had a PCN reaction that required hospitalization: No Has patient had a PCN reaction occurring within the last 10 years: No If all of the above answers are "NO", then may proceed with Cephalosporin use.     PAST MEDICAL HISTORY Past Medical History:  Diagnosis Date  . Atrial fibrillation ([facility])   . Diverticulosis   . Fatty liver 06/20/13  . Hyperplastic colon polyp   . Internal hemorrhoids   . Leukopenia   . Neck pain   . Prostate enlargement   . Spinal stenosis    Past Surgical History:  Procedure Laterality Date  . CARDIOVERSION  03/2014  . CATARACT EXTRACTION Left 05/14/2019   Dr. Wyatt Portela  . Flexible cystoscopy and repair of torn tunica albuginea    . ULNAR NERVE TRANSPOSITION Left 08/26/2017   Procedure: LEFT ELBOW ULNAR NEUROLYSIS;  Surgeon: Jessy Oto, MD;  Location: [facility];  Service: Orthopedics;  Laterality: Left;    FAMILY HISTORY Family History  Problem Relation Age of Onset  . Cancer Father   . Stroke Maternal Grandfather   . Cancer Paternal Grandmother     SOCIAL HISTORY Social History   Tobacco Use  . Smoking status: Current Every Day Smoker    Packs/day: 1.00    Years: 36.00    Pack years: 36.00    Types: Cigarettes  . Smokeless tobacco: Never Used  Substance Use Topics  . Alcohol use: Yes    Comment: social  . Drug use: No         OPHTHALMIC EXAM:  Base Eye Exam    Visual Acuity (Snellen - Linear)       Right Left   Dist [place]  20/80 -1   Dist cc 20/40 +2    Dist ph [place]  20/60 -1   Dist ph cc 20/25 -1    Correction: Contacts       Tonometry (Tonopen, 1:46 PM)      Right Left   Pressure 14 14       Pupils      Dark Light Shape React APD   Right 4 3 Round Brisk None   Left 8 8 Round None None       Visual Fields (Counting fingers)      Left Right    Full Full       Extraocular Movement      Right Left    Full, Ortho Full, Ortho       Neuro/Psych    Oriented x3: Yes  Mood/Affect: Normal       Dilation    Both eyes: 1.0% Mydriacyl, 2.5% Phenylephrine @ 1:46 PM        Slit Lamp and Fundus Exam    Slit Lamp Exam      Right Left   Lids/Lashes Mild Meibomian gland dysfunction Meibomian gland dysfunction   Conjunctiva/Sclera Mild Melanosis White and quiet   Cornea 1+ Punctate epithelial erosions, Arcus 1+ Punctate epithelial erosions, Arcus, Well healed temporal cataract wounds, small sub 72mm round scar paracentral ST quad   Anterior Chamber Deep and quiet Deep and quiet   Iris Round and dilated Round and dilated   Lens 2+ Nuclear sclerosis, 2+ Cortical cataract 3 piece PC IOL in good position   Vitreous Vitreous syneresis Vitreous syneresis, no pigment       Fundus Exam      Right Left   Disc mild tilt, +cupping, rim pink and sharp severe tilt   C/D Ratio 0.6 0.55   Macula Flat, Good foveal reflex, mild RPE mottling and clumping, No heme or edema Flat, blunted foveal reflex, posterior staphyloma flat, RPE atrophy / myopic degeneration nasal macula   Vessels Mild Vascular attenuation, mild Copper wiring Mild Vascular attenuation, mild Copper wiring   Periphery Attached, No heme  Attached, pigmented lattice from 0130-0300, scattered cystoid degeneration        Refraction    Manifest Refraction      Sphere Cylinder Axis Dist VA   Right -7.25 +1.75 138 20/25   Left -2.00 +1.25 035 20/50-2          IMAGING AND PROCEDURES  Imaging and Procedures for  @TODAY @  OCT, Retina - OU - Both Eyes       Right Eye Quality was good. Central Foveal Thickness: 269. Progression has no prior data. Findings include normal foveal contour, no IRF, no SRF, myopic contour.   Left Eye Quality was good. Central Foveal Thickness: 316. Progression has no prior data. Findings include normal foveal contour, no IRF, no SRF, myopic contour, outer retinal atrophy (Severely myopic contour / staphyloma with myopic degeneration / ORA).   Notes *Images captured and stored on drive  Diagnosis / Impression:  OD: NFP, no IRF/SRF, myopic contour OS: Severely myopic contour / posterior staphyloma with myopic degeneration / ORA; no IRF/SRF or CNVM  Clinical management:  See below  Abbreviations: NFP - Normal foveal profile. CME - cystoid macular edema. PED - pigment epithelial detachment. IRF - intraretinal fluid. SRF - subretinal fluid. EZ - ellipsoid zone. ERM - epiretinal membrane. ORA - outer retinal atrophy. ORT - outer retinal tubulation. SRHM - subretinal hyper-reflective material                 ASSESSMENT/PLAN:    ICD-10-CM   1. 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 Retinal edema  H35.81 OCT, Retina - OU - Both Eyes  6. Combined forms of age-related cataract of right eye  H25.811   7. Pseudophakia  Z96.1     1-4. Severe Myopia OU (OS > OD) OS w/ posterior staphyloma, myopic degeneration and peripheral lattice degeneration  - pigmented lattice from 0130-0300 -- no retinal holes or SRF/RD on scleral depressed exam  - discussed findings, prognosis, and treatment options including observation  - monitor for now   - f/u in 3-4 wks --  consider prophylactic laser retinopexy to patches of lattice  5. No retinal edema on exam or OCT  6. 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  - discussed diagnosis and progression  - not yet visually significant  - under the expert management of Dr. Wyatt Portela  7. Pseudophakia OS  - s/p CE/IOL (August 2020, [facility])  - beautiful surgery by Dr. Katy Fitch with IOL in perfect position--pt doing well  - monitor  Ophthalmic Meds Ordered this visit:  No orders of the defined types were placed in this encounter.      Return for f/u 3-4 weeks lattice degeneration OS.  There are no Patient Instructions on file for this visit.   Explained the diagnoses, plan, and follow up with the patient and they expressed understanding.  Patient expressed understanding of the importance of proper follow up care.   This document serves as a record of services personally performed by Gardiner Sleeper, MD, PhD. It was created on their behalf by Ernest Mallick, OA, an ophthalmic assistant. The creation of this record is the provider's dictation and/or activities during the visit.    Electronically signed by: Ernest Mallick, OA  09.01.2020 10:22 PM    Gardiner Sleeper, M.D., Ph.D. Diseases & Surgery of the Retina and Vitreous [facility]  I have reviewed the above documentation for accuracy and completeness, and I agree with the above. Gardiner Sleeper, M.D., Ph.D. 06/01/19 10:22 PM     Abbreviations: M myopia (nearsighted); A astigmatism; H hyperopia (farsighted); P presbyopia; Mrx spectacle prescription;  CTL contact lenses; OD right eye; OS left eye; OU both eyes  XT exotropia; ET esotropia; PEK punctate epithelial keratitis; PEE punctate epithelial erosions; DES dry eye syndrome; MGD meibomian gland dysfunction; ATs artificial tears; PFAT's preservative free artificial tears; [place]ison nuclear sclerotic cataract; PSC posterior subcapsular cataract; ERM epi-retinal membrane; PVD posterior vitreous detachment; RD retinal detachment; DM diabetes mellitus; DR diabetic retinopathy;  NPDR non-proliferative diabetic retinopathy; PDR proliferative diabetic retinopathy; CSME clinically significant macular edema; DME diabetic macular edema; dbh dot blot hemorrhages; CWS cotton wool spot; POAG primary open angle glaucoma; C/D cup-to-disc ratio; HVF humphrey visual field; GVF goldmann visual field; OCT optical coherence tomography; IOP intraocular pressure; BRVO Branch retinal vein occlusion; CRVO central retinal vein occlusion; CRAO central retinal artery occlusion; BRAO branch retinal artery occlusion; RT retinal tear; SB scleral buckle; PPV pars plana vitrectomy; VH Vitreous hemorrhage; PRP panretinal laser photocoagulation; IVK intravitreal kenalog; VMT vitreomacular traction; MH Macular hole;  NVD neovascularization of the disc; NVE neovascularization elsewhere; AREDS age related eye disease study; ARMD age related macular degeneration; POAG primary open angle glaucoma; EBMD epithelial/anterior basement membrane dystrophy; ACIOL anterior chamber intraocular lens; IOL intraocular lens; PCIOL posterior chamber intraocular lens; Phaco/IOL phacoemulsification with intraocular lens placement; [place] photorefractive keratectomy; LASIK laser assisted in situ keratomileusis; HTN hypertension; DM diabetes mellitus; COPD chronic obstructive pulmonary disease

## 2019-06-29 ENCOUNTER — Ambulatory Visit (INDEPENDENT_AMBULATORY_CARE_PROVIDER_SITE_OTHER): Payer: Medicaid Other | Admitting: Ophthalmology

## 2019-06-29 ENCOUNTER — Other Ambulatory Visit: Payer: Self-pay

## 2019-06-29 DIAGNOSIS — H35412 Lattice degeneration of retina, left eye: Secondary | ICD-10-CM | POA: Diagnosis not present

## 2019-06-29 DIAGNOSIS — H3581 Retinal edema: Secondary | ICD-10-CM | POA: Diagnosis not present

## 2019-06-29 DIAGNOSIS — Z961 Presence of intraocular lens: Secondary | ICD-10-CM

## 2019-06-29 DIAGNOSIS — H4422 Degenerative myopia, left eye: Secondary | ICD-10-CM | POA: Diagnosis not present

## 2019-06-29 DIAGNOSIS — H5213 Myopia, bilateral: Secondary | ICD-10-CM

## 2019-06-29 DIAGNOSIS — H25811 Combined forms of age-related cataract, right eye: Secondary | ICD-10-CM

## 2019-06-29 DIAGNOSIS — H15832 Staphyloma posticum, left eye: Secondary | ICD-10-CM

## 2019-06-29 NOTE — Progress Notes (Signed)
[facility] Note  06/29/2019     CHIEF COMPLAINT Patient presents for Retina Follow Up   HISTORY OF PRESENT ILLNESS: Gene Anderson is a 58 y.o. male who presents to the clinic today for:   HPI    Retina Follow Up    Patient presents with  Other.  In both eyes.  This started weeks ago.  Severity is moderate.  Duration of weeks.  Since onset it is stable.  I, the attending physician,  performed the HPI with the patient and updated documentation appropriately.          Comments    Patient states vision is fair but could still be improved.  Patient denies eye pain or discomfort and denies any new or worsening floaters or fol OU.       Last edited by [name], MD on 06/30/2019 12:15 AM. (History)    pt states he is doing well, his next appt with Dr. Katy Fitch is Friday  Referring physician: Nolene Ebbs, MD [place],  [place] [address]  HISTORICAL INFORMATION:   Selected notes from the MEDICAL RECORD NUMBER Referred by Dr. Wyatt Portela for concern of possible RD OS LEE: 09.01.20 (S. Groat) Ocular Hx- PMH-   CURRENT MEDICATIONS: Current Outpatient Medications (Ophthalmic Drugs)  Medication Sig  . naphazoline-pheniramine (NAPHCON-A) 0.025-0.3 % ophthalmic solution Place 1 drop into both eyes 2 (two) times daily as needed for irritation or allergies (USES IN THE MORNING).   Marland Kitchen prednisoLONE acetate (PRED FORTE) 1 % ophthalmic suspension Place 1 drop into the left eye 4 (four) times daily.   No current facility-administered medications for this visit.  (Ophthalmic Drugs)   Current Outpatient Medications (Other)  Medication Sig  . ciprofloxacin (CIPRO) 500 MG tablet Take 1 tablet (500 mg total) by mouth 2 (two) times daily.  . diclofenac (VOLTAREN) 75 MG EC tablet TAKE 1 TABLET(75 MG) BY MOUTH TWICE DAILY  . diclofenac sodium (VOLTAREN) 1 % GEL APPLY 2 GRAMS EXTERNALLY TO THE AFFECTED AREA FOUR TIMES DAILY AS NEEDED FOR PAIN  .  flecainide (TAMBOCOR) 100 MG tablet Take 1 tablet (100 mg total) by mouth daily as needed. for palpitations (Patient taking differently: Take 100 mg by mouth daily as needed (palpitations). )  . gabapentin (NEURONTIN) 300 MG capsule Take at night time for 3 days then at night and in the AM for 3 days  And then TID. (Patient taking differently: Take 300 mg by mouth 3 (three) times daily. )  . HYDROcodone-acetaminophen (NORCO/VICODIN) 5-325 MG tablet Take 1 tablet by mouth every 6 (six) hours as needed for severe pain.  Marland Kitchen levocetirizine (XYZAL) 5 MG tablet Take 1 tablet (5 mg total) by mouth every evening.  . metoprolol (LOPRESSOR) 50 MG tablet Take 1 tablet (50 mg total) by mouth daily as needed. for palpitations (Patient taking differently: Take 50 mg by mouth daily as needed (palpitations). )  . metroNIDAZOLE (FLAGYL) 500 MG tablet Take 1 tablet (500 mg total) by mouth 2 (two) times daily.  . Oxycodone HCl 20 MG TABS Take 20 m by mouth every 6 (six) hours.  Marland Kitchen sulfamethoxazole-trimethoprim (BACTRIM DS,SEPTRA DS) 800-160 MG tablet Take 1 tablet by mouth 2 (two) times daily. (Patient not taking: Reported on 06/01/2019)   No current facility-administered medications for this visit.  (Other)      REVIEW OF SYSTEMS: ROS    Positive for: Cardiovascular, Eyes   Negative for: Constitutional, Gastrointestinal, Neurological, Skin, Genitourinary, Musculoskeletal,  HENT, Endocrine, Respiratory, Psychiatric, Allergic/Imm, Heme/Lymph   Last edited by Doneen Poisson on 06/29/2019  2:11 PM. (History)       ALLERGIES Allergies  Allergen Reactions  . Augmentin [Amoxicillin-Pot Clavulanate] Hives    Morbilliform rash Has patient had a PCN reaction causing immediate rash, facial/tongue/throat swelling, SOB or lightheadedness with hypotension: Yes Has patient had a PCN reaction causing severe rash involving mucus membranes or skin necrosis: No Has patient had a PCN reaction that required hospitalization:  No Has patient had a PCN reaction occurring within the last 10 years: No If all of the above answers are "NO", then may proceed with Cephalosporin use.     PAST MEDICAL HISTORY Past Medical History:  Diagnosis Date  . Atrial fibrillation ([place])   . Diverticulosis   . Fatty liver 06/20/13  . Hyperplastic colon polyp   . Internal hemorrhoids   . Leukopenia   . Neck pain   . Prostate enlargement   . Spinal stenosis    Past Surgical History:  Procedure Laterality Date  . CARDIOVERSION  03/2014  . CATARACT EXTRACTION Left 05/14/2019   Dr. Wyatt Portela  . Flexible cystoscopy and repair of torn tunica albuginea    . ULNAR NERVE TRANSPOSITION Left 08/26/2017   Procedure: LEFT ELBOW ULNAR NEUROLYSIS;  Surgeon: Jessy Oto, MD;  Location: [facility];  Service: Orthopedics;  Laterality: Left;    FAMILY HISTORY Family History  Problem Relation Age of Onset  . Cancer Father   . Stroke Maternal Grandfather   . Cancer Paternal Grandmother     SOCIAL HISTORY Social History   Tobacco Use  . Smoking status: Current Every Day Smoker    Packs/day: 1.00    Years: 36.00    Pack years: 36.00    Types: Cigarettes  . Smokeless tobacco: Never Used  Substance Use Topics  . Alcohol use: Yes    Comment: social  . Drug use: No         OPHTHALMIC EXAM:  Base Eye Exam    Visual Acuity (Snellen - Linear)      Right Left   Dist [place]  20/70 -2   Dist cc 20/30 -1    Dist ph [place]  20/40 -2   Dist ph cc 20/30 +2        Tonometry (Tonopen, 2:15 PM)      Right Left   Pressure 13 14       Pupils      Dark Light Shape React APD   Right 3 2 Round Minimal 0   Left 3 2 Round Minimal 0       Visual Fields      Left Right    Full Full       Extraocular Movement      Right Left    Full Full       Neuro/Psych    Oriented x3: Yes   Mood/Affect: Normal       Dilation    Both eyes: 1.0% Mydriacyl, 2.5% Phenylephrine @ 2:16 PM        Slit Lamp and Fundus Exam     Slit Lamp Exam      Right Left   Lids/Lashes Mild Meibomian gland dysfunction Meibomian gland dysfunction   Conjunctiva/Sclera Mild Melanosis White and quiet   Cornea 1+ Punctate epithelial erosions, Arcus, Debris in tear film 1+ Punctate epithelial erosions, Arcus, Well healed temporal cataract wounds, small sub 91mm round scar paracentral ST quad  Anterior Chamber Deep and quiet Deep and quiet   Iris Round and dilated Round and moderately dilated to 44mm   Lens 2+ Nuclear sclerosis, 2+ Cortical cataract 3 piece PC IOL in good position   Vitreous Vitreous syneresis Vitreous syneresis, no pigment, vitreous condensations inferiorly       Fundus Exam      Right Left   Disc mild tilt, +cupping, rim pink and sharp severe tilt, temporal Peripapillary atrophy, PPP   C/D Ratio 0.6 0.55   Macula Flat, Good foveal reflex, mild RPE mottling and clumping, No heme or edema Flat, blunted foveal reflex, posterior staphyloma flat, RPE atrophy / myopic degeneration nasal macula   Vessels Mild Vascular attenuation, mild Copper wiring Mild Vascular attenuation, Tortuous, AV crossing changes   Periphery Attached, No heme  Attached, pigmented lattice from 0130-0300, scattered cystoid degeneration          IMAGING AND PROCEDURES  Imaging and Procedures for @TODAY @  OCT, Retina - OU - Both Eyes       Right Eye Quality was good. Central Foveal Thickness: 267. Progression has been stable. Findings include normal foveal contour, no IRF, no SRF, myopic contour.   Left Eye Quality was good. Central Foveal Thickness: 288. Progression has been stable. Findings include normal foveal contour, no IRF, no SRF, myopic contour, outer retinal atrophy (Severely myopic contour / staphyloma with myopic degeneration / ORA).   Notes *Images captured and stored on drive  Diagnosis / Impression:  OD: NFP, no IRF/SRF, myopic contour OS: Severely myopic contour / posterior staphyloma with myopic degeneration / ORA; no  IRF/SRF or CNVM -- stable  Clinical management:  See below  Abbreviations: NFP - Normal foveal profile. CME - cystoid macular edema. PED - pigment epithelial detachment. IRF - intraretinal fluid. SRF - subretinal fluid. EZ - ellipsoid zone. ERM - epiretinal membrane. ORA - outer retinal atrophy. ORT - outer retinal tubulation. SRHM - subretinal hyper-reflective material                 ASSESSMENT/PLAN:    ICD-10-CM   1. 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 Retinal edema  H35.81 OCT, Retina - OU - Both Eyes  6. Combined forms of age-related cataract of right eye  H25.811   7. Pseudophakia  Z96.1     1-4. Severe Myopia OU (OS > OD) OS w/ posterior staphyloma, myopic degeneration and peripheral lattice degeneration  - pigmented lattice from 0130-0300 -- no retinal holes or SRF/RD on scleral depressed exam  - discussed findings, prognosis, and treatment options including observation  - recommend laser retinopexy OS today, 09.29.20  - pt wishes to come back tomorrow for laser  - f/u tomorrow at 215, laser retinopexy OS -- dilate OS only  5. No retinal edema on exam or OCT  6. 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  - discussed diagnosis and progression  - not yet visually significant  - under the expert management of Dr. Wyatt Portela  7. Pseudophakia OS  - s/p CE/IOL (August 2020, S. Groat)  - beautiful surgery by Dr. Katy Fitch with IOL in perfect position--pt doing well  - monitor  Ophthalmic Meds Ordered this visit:  No orders of the defined types were placed in this encounter.      Return in about 1 day (around 06/30/2019) for Laser OS.  There are no Patient Instructions on file for this  visit.   Explained the diagnoses, plan, and follow up with the patient and they expressed understanding.  Patient expressed understanding of the importance of proper follow up care.   This document serves as a record of services personally performed by Gardiner Sleeper, MD, PhD. It was created on their behalf by Ernest Mallick, OA, an ophthalmic assistant. The creation of this record is the provider's dictation and/or activities during the visit.    Electronically signed by: Ernest Mallick, OA  09.29.2020 12:30 AM     Gardiner Sleeper, M.D., Ph.D. Diseases & Surgery of the Retina and Vitreous [facility]   I have reviewed the above documentation for accuracy and completeness, and I agree with the above. Gardiner Sleeper, M.D., Ph.D. 06/30/19 12:32 AM     Abbreviations: M myopia (nearsighted); A astigmatism; H hyperopia (farsighted); P presbyopia; Mrx spectacle prescription;  CTL contact lenses; OD right eye; OS left eye; OU both eyes  XT exotropia; ET esotropia; PEK punctate epithelial keratitis; PEE punctate epithelial erosions; DES dry eye syndrome; MGD meibomian gland dysfunction; ATs artificial tears; PFAT's preservative free artificial tears; [place] nuclear sclerotic cataract; PSC posterior subcapsular cataract; ERM epi-retinal membrane; PVD posterior vitreous detachment; RD retinal detachment; DM diabetes mellitus; DR diabetic retinopathy; NPDR non-proliferative diabetic retinopathy; PDR proliferative diabetic retinopathy; CSME clinically significant macular edema; DME diabetic macular edema; dbh dot blot hemorrhages; CWS cotton wool spot; POAG primary open angle glaucoma; C/D cup-to-disc ratio; HVF humphrey visual field; GVF goldmann visual field; OCT optical coherence tomography; IOP intraocular pressure; BRVO Branch retinal vein occlusion; CRVO central retinal vein occlusion; CRAO central retinal artery occlusion; BRAO branch retinal artery occlusion; RT retinal tear; SB  scleral buckle; PPV pars plana vitrectomy; VH Vitreous hemorrhage; PRP panretinal laser photocoagulation; IVK intravitreal kenalog; VMT vitreomacular traction; MH Macular hole;  NVD neovascularization of the disc; NVE neovascularization elsewhere; AREDS age related eye disease study; ARMD age related macular degeneration; POAG primary open angle glaucoma; EBMD epithelial/anterior basement membrane dystrophy; ACIOL anterior chamber intraocular lens; IOL intraocular lens; PCIOL posterior chamber intraocular lens; Phaco/IOL phacoemulsification with intraocular lens placement; [place] photorefractive keratectomy; LASIK laser assisted in situ keratomileusis; HTN hypertension; DM diabetes mellitus; COPD chronic obstructive pulmonary disease

## 2019-06-30 ENCOUNTER — Encounter (INDEPENDENT_AMBULATORY_CARE_PROVIDER_SITE_OTHER): Payer: Self-pay | Admitting: Ophthalmology

## 2019-06-30 ENCOUNTER — Ambulatory Visit (INDEPENDENT_AMBULATORY_CARE_PROVIDER_SITE_OTHER): Payer: Medicaid Other | Admitting: Ophthalmology

## 2019-06-30 DIAGNOSIS — H25811 Combined forms of age-related cataract, right eye: Secondary | ICD-10-CM

## 2019-06-30 DIAGNOSIS — Z961 Presence of intraocular lens: Secondary | ICD-10-CM

## 2019-06-30 DIAGNOSIS — H15832 Staphyloma posticum, left eye: Secondary | ICD-10-CM

## 2019-06-30 DIAGNOSIS — H4422 Degenerative myopia, left eye: Secondary | ICD-10-CM

## 2019-06-30 DIAGNOSIS — H3581 Retinal edema: Secondary | ICD-10-CM

## 2019-06-30 DIAGNOSIS — H5213 Myopia, bilateral: Secondary | ICD-10-CM

## 2019-06-30 DIAGNOSIS — H35412 Lattice degeneration of retina, left eye: Secondary | ICD-10-CM

## 2019-06-30 NOTE — Progress Notes (Signed)
[facility] Note  06/30/2019     CHIEF COMPLAINT Patient presents for Retina Follow Up   HISTORY OF PRESENT ILLNESS: Gene RAPIER is a 58 y.o. male who presents to the clinic today for:   HPI    Retina Follow Up    Patient presents with  Other.  In left eye.  This started weeks ago.  Severity is moderate.  Duration of 1 day.  Since onset it is stable.  I, the attending physician,  performed the HPI with the patient and updated documentation appropriately.          Comments    58 y/o male pt returning after 1 day for laser retinopexy OS.  No change in [place] OU.  Denies pain, flashes, floaters.  No gtts.  Ready for procedure.       Last edited by [name], MD on 06/30/2019  3:56 PM. (History)    pt   Referring physician: Debbra Riding, MD [address] [place],  [place] [address]  HISTORICAL INFORMATION:   Selected notes from the MEDICAL RECORD NUMBER Referred by Dr. Wyatt Portela for concern of possible RD OS LEE: 09.01.20 (S. Groat) Ocular Hx- PMH-   CURRENT MEDICATIONS: Current Outpatient Medications (Ophthalmic Drugs)  Medication Sig  . naphazoline-pheniramine (NAPHCON-A) 0.025-0.3 % ophthalmic solution Place 1 drop into both eyes 2 (two) times daily as needed for irritation or allergies (USES IN THE MORNING).   Marland Kitchen prednisoLONE acetate (PRED FORTE) 1 % ophthalmic suspension Place 1 drop into the left eye 4 (four) times daily.   No current facility-administered medications for this visit.  (Ophthalmic Drugs)   Current Outpatient Medications (Other)  Medication Sig  . ciprofloxacin (CIPRO) 500 MG tablet Take 1 tablet (500 mg total) by mouth 2 (two) times daily.  . diclofenac (VOLTAREN) 75 MG EC tablet TAKE 1 TABLET(75 MG) BY MOUTH TWICE DAILY  . diclofenac sodium (VOLTAREN) 1 % GEL APPLY 2 GRAMS EXTERNALLY TO THE AFFECTED AREA FOUR TIMES DAILY AS NEEDED FOR PAIN  . flecainide (TAMBOCOR) 100 MG tablet Take 1 tablet (100 mg  total) by mouth daily as needed. for palpitations (Patient taking differently: Take 100 mg by mouth daily as needed (palpitations). )  . gabapentin (NEURONTIN) 300 MG capsule Take at night time for 3 days then at night and in the AM for 3 days  And then TID. (Patient taking differently: Take 300 mg by mouth 3 (three) times daily. )  . HYDROcodone-acetaminophen (NORCO/VICODIN) 5-325 MG tablet Take 1 tablet by mouth every 6 (six) hours as needed for severe pain.  Marland Kitchen levocetirizine (XYZAL) 5 MG tablet Take 1 tablet (5 mg total) by mouth every evening.  . metoprolol (LOPRESSOR) 50 MG tablet Take 1 tablet (50 mg total) by mouth daily as needed. for palpitations (Patient taking differently: Take 50 mg by mouth daily as needed (palpitations). )  . metroNIDAZOLE (FLAGYL) 500 MG tablet Take 1 tablet (500 mg total) by mouth 2 (two) times daily.  . Oxycodone HCl 20 MG TABS Take 20 m by mouth every 6 (six) hours.  Marland Kitchen sulfamethoxazole-trimethoprim (BACTRIM DS,SEPTRA DS) 800-160 MG tablet Take 1 tablet by mouth 2 (two) times daily. (Patient not taking: Reported on 06/01/2019)   No current facility-administered medications for this visit.  (Other)      REVIEW OF SYSTEMS: ROS    Positive for: Musculoskeletal, Cardiovascular, Eyes   Negative for: Constitutional, Gastrointestinal, Neurological, Skin, Genitourinary, HENT, Endocrine, Respiratory,  Psychiatric, Allergic/Imm, Heme/Lymph   Last edited by Matthew Folks, COA on 06/30/2019  2:34 PM. (History)       ALLERGIES Allergies  Allergen Reactions  . Augmentin [Amoxicillin-Pot Clavulanate] Hives    Morbilliform rash Has patient had a PCN reaction causing immediate rash, facial/tongue/throat swelling, SOB or lightheadedness with hypotension: Yes Has patient had a PCN reaction causing severe rash involving mucus membranes or skin necrosis: No Has patient had a PCN reaction that required hospitalization: No Has patient had a PCN reaction occurring within the  last 10 years: No If all of the above answers are "NO", then may proceed with Cephalosporin use.     PAST MEDICAL HISTORY Past Medical History:  Diagnosis Date  . Atrial fibrillation ([place])   . Cataract    OD  . Diverticulosis   . Fatty liver 06/20/13  . Hyperplastic colon polyp   . Internal hemorrhoids   . Leukopenia   . Neck pain   . Prostate enlargement   . Spinal stenosis    Past Surgical History:  Procedure Laterality Date  . CARDIOVERSION  03/2014  . CATARACT EXTRACTION Left 05/14/2019   Dr. Wyatt Portela  . EYE SURGERY    . Flexible cystoscopy and repair of torn tunica albuginea    . ULNAR NERVE TRANSPOSITION Left 08/26/2017   Procedure: LEFT ELBOW ULNAR NEUROLYSIS;  Surgeon: Jessy Oto, MD;  Location: [facility];  Service: Orthopedics;  Laterality: Left;    FAMILY HISTORY Family History  Problem Relation Age of Onset  . Cancer Father   . Stroke Maternal Grandfather   . Cancer Paternal Grandmother     SOCIAL HISTORY Social History   Tobacco Use  . Smoking status: Current Every Day Smoker    Packs/day: 1.00    Years: 36.00    Pack years: 36.00    Types: Cigarettes  . Smokeless tobacco: Never Used  Substance Use Topics  . Alcohol use: Yes    Comment: social  . Drug use: No         OPHTHALMIC EXAM:  Base Eye Exam    Visual Acuity (Snellen - Linear)      Right Left   Dist [place]  20/80 -2   Dist cc 20/30    Dist ph [place]  20/60 -2   Dist ph cc 20/30 +2    Correction: Contacts  CL OD       Tonometry (Tonopen, 2:36 PM)      Right Left   Pressure 13 13       Pupils      Dark Light Shape React APD   Right 3 2 Round Minimal None   Left 3 2 Round Minimal None       Visual Fields (Counting fingers)      Left Right    Full Full       Extraocular Movement      Right Left    Full, Ortho Full, Ortho       Neuro/Psych    Oriented x3: Yes   Mood/Affect: Normal       Dilation    Left eye: 1.0% Mydriacyl, 2.5% Phenylephrine  @ 2:36 PM        Slit Lamp and Fundus Exam    Slit Lamp Exam      Right Left   Lids/Lashes Mild Meibomian gland dysfunction Meibomian gland dysfunction   Conjunctiva/Sclera Mild Melanosis White and quiet   Cornea 1+ Punctate epithelial erosions, Arcus, Debris in  tear film 1+ Punctate epithelial erosions, Arcus, Well healed temporal cataract wounds, small sub 65mm round scar paracentral ST quad   Anterior Chamber Deep and quiet Deep and quiet   Iris Round and dilated Round and moderately dilated to 23mm   Lens 2+ Nuclear sclerosis, 2+ Cortical cataract 3 piece PC IOL in good position   Vitreous Vitreous syneresis Vitreous syneresis, no pigment, vitreous condensations inferiorly       Fundus Exam      Right Left   Disc mild tilt, +cupping, rim pink and sharp severe tilt, temporal Peripapillary atrophy, PPP   C/D Ratio 0.6 0.55   Macula Flat, Good foveal reflex, mild RPE mottling and clumping, No heme or edema Flat, blunted foveal reflex, posterior staphyloma flat, RPE atrophy / myopic degeneration nasal macula   Vessels Mild Vascular attenuation, mild Copper wiring Mild Vascular attenuation, Tortuous, AV crossing changes   Periphery Attached, No heme  Attached, pigmented lattice from 0130-0300, scattered cystoid degeneration          IMAGING AND PROCEDURES  Imaging and Procedures for @TODAY @  Repair Retinal Breaks, Laser - OS - Left Eye       LASER PROCEDURE NOTE  Procedure:  Barrier laser retinopexy using slit lamp laser, LEFT eye   Diagnosis:   Lattice degeneration, LEFT eye                     Pigmented peripheral lattice: 0130-0300  Surgeon: [name], MD, PhD  Anesthesia: Topical  Informed consent obtained, operative eye marked, and time out performed prior to initiation of laser.   Laser settings:  Lumenis Smart532 laser, slit lamp Lens: [place] PRP 165 Power: 250 mW Spot size: 200 microns Duration: 30 msec  # spots: 222  Placement of laser: Using a  [place] PRP 165 contact lens at the slit lamp, laser was placed in three confluent rows around patches of lattice in superotemporal periphery. Indirect laser ophthlamoscopy was used to complete the anterior laser to the ora serrata:  69 spots, 250 mW power, 50 ms duration.  Complications: None.  Patient tolerated the procedure well and received written and verbal post-procedure care information/education.                ASSESSMENT/PLAN:    ICD-10-CM   1. 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 Retinal edema  H35.81   6. Combined forms of age-related cataract of right eye  H25.811   7. Pseudophakia  Z96.1     1-4. Severe Myopia OU (OS > OD) OS w/ posterior staphyloma, myopic degeneration and peripheral lattice degeneration  - pigmented lattice from 0130-0300 -- no retinal holes or SRF/RD on scleral depressed exam  - discussed findings, prognosis, and treatment options including observation  - recommend prophylactic laser retinopexy OS today, 09.30.20  - pt wishes to proceed  - RBA of procedure discussed, questions answered  - informed consent obtained and signed  - see procedure note  - start PF QID OS x7d  - f/u 2-3 wks for POV  5. No retinal edema on exam or OCT  6. Mixed form age related cataract OD  - under the expert management of Dr. Wyatt Portela  - scheduled for CE/IOL on Friday, 10.2.2020  7. Pseudophakia OS  -  s/p CE/IOL (August 2020, S. Gro[facility])  - beautiful surgery by Dr. Katy Fitch with IOL in perfect position--pt doing well  - monitor  Ophthalmic Meds Ordered this visit:  No orders of the defined types were placed in this encounter.      Return for 2-3 wks, POV s/p laser retinopexy OS.  There are no Patient Instructions on file for this visit.   Explained the diagnoses, plan, and  follow up with the patient and they expressed understanding.  Patient expressed understanding of the importance of proper follow up care.   This document serves as a record of services personally performed by Gardiner Sleeper, MD, PhD. It was created on their behalf by Ernest Mallick, OA, an ophthalmic assistant. The creation of this record is the provider's dictation and/or activities during the visit.    Electronically signed by: Ernest Mallick, OA 09.30.2020 5:20 PM    Gardiner Sleeper, M.D., Ph.D. Diseases & Surgery of the Retina and Vitreous [facility]  I have reviewed the above documentation for accuracy and completeness, and I agree with the above. Gardiner Sleeper, M.D., Ph.D. 06/30/19 5:20 PM    Abbreviations: M myopia (nearsighted); A astigmatism; H hyperopia (farsighted); P presbyopia; Mrx spectacle prescription;  CTL contact lenses; OD right eye; OS left eye; OU both eyes  XT exotropia; ET esotropia; PEK punctate epithelial keratitis; PEE punctate epithelial erosions; DES dry eye syndrome; MGD meibomian gland dysfunction; ATs artificial tears; PFAT's preservative free artificial tears; [place] nuclear sclerotic cataract; PSC posterior subcapsular cataract; ERM epi-retinal membrane; PVD posterior vitreous detachment; RD retinal detachment; DM diabetes mellitus; DR diabetic retinopathy; NPDR non-proliferative diabetic retinopathy; PDR proliferative diabetic retinopathy; CSME clinically significant macular edema; DME diabetic macular edema; dbh dot blot hemorrhages; CWS cotton wool spot; POAG primary open angle glaucoma; C/D cup-to-disc ratio; HVF humphrey visual field; GVF goldmann visual field; OCT optical coherence tomography; IOP intraocular pressure; BRVO Branch retinal vein occlusion; CRVO central retinal vein occlusion; CRAO central retinal artery occlusion; BRAO branch retinal artery occlusion; RT retinal tear; SB scleral buckle; PPV pars plana vitrectomy; VH Vitreous  hemorrhage; PRP panretinal laser photocoagulation; IVK intravitreal kenalog; VMT vitreomacular traction; MH Macular hole;  NVD neovascularization of the disc; NVE neovascularization elsewhere; AREDS age related eye disease study; ARMD age related macular degeneration; POAG primary open angle glaucoma; EBMD epithelial/anterior basement membrane dystrophy; ACIOL anterior chamber intraocular lens; IOL intraocular lens; PCIOL posterior chamber intraocular lens; Phaco/IOL phacoemulsification with intraocular lens placement; [place] photorefractive keratectomy; LASIK laser assisted in situ keratomileusis; HTN hypertension; DM diabetes mellitus; COPD chronic obstructive pulmonary disease

## 2019-07-08 ENCOUNTER — Other Ambulatory Visit: Payer: Self-pay

## 2019-07-08 ENCOUNTER — Ambulatory Visit
Admission: EM | Admit: 2019-07-08 | Discharge: 2019-07-08 | Disposition: A | Discharge status: Home / Self Care | Payer: Medicaid Other | Attending: Physician Assistant | Admitting: Physician Assistant

## 2019-07-08 ENCOUNTER — Encounter: Payer: Self-pay | Admitting: Emergency Medicine

## 2019-07-08 DIAGNOSIS — Z20822 Contact with and (suspected) exposure to covid-19: Secondary | ICD-10-CM

## 2019-07-08 DIAGNOSIS — Z20828 Contact with and (suspected) exposure to other viral communicable diseases: Secondary | ICD-10-CM

## 2019-07-08 NOTE — ED Provider Notes (Signed)
[facility]    CSN: [id number] Arrival date & time: 07/08/19  1406      History   Chief Complaint Chief Complaint  Patient presents with  . COVID Exposure    HPI Gene Anderson is a 58 y.o. male.   58 year old male comes in for testing after positive exposure of COVID 2 days ago.  He is asymptomatic.  Denies cough, congestion, sore throat.  Denies fever, chills, body aches.  Denies abdominal pain, nausea, vomiting, diarrhea.  Denies shortness of breath, loss of taste or smell.  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  He was standing within 6 feet of the client, and cannot recall if client was wearing masks.  He feels that he had been in contact with client for at least 15 minutes.     Past Medical History:  Diagnosis Date  . Atrial fibrillation ([place])   . Cataract    OD  . Diverticulosis   . Fatty liver 06/20/13  . Hyperplastic colon polyp   . Internal hemorrhoids   . Leukopenia   . Neck pain   . Prostate enlargement   . Spinal stenosis     Patient Active Problem List   Diagnosis Date Noted  . 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 Atrial fibrillation ([place]) 04/18/2014  . Paresthesia 02/16/2014  . Neck pain 02/16/2014  . Leukopenia 02/16/2013    Past Surgical History:  Procedure Laterality Date  . CARDIOVERSION  03/2014  . CATARACT EXTRACTION Left 05/14/2019   Dr. Wyatt Portela  . EYE SURGERY    . Flexible cystoscopy and repair of torn tunica albuginea    . ULNAR NERVE TRANSPOSITION Left 08/26/2017   Procedure: LEFT ELBOW ULNAR NEUROLYSIS;  Surgeon: Jessy Oto, MD;  Location: [facility];  Service: Orthopedics;  Laterality: Left;       Home Medications    Prior to Admission medications   Medication Sig Start Date End Date Taking? Authorizing Provider  naphazoline-pheniramine (NAPHCON-A) 0.025-0.3 % ophthalmic solution Place 1 drop into both eyes  2 (two) times daily as needed for irritation or allergies (USES IN THE MORNING).    Yes [provider]  diclofenac (VOLTAREN) 75 MG EC tablet TAKE 1 TABLET(75 MG) BY MOUTH TWICE DAILY 02/08/19   Jessy Oto, MD  diclofenac sodium (VOLTAREN) 1 % GEL APPLY 2 GRAMS EXTERNALLY TO THE AFFECTED AREA FOUR TIMES DAILY AS NEEDED FOR PAIN 02/08/19   Jessy Oto, MD  Oxycodone HCl 20 MG TABS Take 20 m by mouth every 6 (six) hours. 05/25/18   [provider]  flecainide (TAMBOCOR) 100 MG tablet Take 1 tablet (100 mg total) by mouth daily as needed. for palpitations Patient taking differently: Take 100 mg by mouth daily as needed (palpitations).  04/04/16 07/08/19  Evans Lance, MD  gabapentin (NEURONTIN) 300 MG capsule Take at night time for 3 days then at night and in the AM for 3 days  And then TID. Patient taking differently: Take 300 mg by mouth 3 (three) times daily.  12/09/17 07/08/19  Jessy Oto, MD  levocetirizine (XYZAL) 5 MG tablet Take 1 tablet (5 mg total) by mouth every evening. 10/05/17 07/08/19  [name], PA-C  metoprolol (LOPRESSOR) 50 MG tablet Take 1 tablet (50 mg total) by mouth daily as needed. for palpitations Patient taking differently: Take 50 mg  by mouth daily as needed (palpitations).  04/04/16 07/08/19  Evans Lance, MD    Family History Family History  Problem Relation Age of Onset  . Cancer Father   . Stroke Maternal Grandfather   . Cancer Paternal Grandmother     Social History Social History   Tobacco Use  . Smoking status: Current Every Day Smoker    Packs/day: 0.75    Years: 36.00    Pack years: 27.00    Types: Cigarettes  . Smokeless tobacco: Never Used  Substance Use Topics  . Alcohol use: Yes    Comment: social  . Drug use: No     Allergies   Augmentin [amoxicillin-pot clavulanate]   Review of Systems Review of Systems  Reason unable to perform ROS: See HPI as above.     Physical Exam Triage Vital Signs ED Triage  Vitals [07/08/19 1417]  Enc Vitals Group     BP (!) 163/96     Pulse Rate 76     Resp 18     Temp 98.2 F (36.8 C)     Temp Source Oral     SpO2 96 %     Weight      Height      Head Circumference      Peak Flow      Pain Score 0     Pain Loc      Pain Edu?      Excl. in [place]?    No data found.  Updated Vital Signs BP (!) 163/96 (BP Location: Left Arm)   Pulse 76   Temp 98.2 F (36.8 C) (Oral)   Resp 18   SpO2 96%   Physical Exam Constitutional:      General: He is not in acute distress.    Appearance: Normal appearance. He is well-developed. He is not ill-appearing, toxic-appearing or diaphoretic.  HENT:     Head: Normocephalic and atraumatic.  Eyes:     Conjunctiva/sclera: Conjunctivae normal.     Pupils: Pupils are equal, round, and reactive to light.  Neck:     Musculoskeletal: Normal range of motion and neck supple.  Pulmonary:     Effort: Pulmonary effort is normal. No respiratory distress.  Neurological:     Mental Status: He is alert and oriented to person, place, and time.      UC Treatments / Results  Labs (all labs ordered are listed, but only abnormal results are displayed) Labs Reviewed - No data to display  EKG   Radiology No results found.  Procedures Procedures (including critical care time)  Medications Ordered in UC Medications - No data to display  Initial Impression / Assessment and Plan / UC Course  I have reviewed the triage vital signs and the nursing notes.  Pertinent labs & imaging results that were available during my care of the patient were reviewed by me and considered in my medical decision making (see chart for details).    Discussed with patient, per CDC recommendation, COVID testing for exposure should occur at least 5 days post exposure.  Patient expresses understanding, and will wait till then for testing.  Will order drive-through test for patient to stop by testing site 07/11/2019.  Patient able to work in own  station without being in close proximity to client or coworkers (at least 6 ft).  Patient understands that he will need to wear a mask during this time, with frequent handwashing.  Patient understands that negative COVID test will  still require monitoring and precautions until at least 14 days since exposure.  Patient to quarantine if develop symptoms, and will need retesting.  Return precautions given.  Patient expresses understanding and agrees to plan.  Final Clinical Impressions(s) / UC Diagnoses   Final diagnoses:  Exposure to COVID-19 virus   ED Prescriptions    None     PDMP not reviewed this encounter.   Ok Edwards, PA-C 07/08/19 1712

## 2019-07-08 NOTE — ED Triage Notes (Signed)
PT presents to [facility] for assessment after being exposed to a positive COVID person at his place of employment on Tuesday.  Patient made aware that CDC recommendation is 5 days after exposure, patient wanting to speak to provider today about care and returning to work, and will return Sunday for testing

## 2019-07-08 NOTE — Discharge Instructions (Addendum)
COVID testing ordered for 07/11/2019. Please go to [facility] 8am-3pm for swabbing. As discussed, given recent exposure without symptoms, you may still be in incubation period. Please continue to keep distance with coworkers, and may need others for client interactions. Monitor for any symptoms such as cough, congestion, shortness of breath, loss of taste/smell, fever, to start self quarantine and may need retesting. Go to the emergency department for further evaluation if you develop significant shortness of breath, cannot speak in full sentences.

## 2019-07-08 NOTE — ED Notes (Signed)
Patient able to ambulate independently  

## 2019-07-11 ENCOUNTER — Ambulatory Visit
Admission: EM | Admit: 2019-07-11 | Discharge: 2019-07-11 | Disposition: A | Discharge status: Home / Self Care | Payer: Medicaid Other | Attending: Emergency Medicine | Admitting: Emergency Medicine

## 2019-07-11 ENCOUNTER — Other Ambulatory Visit: Payer: Self-pay

## 2019-07-11 DIAGNOSIS — Z20828 Contact with and (suspected) exposure to other viral communicable diseases: Secondary | ICD-10-CM

## 2019-07-11 NOTE — ED Triage Notes (Signed)
Pt here for COVID testing d/t [facility] is closed. Nurse visit completed per provider.

## 2019-07-12 LAB — NOVEL CORONAVIRUS, NAA: SARS-CoV-2, NAA: NOT DETECTED

## 2019-07-16 ENCOUNTER — Encounter (INDEPENDENT_AMBULATORY_CARE_PROVIDER_SITE_OTHER): Payer: Medicaid Other | Admitting: Ophthalmology

## 2019-07-19 ENCOUNTER — Ambulatory Visit (INDEPENDENT_AMBULATORY_CARE_PROVIDER_SITE_OTHER): Payer: Medicaid Other | Admitting: Ophthalmology

## 2019-07-19 ENCOUNTER — Other Ambulatory Visit: Payer: Self-pay

## 2019-07-19 ENCOUNTER — Encounter (INDEPENDENT_AMBULATORY_CARE_PROVIDER_SITE_OTHER): Payer: Self-pay | Admitting: Ophthalmology

## 2019-07-19 DIAGNOSIS — Z961 Presence of intraocular lens: Secondary | ICD-10-CM

## 2019-07-19 DIAGNOSIS — H3581 Retinal edema: Secondary | ICD-10-CM

## 2019-07-19 DIAGNOSIS — H15832 Staphyloma posticum, left eye: Secondary | ICD-10-CM

## 2019-07-19 DIAGNOSIS — H4422 Degenerative myopia, left eye: Secondary | ICD-10-CM

## 2019-07-19 DIAGNOSIS — H5213 Myopia, bilateral: Secondary | ICD-10-CM

## 2019-07-19 DIAGNOSIS — H35412 Lattice degeneration of retina, left eye: Secondary | ICD-10-CM

## 2019-07-19 NOTE — Progress Notes (Signed)
[facility] Note  07/19/2019     CHIEF COMPLAINT Patient presents for Retina Follow Up   HISTORY OF PRESENT ILLNESS: Gene Anderson is a 58 y.o. male who presents to the clinic today for:   HPI    Retina Follow Up    Patient presents with  Other.  In left eye.  This started 2 months ago.  Severity is moderate.  Duration of 3 weeks.  Since onset it is gradually improving.  I, the attending physician,  performed the HPI with the patient and updated documentation appropriately.          Comments    58 y/o male pt here for 3 wk f/u s/p prophylactic laser retinopexy OS 9.30.20.  No change in [place] OU noticed, but OS still gets a bit cloudy at night.  Denies pain, flashes, floaters.  PF QID OS, Lumify QD OU.       Last edited by Gene Caffey, MD on 07/19/2019  1:28 PM. (History)    pt states he recently had cataract sx in his right eye, he states he is still using PF in his left eye QID, he states he is also using PF in his right eye QID since cataract sx, as well as Lumify  Referring physician: Debbra Riding, MD [address] [place],  [place] [address]  HISTORICAL INFORMATION:   Selected notes from the MEDICAL RECORD NUMBER Referred by Dr. Wyatt Anderson for concern of possible RD OS LEE: 09.01.20 (Gene Anderson) Ocular Hx- PMH-   CURRENT MEDICATIONS: Current Outpatient Medications (Ophthalmic Drugs)  Medication Sig  . naphazoline-pheniramine (NAPHCON-A) 0.025-0.3 % ophthalmic solution Place 1 drop into both eyes 2 (two) times daily as needed for irritation or allergies (USES IN THE MORNING).    No current facility-administered medications for this visit.  (Ophthalmic Drugs)   Current Outpatient Medications (Other)  Medication Sig  . diclofenac (VOLTAREN) 75 MG EC tablet TAKE 1 TABLET(75 MG) BY MOUTH TWICE DAILY  . diclofenac sodium (VOLTAREN) 1 % GEL APPLY 2 GRAMS EXTERNALLY TO THE AFFECTED AREA FOUR TIMES DAILY AS NEEDED FOR PAIN  .  Oxycodone HCl 20 MG TABS Take 20 m by mouth every 6 (six) hours.   No current facility-administered medications for this visit.  (Other)      REVIEW OF SYSTEMS: ROS    Positive for: Musculoskeletal, Cardiovascular, Eyes   Negative for: Constitutional, Gastrointestinal, Neurological, Skin, Genitourinary, HENT, Endocrine, Respiratory, Psychiatric, Allergic/Imm, Heme/Lymph   Last edited by Gene Anderson, COA on 07/19/2019  1:14 PM. (History)       ALLERGIES Allergies  Allergen Reactions  . Augmentin [Amoxicillin-Pot Clavulanate] Hives    Morbilliform rash Has patient had a PCN reaction causing immediate rash, facial/tongue/throat swelling, SOB or lightheadedness with hypotension: Yes Has patient had a PCN reaction causing severe rash involving mucus membranes or skin necrosis: No Has patient had a PCN reaction that required hospitalization: No Has patient had a PCN reaction occurring within the last 10 years: No If all of the above answers are "NO", then may proceed with Cephalosporin use.     PAST MEDICAL HISTORY Past Medical History:  Diagnosis Date  . Atrial fibrillation ([place])   . Cataract    OD  . Diverticulosis   . Fatty liver 06/20/13  . Hyperplastic colon polyp   . Internal hemorrhoids   . Leukopenia   . Neck pain   . Prostate enlargement   . Spinal  stenosis    Past Surgical History:  Procedure Laterality Date  . CARDIOVERSION  03/2014  . CATARACT EXTRACTION Left 05/14/2019   Dr. Wyatt Anderson  . EYE SURGERY    . Flexible cystoscopy and repair of torn tunica albuginea    . ULNAR NERVE TRANSPOSITION Left 08/26/2017   Procedure: LEFT ELBOW ULNAR NEUROLYSIS;  Surgeon: Jessy Oto, MD;  Location: [facility];  Service: Orthopedics;  Laterality: Left;    FAMILY HISTORY Family History  Problem Relation Age of Onset  . Cancer Father   . Stroke Maternal Grandfather   . Cancer Paternal Grandmother     SOCIAL HISTORY Social History   Tobacco  Use  . Smoking status: Current Every Day Smoker    Packs/day: 0.75    Years: 36.00    Pack years: 27.00    Types: Cigarettes  . Smokeless tobacco: Never Used  Substance Use Topics  . Alcohol use: Yes    Comment: social  . Drug use: No         OPHTHALMIC EXAM:  Base Eye Exam    Visual Acuity (Snellen - Linear)      Right Left   Dist [place] 20/30 20/60 -2   Dist ph [place] 20/20 - 20/50       Tonometry (Tonopen, 1:17 PM)      Right Left   Pressure 12 15       Pupils      Dark Light Shape React APD   Right 3 2 Round Minimal None   Left 3 2 Round Minimal None       Visual Fields (Counting fingers)      Left Right    Full Full       Extraocular Movement      Right Left    Full, Ortho Full, Ortho       Neuro/Psych    Oriented x3: Yes   Mood/Affect: Normal       Dilation    Both eyes: 1.0% Mydriacyl, 2.5% Phenylephrine @ 1:17 PM        Slit Lamp and Fundus Exam    Slit Lamp Exam      Right Left   Lids/Lashes Mild Meibomian gland dysfunction Meibomian gland dysfunction   Conjunctiva/Sclera Mild Melanosis White and quiet   Cornea 1+ Punctate epithelial erosions, Arcus, Debris in tear film, well healed temporal cataract wounds 1+ Punctate epithelial erosions, Arcus, Well healed temporal cataract wounds, small sub 1mm round scar paracentral ST quad   Anterior Chamber Deep and quiet Deep and quiet   Iris Round and moderately dilated Round and moderately dilated to 21mm   Lens PC IOL in good position, mild PC folds 3 piece PC IOL in good position   Vitreous Vitreous syneresis Vitreous syneresis, no pigment, vitreous condensations inferiorly       Fundus Exam      Right Left   Disc mild tilt, +cupping, rim pink and sharp severe tilt, temporal Peripapillary atrophy, PPP   C/D Ratio 0.6 0.55   Macula Flat, Good foveal reflex, mild RPE mottling and clumping, No heme or edema Flat, blunted foveal reflex, posterior staphyloma flat, RPE atrophy / myopic degeneration nasal  macula   Vessels Mild Vascular attenuation, mild Copper wiring Mild Vascular attenuation, Tortuous, AV crossing changes   Periphery Attached, No heme  Attached, pigmented lattice from 0130-0300 - good early laser changes surrounding, scattered cystoid degeneration          IMAGING AND PROCEDURES  Imaging and Procedures for @TODAY @  OCT, Retina - OU - Both Eyes       Right Eye Quality was good. Central Foveal Thickness: 271. Progression has been stable. Findings include normal foveal contour, no IRF, no SRF, myopic contour.   Left Eye Quality was good. Central Foveal Thickness: 316. Progression has been stable. Findings include normal foveal contour, no IRF, no SRF, myopic contour, outer retinal atrophy (Severely myopic contour / staphyloma with myopic degeneration / ORA -- stable from prior).   Notes *Images captured and stored on drive  Diagnosis / Impression:  OD: NFP, no IRF/SRF, myopic contour OS: Severely myopic contour / posterior staphyloma with myopic degeneration / ORA; no IRF/SRF or CNVM -- stable  Clinical management:  See below  Abbreviations: NFP - Normal foveal profile. CME - cystoid macular edema. PED - pigment epithelial detachment. IRF - intraretinal fluid. SRF - subretinal fluid. EZ - ellipsoid zone. ERM - epiretinal membrane. ORA - outer retinal atrophy. ORT - outer retinal tubulation. SRHM - subretinal hyper-reflective material                 ASSESSMENT/PLAN:    ICD-10-CM   1. 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 Retinal edema  H35.81 OCT, Retina - OU - Both Eyes  6. Pseudophakia of both eyes  Z96.1     1-4. Severe Myopia OU (OS > OD) OS w/ posterior staphyloma, myopic degeneration and peripheral lattice degeneration  - pigmented lattice from 0130-0300 -- no retinal holes or SRF/RD on scleral depressed exam  -  s/p laser retinopexy OS (09.30.20) - good early laser changes surrounding  - continued PF QID OS since laser  - begin PF taper -- 3,2,1 dec every 5days  - f/u 3 months   5. No retinal edema on exam or OCT  6. Pseudophakia OU  - s/p CE/IOL (OD: August 2020, OS: October 2020S. Anderson)  - beautiful surgeries by Dr. Katy Fitch with IOLs in perfect position--pt doing well  - post op drops OD per Dr. Katy Fitch  - monitor   Ophthalmic Meds Ordered this visit:  No orders of the defined types were placed in this encounter.      Return in about 3 months (around 10/19/2019) for f/u severe myopia OU, DFE, OCT.  There are no Patient Instructions on file for this visit.   Explained the diagnoses, plan, and follow up with the patient and they expressed understanding.  Patient expressed understanding of the importance of proper follow up care.   This document serves as a record of services personally performed by Gardiner Sleeper, MD, PhD. It was created on their behalf by Ernest Mallick, OA, an ophthalmic assistant. The creation of this record is the provider's dictation and/or activities during the visit.    Electronically signed by: Ernest Mallick, OA 10.19.2020 2:09 PM     Gardiner Sleeper, M.D., Ph.D. Diseases & Surgery of the Retina and Vitreous [facility]  I have reviewed the above documentation for accuracy and completeness, and I agree with the above. Gardiner Sleeper, M.D., Ph.D. 07/19/19 2:09 PM    Abbreviations: M myopia (nearsighted); A astigmatism; H hyperopia (farsighted); P presbyopia; Mrx spectacle prescription;  CTL contact lenses; OD right eye; OS left eye; OU both eyes  XT exotropia; ET esotropia; PEK  punctate epithelial keratitis; PEE punctate epithelial erosions; DES dry eye syndrome; MGD meibomian gland dysfunction; ATs artificial tears; PFAT's preservative free artificial tears; [employer] nuclear sclerotic cataract; PSC posterior subcapsular cataract; ERM epi-retinal  membrane; PVD posterior vitreous detachment; RD retinal detachment; DM diabetes mellitus; DR diabetic retinopathy; NPDR non-proliferative diabetic retinopathy; PDR proliferative diabetic retinopathy; CSME clinically significant macular edema; DME diabetic macular edema; dbh dot blot hemorrhages; CWS cotton wool spot; POAG primary open angle glaucoma; C/D cup-to-disc ratio; HVF humphrey visual field; GVF goldmann visual field; OCT optical coherence tomography; IOP intraocular pressure; BRVO Branch retinal vein occlusion; CRVO central retinal vein occlusion; CRAO central retinal artery occlusion; BRAO branch retinal artery occlusion; RT retinal tear; SB scleral buckle; PPV pars plana vitrectomy; VH Vitreous hemorrhage; PRP panretinal laser photocoagulation; IVK intravitreal kenalog; VMT vitreomacular traction; MH Macular hole;  NVD neovascularization of the disc; NVE neovascularization elsewhere; AREDS age related eye disease study; ARMD age related macular degeneration; POAG primary open angle glaucoma; EBMD epithelial/anterior basement membrane dystrophy; ACIOL anterior chamber intraocular lens; IOL intraocular lens; PCIOL posterior chamber intraocular lens; Phaco/IOL phacoemulsification with intraocular lens placement; [place] photorefractive keratectomy; LASIK laser assisted in situ keratomileusis; HTN hypertension; DM diabetes mellitus; COPD chronic obstructive pulmonary disease

## 2019-10-01 NOTE — Progress Notes (Signed)
Cardiology Office Note:   Date:  10/04/2019  NAME:  Gene Anderson    MRN: [id number] DOB:  12-19-1960   PCP:  Nolene Ebbs, MD  Cardiologist:  Evalina Field, MD   Referring MD: Nolene Ebbs, MD   Chief Complaint  Patient presents with  . Atrial Fibrillation    History of Present Illness:   Gene Anderson is a 59 y.o. male with a hx of hypertension, atrial fibrillation (s/p DCCV 2015) who is being seen today for the evaluation of chest pain at the request of Nolene Ebbs, MD.  He reports he has not seen his cardiologist in 3 years.  No further recurrence of atrial fibrillation I can tell.  For the past few months he has had daily episodes of a sharp sensation in his chest.  He can get it at any time and occurs daily.  It can last 10 to 15 minutes.  There is no identifiable trigger or alleviating factor that I can tell.  He reports he was seen in urgent care and his blood pressure was elevated.  He was started on amlodipine.  He has noticed no major improvement in his symptoms with amlodipine.  He does report that got a little better over the past few weeks.  He reports he is concerned about possibly having heart disease.  He 1 to make sure things okay.  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  He reports no exertional chest pain or shortness of breath with that activity.  He does smoke still 1 pack a day for nearly 40 years.  His cholesterol suboptimal on recent check by his primary care physician.  He has no kidney disease.  No recent echocardiogram or stress test that I can see.  He does not report any palpitations and there is no further recurrence or documentation of atrial fibrillation either.  EKG today is without acute ischemic changes.  Problem List: 1. Atrial fibrillation s/p TEE/DCCV 2015 -no further recurrence (pill in pocket strategy) 2. Tobacco abuse 3. Hypertension 4. Hyperlipidemia   Past Medical History: Past Medical History:  Diagnosis Date    . Arrhythmia   . Atrial fibrillation ([place])   . Cataract    OD  . Diverticulosis   . Fatty liver 06/20/13  . Hyperplastic colon polyp   . Hypertension   . Internal hemorrhoids   . Leukopenia   . Neck pain   . Prostate enlargement   . Spinal stenosis     Past Surgical History: Past Surgical History:  Procedure Laterality Date  . CARDIOVERSION  03/2014  . CATARACT EXTRACTION Left 05/14/2019   Dr. Wyatt Portela  . EYE SURGERY    . Flexible cystoscopy and repair of torn tunica albuginea    . ULNAR NERVE TRANSPOSITION Left 08/26/2017   Procedure: LEFT ELBOW ULNAR NEUROLYSIS;  Surgeon: Jessy Oto, MD;  Location: [facility];  Service: Orthopedics;  Laterality: Left;    Current Medications: No outpatient medications have been marked as taking for the 10/04/19 encounter (Office Visit) with Geralynn Rile, MD.     Allergies:    Augmentin [amoxicillin-pot clavulanate]   Social History: Social History   Socioeconomic History  . Marital status: Married    Spouse name: Not on file  . Number of children: 4  . Years of education: 33  . Highest education level: Not on file  Occupational History    Employer: [employer]  Tobacco Use  .  Smoking status: Current Every Day Smoker    Packs/day: 0.75    Years: 40.00    Pack years: 30.00    Types: Cigarettes  . Smokeless tobacco: Never Used  Substance and Sexual Activity  . Alcohol use: Yes    Comment: social  . Drug use: No  . Sexual activity: Not on file  Other Topics Concern  . Not on file  Social History Narrative   Patient lives at home with his girl friend.   Patient is not working at this time.   Right handed.   Education college education.   Caffeine None   Social Determinants of Health   Financial Resource Strain:   . Difficulty of Paying Living Expenses: Not on file  Food Insecurity:   . Worried About Charity fundraiser in the Last Year: Not on file  . Ran Out of Food in the  Last Year: Not on file  Transportation Needs:   . Lack of Transportation (Medical): Not on file  . Lack of Transportation (Non-Medical): Not on file  Physical Activity:   . Days of Exercise per Week: Not on file  . Minutes of Exercise per Session: Not on file  Stress:   . Feeling of Stress : Not on file  Social Connections:   . Frequency of Communication with Friends and Family: Not on file  . Frequency of Social Gatherings with Friends and Family: Not on file  . Attends Religious Services: Not on file  . Active Member of Clubs or Organizations: Not on file  . Attends Archivist Meetings: Not on file  . Marital Status: Not on file     Family History: The patient's family history includes Cancer in his father and paternal grandmother; Hypertension in his mother; Stroke in his maternal grandfather.  ROS:   All other ROS reviewed and negative. Pertinent positives noted in the HPI.     EKGs/Labs/Other Studies Reviewed:   The following studies were personally reviewed by me today:  Labs from primary care office total cholesterol 215, HDL 65, LDL 134, triglycerides 69, TSH 1.14, A1c 5.7  EKG:  EKG is ordered today.  The ekg ordered today demonstrates normal sinus rhythm, heart rate 81, normal intervals, no acute ischemic changes, no prior for, and was personally reviewed by me.   Recent Labs: No results found for requested labs within last 8760 hours.   Recent Lipid Panel No results found for: CHOL, TRIG, HDL, CHOLHDL, VLDL, LDLCALC, LDLDIRECT  Physical Exam:   VS:  BP (!) 142/98 (BP Location: Left Arm, Patient Position: Sitting, Cuff Size: Normal)   Pulse 81   Ht 6\' 1"  (1.854 m)   Wt 225 lb (102.1 kg)   SpO2 99%   BMI 29.69 kg/m    Wt Readings from Last 3 Encounters:  10/04/19 225 lb (102.1 kg)  05/20/19 217 lb (98.4 kg)  02/08/19 217 lb (98.4 kg)    General: Well nourished, well developed, in no acute distress Heart: Atraumatic, normal size  Eyes: PEERLA,  EOMI  Neck: Supple, no JVD Endocrine: No thryomegaly Cardiac: Normal S1, S2; RRR; no murmurs, rubs, or gallops Lungs: Clear to auscultation bilaterally, no wheezing, rhonchi or rales  Abd: Soft, nontender, no hepatomegaly  Ext: No edema, pulses 2+ Musculoskeletal: No deformities, BUE and BLE strength normal and equal Skin: Warm and dry, no rashes   Neuro: Alert and oriented to person, place, time, and situation, CNII-XII grossly intact, no focal deficits  Psych: Normal mood  and affect   ASSESSMENT:   Gene Anderson is a 59 y.o. male who presents for the following: 1. Chest pain of uncertain etiology   2. Paroxysmal atrial fibrillation (HCC)   3. Essential hypertension   4. Mixed hyperlipidemia   5. Tobacco abuse   6. Precordial pain     PLAN:   1. Chest pain of uncertain etiology -Unclear etiology.  Reports a sharp sensation in his chest for the past few months.  He does have risk factors for cardiovascular disease including hypertension, active smoking.  EKG without acute ischemic changes and no recurrence of atrial fibrillation. -We will proceed with an echocardiogram as well as a Lexi nuclear medicine stress test to exclude CAD. -Recent thyroid studies normal -We will have him follow-up in 3 months after the above testing  2. Paroxysmal atrial fibrillation ([place]) -Status post TEE/cardioversion in 2015.  Chads vas score is 1. -No further recurrence and no symptoms suggest atrial fibrillation. -Should he need medication we will be able to likely start flecainide if his stress test is normal.  3. Essential hypertension -BP a bit up today.  On amlodipine.  Did not take today.  Instructed to take this when he sees me back.  4. Mixed hyperlipidemia -Given his history of smoking and hypertension, I would recommend statin therapy.  LDL goal less than 100 for now.  May be lower pending stress test.  5. Tobacco abuse -Smoking cessation counseling provided  6. Precordial  pain -Stress test as above  Disposition: Return in about 3 months (around 01/02/2020).  Medication Adjustments/Labs and Tests Ordered: Current medicines are reviewed at length with the patient today.  Concerns regarding medicines are outlined above.  Orders Placed This Encounter  Procedures  . MYOCARDIAL PERFUSION IMAGING  . EKG 12-Lead  . ECHOCARDIOGRAM COMPLETE   Meds ordered this encounter  Medications  . rosuvastatin (CRESTOR) 20 MG tablet    Sig: Take 1 tablet (20 mg total) by mouth daily.    Dispense:  90 tablet    Refill:  3    Patient Instructions  Medication Instructions:  Start taking 20mg  Crestor Daily  If you need a refill on your cardiac medications before your next appointment, please call your pharmacy.   Lab work: NONE  Testing/Procedures: Your physician has requested that you have an echocardiogram. Echocardiography is a painless test that uses sound waves to create images of your heart. It provides your doctor with information about the size and shape of your heart and how well your heart's chambers and valves are working. This procedure takes approximately one hour. There are no restrictions for this procedure. [place] has requested that you have a lexiscan myoview. For further information please visit HugeFiesta.[email or web address]. Please follow instruction sheet, as given.   Follow-Up: At [facility], you and your health needs are our priority.  As part of our continuing mission to provide you with exceptional heart care, we have created designated Provider Care Teams.  These Care Teams include your primary Cardiologist (physician) and Advanced Practice Providers (APPs -  Physician Assistants and Nurse Practitioners) who all work together to provide you with the care you need, when you need it. You may see Dr Audie Box or one of the following Advanced Practice Providers on your designated Care Team:    Almyra Deforest,  PA-C  Fabian Sharp, [place] or   Roby Lofts, [place]  Your physician wants you to follow-up in: 3 months  with Dr. Audie Box          Signed, Addison Naegeli. Audie Box, [place]  [address], [place], [place] [address] [phone]  10/04/2019 4:34 PM

## 2019-10-04 ENCOUNTER — Other Ambulatory Visit: Payer: Self-pay

## 2019-10-04 ENCOUNTER — Encounter: Payer: Self-pay | Admitting: Cardiovascular Disease

## 2019-10-04 ENCOUNTER — Ambulatory Visit: Payer: Medicaid Other | Admitting: Cardiovascular Disease

## 2019-10-04 VITALS — BP 142/98 | HR 81 | Ht 73.0 in | Wt 225.0 lb

## 2019-10-04 DIAGNOSIS — I10 Essential (primary) hypertension: Secondary | ICD-10-CM | POA: Diagnosis not present

## 2019-10-04 DIAGNOSIS — R072 Precordial pain: Secondary | ICD-10-CM

## 2019-10-04 DIAGNOSIS — R079 Chest pain, unspecified: Secondary | ICD-10-CM | POA: Diagnosis not present

## 2019-10-04 DIAGNOSIS — E782 Mixed hyperlipidemia: Secondary | ICD-10-CM | POA: Diagnosis not present

## 2019-10-04 DIAGNOSIS — Z720 Tobacco use: Secondary | ICD-10-CM

## 2019-10-04 DIAGNOSIS — I480 Paroxysmal atrial fibrillation: Secondary | ICD-10-CM | POA: Diagnosis not present

## 2019-10-04 MED ORDER — ROSUVASTATIN CALCIUM 20 MG PO TABS: 20.0000 mg | ORAL_TABLET | Freq: Every day | ORAL | 3 refills | Status: DC

## 2019-10-04 NOTE — Patient Instructions (Signed)
Medication Instructions:  Start taking 20mg  Crestor Daily  If you need a refill on your cardiac medications before your next appointment, please call your pharmacy.   Lab work: NONE  Testing/Procedures: Your physician has requested that you have an echocardiogram. Echocardiography is a painless test that uses sound waves to create images of your heart. It provides your doctor with information about the size and shape of your heart and how well your heart's chambers and valves are working. This procedure takes approximately one hour. There are no restrictions for this procedure. [place] has requested that you have a lexiscan myoview. For further information please visit HugeFiesta.[email or web address]. Please follow instruction sheet, as given.   Follow-Up: At [facility], you and your health needs are our priority.  As part of our continuing mission to provide you with exceptional heart care, we have created designated Provider Care Teams.  These Care Teams include your primary Cardiologist (physician) and Advanced Practice Providers (APPs -  Physician Assistants and Nurse Practitioners) who all work together to provide you with the care you need, when you need it. You may see Dr Audie Box or one of the following Advanced Practice Providers on your designated Care Team:    Almyra Deforest, PA-C  Fabian Sharp, [place] or   Roby Lofts, [place]  Your physician wants you to follow-up in: 3 months with Dr. Audie Box

## 2019-10-05 ENCOUNTER — Telehealth (HOSPITAL_COMMUNITY): Payer: Self-pay

## 2019-10-05 NOTE — Telephone Encounter (Signed)
Encounter complete. 

## 2019-10-06 ENCOUNTER — Other Ambulatory Visit: Payer: Self-pay

## 2019-10-06 ENCOUNTER — Ambulatory Visit (HOSPITAL_COMMUNITY)
Admission: RE | Admit: 2019-10-06 | Discharge: 2019-10-06 | Disposition: A | Discharge status: Home / Self Care | Payer: Medicaid Other | Source: Ambulatory Visit | Attending: Cardiology | Admitting: Cardiology

## 2019-10-06 DIAGNOSIS — R072 Precordial pain: Secondary | ICD-10-CM | POA: Insufficient documentation

## 2019-10-06 MED ORDER — TECHNETIUM TC 99M TETROFOSMIN IV KIT
31.8000 | PACK | Freq: Once | INTRAVENOUS | Status: AC | PRN
  Administered 2019-10-06: 31.8 via INTRAVENOUS
  Filled 2019-10-06: qty 32

## 2019-10-06 MED ORDER — TECHNETIUM TC 99M TETROFOSMIN IV KIT
10.7000 | PACK | Freq: Once | INTRAVENOUS | Status: AC | PRN
  Administered 2019-10-06: 10.7 via INTRAVENOUS
  Filled 2019-10-06: qty 11

## 2019-10-06 MED ORDER — REGADENOSON 0.4 MG/5ML IV SOLN
0.4000 mg | Freq: Once | INTRAVENOUS | Status: AC
  Administered 2019-10-06: 0.4 mg via INTRAVENOUS

## 2019-10-08 LAB — MYOCARDIAL PERFUSION IMAGING
LV dias vol: 187 mL (ref 62–150)
LV sys vol: 105 mL
Peak HR: 107 {beats}/min
Rest HR: 66 {beats}/min
SDS: 1
SRS: 0
SSS: 1
TID: 1.1

## 2019-10-12 ENCOUNTER — Other Ambulatory Visit (HOSPITAL_COMMUNITY): Payer: Medicaid Other

## 2019-10-18 NOTE — Progress Notes (Addendum)
[facility] Note  10/19/2019     CHIEF COMPLAINT Patient presents for Retina Follow Up   HISTORY OF PRESENT ILLNESS: Gene Anderson is a 59 y.o. male who presents to the clinic today for:   HPI    Retina Follow Up    Patient presents with  Other.  In both eyes.  This started months ago.  Severity is moderate.  Duration of months.  Since onset it is stable.  I, the attending physician,  performed the HPI with the patient and updated documentation appropriately.          Comments    Pt states vision is stable OU.  Patient denies eye pain but complains of pressure sensation OS.  Patient denies any new or worsening floaters or fol OU.       Last edited by [name], MD on 10/19/2019  1:59 PM. (History)    Patient states vision stable OU.  Referring physician: Nolene Ebbs, MD [place],  [place] [address]  HISTORICAL INFORMATION:   Selected notes from the MEDICAL RECORD NUMBER Referred by Dr. Wyatt Portela for concern of possible RD OS   CURRENT MEDICATIONS: Current Outpatient Medications (Ophthalmic Drugs)  Medication Sig  . naphazoline-pheniramine (NAPHCON-A) 0.025-0.3 % ophthalmic solution Place 1 drop into both eyes 2 (two) times daily as needed for irritation or allergies (USES IN THE MORNING).    No current facility-administered medications for this visit. (Ophthalmic Drugs)   Current Outpatient Medications (Other)  Medication Sig  . diclofenac (VOLTAREN) 75 MG EC tablet TAKE 1 TABLET(75 MG) BY MOUTH TWICE DAILY  . diclofenac sodium (VOLTAREN) 1 % GEL APPLY 2 GRAMS EXTERNALLY TO THE AFFECTED AREA FOUR TIMES DAILY AS NEEDED FOR PAIN  . Oxycodone HCl 20 MG TABS Take 20 m by mouth every 6 (six) hours.  . rosuvastatin (CRESTOR) 20 MG tablet Take 1 tablet (20 mg total) by mouth daily.   No current facility-administered medications for this visit. (Other)      REVIEW OF SYSTEMS: ROS    Positive for: Musculoskeletal,  Cardiovascular, Eyes   Negative for: Constitutional, Gastrointestinal, Neurological, Skin, Genitourinary, HENT, Endocrine, Respiratory, Psychiatric, Allergic/Imm, Heme/Lymph   Last edited by Doneen Poisson on 10/19/2019  1:01 PM. (History)       ALLERGIES Allergies  Allergen Reactions  . Augmentin [Amoxicillin-Pot Clavulanate] Hives    Morbilliform rash Has patient had a PCN reaction causing immediate rash, facial/tongue/throat swelling, SOB or lightheadedness with hypotension: Yes Has patient had a PCN reaction causing severe rash involving mucus membranes or skin necrosis: No Has patient had a PCN reaction that required hospitalization: No Has patient had a PCN reaction occurring within the last 10 years: No If all of the above answers are "NO", then may proceed with Cephalosporin use.     PAST MEDICAL HISTORY Past Medical History:  Diagnosis Date  . Arrhythmia   . Atrial fibrillation ([place])   . Cataract    OD  . Diverticulosis   . Fatty liver 06/20/13  . Hyperplastic colon polyp   . Hypertension   . Internal hemorrhoids   . Leukopenia   . Neck pain   . Prostate enlargement   . Spinal stenosis    Past Surgical History:  Procedure Laterality Date  . CARDIOVERSION  03/2014  . CATARACT EXTRACTION Left 05/14/2019   Dr. Wyatt Portela  . EYE SURGERY    . Flexible cystoscopy and repair of torn tunica albuginea    .  ULNAR NERVE TRANSPOSITION Left 08/26/2017   Procedure: LEFT ELBOW ULNAR NEUROLYSIS;  Surgeon: Jessy Oto, MD;  Location: [facility];  Service: Orthopedics;  Laterality: Left;    FAMILY HISTORY Family History  Problem Relation Age of Onset  . Hypertension Mother   . Cancer Father   . Stroke Maternal Grandfather   . Cancer Paternal Grandmother     SOCIAL HISTORY Social History   Tobacco Use  . Smoking status: Current Every Day Smoker    Packs/day: 0.75    Years: 40.00    Pack years: 30.00    Types: Cigarettes  . Smokeless tobacco:  Never Used  Substance Use Topics  . Alcohol use: Yes    Comment: social  . Drug use: No         OPHTHALMIC EXAM:  Base Eye Exam    Visual Acuity (Snellen - Linear)      Right Left   Dist [place] 20/40 -1 20/100 +2   Dist ph [place] 20/20 -2 20/50       Tonometry (Tonopen, 1:05 PM)      Right Left   Pressure 14 15       Pupils      Dark Light Shape React APD   Right 4 3 Round Brisk 0   Left 4 3 Round Brisk 0       Visual Fields      Left Right    Full Full       Extraocular Movement      Right Left    Full Full       Neuro/Psych    Oriented x3: Yes   Mood/Affect: Normal       Dilation    Both eyes: 1.0% Mydriacyl, 2.5% Phenylephrine @ 1:05 PM        Slit Lamp and Fundus Exam    Slit Lamp Exam      Right Left   Lids/Lashes Mild Meibomian gland dysfunction Meibomian gland dysfunction   Conjunctiva/Sclera Mild Melanosis White and quiet   Cornea 1+ Punctate epithelial erosions, Arcus, Debris in tear film, well healed temporal cataract wounds 1+ Punctate epithelial erosions, Arcus, Well healed temporal cataract wounds, small sub 38mm round scar paracentral ST quad   Anterior Chamber Deep and quiet Deep and quiet   Iris Round and moderately dilated Round and moderately dilated to 37mm   Lens PC IOL in good position, mild PC folds 3 piece PC IOL in good position   Vitreous Vitreous syneresis Vitreous syneresis, no pigment, vitreous condensations inferiorly       Fundus Exam      Right Left   Disc mild tilt, +cupping, rim pink and sharp severe tilt, temporal Peripapillary atrophy, PPP   C/D Ratio 0.6 0.55   Macula Flat, Good foveal reflex, mild RPE mottling and clumping, No heme or edema Flat, blunted foveal reflex, posterior staphyloma flat, RPE atrophy / myopic degeneration nasal macula, ?Punctate  Heme vs Pigment clumping temp to fovea   Vessels Mild Vascular attenuation, mild Copper wiring Mild Vascular attenuation, Tortuous, AV crossing changes   Periphery  Attached, No heme, no RT/RD  Attached, pigmented lattice from 0130-0300 - good laser changes surrounding, scattered cystoid degeneration, no new RT/RD          IMAGING AND PROCEDURES  Imaging and Procedures for @TODAY @  OCT, Retina - OU - Both Eyes       Right Eye Quality was good. Central Foveal Thickness: 273. Progression has been  stable. Findings include normal foveal contour, no IRF, no SRF, myopic contour, vitreomacular adhesion .   Left Eye Quality was good. Central Foveal Thickness: 295. Progression has been stable. Findings include normal foveal contour, no IRF, no SRF, myopic contour, outer retinal atrophy (Severely myopic contour / staphyloma with myopic degeneration / ORA -- stable from prior).   Notes *Images captured and stored on drive  Diagnosis / Impression:  OD: NFP, no IRF/SRF, myopic contour OS: Severely myopic contour / posterior staphyloma with myopic degeneration / ORA; no IRF/SRF or CNVM -- stable  Clinical management:  See below  Abbreviations: NFP - Normal foveal profile. CME - cystoid macular edema. PED - pigment epithelial detachment. IRF - intraretinal fluid. SRF - subretinal fluid. EZ - ellipsoid zone. ERM - epiretinal membrane. ORA - outer retinal atrophy. ORT - outer retinal tubulation. SRHM - subretinal hyper-reflective material                 ASSESSMENT/PLAN:    ICD-10-CM   1. 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 Retinal edema  H35.81 OCT, Retina - OU - Both Eyes  6. Pseudophakia of both eyes  Z96.1     1-4. Severe Myopia OU (OS > OD) OS w/ posterior staphyloma, myopic degeneration and peripheral lattice degeneration  - pigmented lattice from 0130-0300 -- no retinal holes or SRF/RD on scleral depressed exam  - s/p laser retinopexy OS (09.30.20) - good laser changes surrounding  - exam today shows  focal punctate macular heme OS -- myopic CNVM?  - monitor  - f/u 4-6 wks   5. No retinal edema on exam or OCT  6. Pseudophakia OU  - s/p CE/IOL (OD: August 2020, OS: October 2020[facility]. [place])  - beautiful surgeries by Dr. Katy Fitch with IOLs in perfect position--pt doing well  - post op drops OD per Dr. Katy Fitch  - monitor   Ophthalmic Meds Ordered this visit:  No orders of the defined types were placed in this encounter.      Return in about 4 weeks (around 11/16/2019) for Dilated exam, OCT.  There are no Patient Instructions on file for this visit.   Explained the diagnoses, plan, and follow up with the patient and they expressed understanding.  Patient expressed understanding of the importance of proper follow up care.   This document serves as a record of services personally performed by Gardiner Sleeper, MD, PhD. It was created on their behalf by Estill Bakes, COT an ophthalmic technician. The creation of this record is the provider's dictation and/or activities during the visit.    Electronically signed by: Estill Bakes, COT 10/18/19 @ 1:51 AM  Gardiner Sleeper, M.D., Ph.D. Diseases & Surgery of the Retina and [place] 10/19/2019   I have reviewed the above documentation for accuracy and completeness, and I agree with the above. Gardiner Sleeper, M.D., Ph.D. 10/24/19 1:51 AM   Abbreviations: M myopia (nearsighted); A astigmatism; H hyperopia (farsighted); P presbyopia; Mrx spectacle prescription;  CTL contact lenses; OD right eye; OS left eye; OU both eyes  XT exotropia; ET esotropia; PEK punctate epithelial keratitis; PEE punctate epithelial erosions; DES dry eye syndrome; MGD meibomian gland dysfunction; ATs artificial tears; PFAT's preservative free artificial tears; [place] nuclear sclerotic cataract; PSC posterior subcapsular cataract; ERM epi-retinal membrane; PVD  posterior vitreous detachment; RD retinal detachment; DM diabetes mellitus; DR  diabetic retinopathy; NPDR non-proliferative diabetic retinopathy; PDR proliferative diabetic retinopathy; CSME clinically significant macular edema; DME diabetic macular edema; dbh dot blot hemorrhages; CWS cotton wool spot; POAG primary open angle glaucoma; C/D cup-to-disc ratio; HVF humphrey visual field; GVF goldmann visual field; OCT optical coherence tomography; IOP intraocular pressure; BRVO Branch retinal vein occlusion; CRVO central retinal vein occlusion; CRAO central retinal artery occlusion; BRAO branch retinal artery occlusion; RT retinal tear; SB scleral buckle; PPV pars plana vitrectomy; VH Vitreous hemorrhage; PRP panretinal laser photocoagulation; IVK intravitreal kenalog; VMT vitreomacular traction; MH Macular hole;  NVD neovascularization of the disc; NVE neovascularization elsewhere; AREDS age related eye disease study; ARMD age related macular degeneration; POAG primary open angle glaucoma; EBMD epithelial/anterior basement membrane dystrophy; ACIOL anterior chamber intraocular lens; IOL intraocular lens; PCIOL posterior chamber intraocular lens; Phaco/IOL phacoemulsification with intraocular lens placement; [place] photorefractive keratectomy; LASIK laser assisted in situ keratomileusis; HTN hypertension; DM diabetes mellitus; COPD chronic obstructive pulmonary disease

## 2019-10-19 ENCOUNTER — Encounter (INDEPENDENT_AMBULATORY_CARE_PROVIDER_SITE_OTHER): Payer: Self-pay | Admitting: Ophthalmology

## 2019-10-19 ENCOUNTER — Ambulatory Visit (INDEPENDENT_AMBULATORY_CARE_PROVIDER_SITE_OTHER): Payer: Medicaid Other | Admitting: Ophthalmology

## 2019-10-19 DIAGNOSIS — H4422 Degenerative myopia, left eye: Secondary | ICD-10-CM

## 2019-10-19 DIAGNOSIS — Z961 Presence of intraocular lens: Secondary | ICD-10-CM

## 2019-10-19 DIAGNOSIS — H5213 Myopia, bilateral: Secondary | ICD-10-CM | POA: Diagnosis not present

## 2019-10-19 DIAGNOSIS — H35412 Lattice degeneration of retina, left eye: Secondary | ICD-10-CM | POA: Diagnosis not present

## 2019-10-19 DIAGNOSIS — H15832 Staphyloma posticum, left eye: Secondary | ICD-10-CM | POA: Diagnosis not present

## 2019-10-19 DIAGNOSIS — H3581 Retinal edema: Secondary | ICD-10-CM | POA: Diagnosis not present

## 2019-10-26 ENCOUNTER — Ambulatory Visit (HOSPITAL_COMMUNITY): Payer: Medicaid Other | Attending: Cardiovascular Disease

## 2019-10-26 ENCOUNTER — Other Ambulatory Visit: Payer: Self-pay

## 2019-10-26 DIAGNOSIS — R079 Chest pain, unspecified: Secondary | ICD-10-CM | POA: Insufficient documentation

## 2019-11-04 ENCOUNTER — Other Ambulatory Visit: Payer: Self-pay

## 2019-11-04 ENCOUNTER — Encounter: Payer: Self-pay | Admitting: Surgery

## 2019-11-04 ENCOUNTER — Ambulatory Visit (INDEPENDENT_AMBULATORY_CARE_PROVIDER_SITE_OTHER): Payer: Medicaid Other

## 2019-11-04 ENCOUNTER — Ambulatory Visit (INDEPENDENT_AMBULATORY_CARE_PROVIDER_SITE_OTHER): Payer: Medicaid Other | Admitting: Surgery

## 2019-11-04 VITALS — BP 146/93 | HR 88 | Ht 73.0 in | Wt 225.0 lb

## 2019-11-04 DIAGNOSIS — M48062 Spinal stenosis, lumbar region with neurogenic claudication: Secondary | ICD-10-CM

## 2019-11-04 DIAGNOSIS — M1712 Unilateral primary osteoarthritis, left knee: Secondary | ICD-10-CM

## 2019-11-04 DIAGNOSIS — M4316 Spondylolisthesis, lumbar region: Secondary | ICD-10-CM | POA: Diagnosis not present

## 2019-11-04 MED ORDER — BUPIVACAINE HCL 0.25 % IJ SOLN
6.0000 mL | INTRAMUSCULAR | Status: AC | PRN
  Administered 2019-11-04: 6 mL via INTRA_ARTICULAR

## 2019-11-04 MED ORDER — LIDOCAINE HCL 1 % IJ SOLN
3.0000 mL | INTRAMUSCULAR | Status: AC | PRN
  Administered 2019-11-04: 3 mL

## 2019-11-04 MED ORDER — METHYLPREDNISOLONE ACETATE 40 MG/ML IJ SUSP
40.0000 mg | INTRAMUSCULAR | Status: AC | PRN
  Administered 2019-11-04: 40 mg via INTRA_ARTICULAR

## 2019-11-04 NOTE — Progress Notes (Signed)
Office Visit Note   Patient: Gene Anderson           Date of Birth: [birth year]           MRN: [id number] Visit Date: 11/04/2019              Requested by: Gene Ebbs, MD [place],   [id number] PCP: Gene Ebbs, MD   Assessment & Plan: Visit Diagnoses:  1. Unilateral primary osteoarthritis, left knee   2. Spinal stenosis of lumbar region with neurogenic claudication   3. Spondylolisthesis at L4-L5 level     Plan: In hopes of giving some improvement of his knee pain offered to repeat injection.  At patient sent left knee was prepped with Betadine and intra-articular Marcaine/Depo-Medrol injection was performed.  Reviewed x-rays with patient today.  Advised him that ultimately may come down to him needing definitive treatment with total knee replacement but he would like to exhaust all conservative measures before going that route with his knee.  He will follow-up with me in 6 weeks for left knee and I will plan to do Visco series if we can get this approved through his insurance.  He understands that his lumbar spine is a much more complicated issue.  When he was last seen by Dr. Louanne Anderson August 2020 he had recommended surgery involved in a fusion.  I will have him follow-up in 2 weeks with Dr. Vernell Anderson for recheck and he can discuss with patient at that time as to whether or not conservative management with lumbar ESI's is indicated.  I may also consider doing viscosupplementation for the right knee where he has known DJD in the future.  Follow-Up Instructions: Return in about 2 weeks (around 11/18/2019) for 2 weeks with Gene Anderson recheck lumbar stenosis and 6 weeks with Gene Anderson for Knee injection.   Orders:  Orders Placed This Encounter  Procedures  . XR KNEE 3 VIEW LEFT   No orders of the defined types were placed in this encounter.     Procedures: Large Joint Inj on 11/04/2019 2:26 PM Indications: pain and joint swelling Details: 25 G needle, anteromedial  approach  Arthrogram: No  Medications: 3 mL lidocaine 1 %; 6 mL bupivacaine 0.25 %; 40 mg methylPREDNISolone acetate 40 MG/ML Outcome: tolerated well, no immediate complications Consent was given by the patient. Patient was prepped and draped in the usual sterile fashion.       Clinical Data: No additional findings.   Subjective: Chief Complaint  Patient presents with  . Left Knee - Pain  . Lower Back - Pain    HPI 59 year old black male returns with complaints of low back pain, bilateral lower extremity radiculopathy, neurogenic claudication and left knee pain.  Patient has known history of multilevel lumbar spondylosis/stenosis with spondylolisthesis.  Last seen for his lumbar spine August 2020 by Dr. Louanne Anderson and he had discussed surgical intervention with a fusion.  Patient states that he did not return because he is not ready to go that route.  States that his low back and leg symptoms are unchanged.  He is wondering if he could possibly have injections again.  Also continues to have ongoing pain in the left knee with ambulation and squatting.  He has known history of DJD in both knees but states that the left knee is worse today.  States that he has had this injected previously and had fairly good response.  Has not had viscosupplementation. Review of Systems No current cardiac pulmonary GI  GU issues  Objective: Vital Signs: BP (!) 146/93   Pulse 88   Ht 6\' 1"  (1.854 m)   Wt 225 lb (102.1 kg)   BMI 29.69 kg/m   Physical Exam HENT:     Head: Normocephalic and atraumatic.  Eyes:     Extraocular Movements: Extraocular movements intact.     Pupils: Pupils are equal, round, and reactive to light.  Pulmonary:     Effort: No respiratory distress.  Musculoskeletal:     Comments: Gait is somewhat antalgic.  Negative logroll bilateral hips.  Negative straight leg raise.  Left knee has good range of motion.  Positive crepitus.  Some swelling without large effusion.  Joint line  tender.  Neurological:     Mental Status: He is alert and oriented to person, place, and time.  Psychiatric:        Mood and Affect: Mood normal.     Ortho Exam  Specialty Comments:  No specialty comments available.  Imaging: No results found.   PMFS History: Patient Active Problem List   Diagnosis Date Noted  . 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 Atrial fibrillation ([place]) 04/18/2014  . Paresthesia 02/16/2014  . Neck pain 02/16/2014  . Leukopenia 02/16/2013   Past Medical History:  Diagnosis Date  . Arrhythmia   . Atrial fibrillation ([place])   . Cataract    OD  . Diverticulosis   . Fatty liver 06/20/13  . Hyperplastic colon polyp   . Hypertension   . Internal hemorrhoids   . Leukopenia   . Neck pain   . Prostate enlargement   . Spinal stenosis     Family History  Problem Relation Age of Onset  . Hypertension Mother   . Cancer Father   . Stroke Maternal Grandfather   . Cancer Paternal Grandmother     Past Surgical History:  Procedure Laterality Date  . CARDIOVERSION  03/2014  . CATARACT EXTRACTION Left 05/14/2019   Gene Anderson  . EYE SURGERY    . Flexible cystoscopy and repair of torn tunica albuginea    . ULNAR NERVE TRANSPOSITION Left 08/26/2017   Procedure: LEFT ELBOW ULNAR NEUROLYSIS;  Surgeon: Gene Oto, MD;  Location: [facility];  Service: Orthopedics;  Laterality: Left;   Social History   Occupational History    Employer: [employer]  Tobacco Use  . Smoking status: Current Every Day Smoker    Packs/day: 0.75    Years: 40.00    Pack years: 30.00    Types: Cigarettes  . Smokeless tobacco: Never Used  Substance and Sexual Activity  . Alcohol use: Yes    Comment: social  . Drug use: No  . Sexual activity: Not on file

## 2019-11-05 ENCOUNTER — Telehealth: Payer: Self-pay

## 2019-11-05 NOTE — Telephone Encounter (Signed)
Mailed [employer] Patient Assistance application for Orthovisc, left knee to address listed in chart.

## 2019-11-05 NOTE — Telephone Encounter (Signed)
Per Benjiman Core, would like for patient to have a gel injection.  Will submit for Orthovisc, left knee through [employer] Patient Assistance, due to patient having Medicaid insurance.

## 2019-11-15 NOTE — Progress Notes (Signed)
[facility] Note  11/16/2019     CHIEF COMPLAINT Patient presents for Retina Follow Up   HISTORY OF PRESENT ILLNESS: Gene Anderson is a 59 y.o. male who presents to the clinic today for:   HPI    Retina Follow Up    Patient presents with  Other.  In both eyes.  This started 4 weeks ago.  Severity is moderate.  I, the attending physician,  performed the HPI with the patient and updated documentation appropriately.          Comments    Patient here for 4 weeks retina follow up for dilated exam OU. Patient states vision doing ok. OS is a little cloudy. No eye pain.       Last edited by [name], MD on 11/16/2019  9:36 PM. (History)    Patient states his left eye seems a little cloudy  Referring physician: Nolene Ebbs, MD [place],  [place] [address]  HISTORICAL INFORMATION:   Selected notes from the MEDICAL RECORD NUMBER Referred by Dr. Wyatt Portela for concern of possible RD OS   CURRENT MEDICATIONS: Current Outpatient Medications (Ophthalmic Drugs)  Medication Sig  . naphazoline-pheniramine (NAPHCON-A) 0.025-0.3 % ophthalmic solution Place 1 drop into both eyes 2 (two) times daily as needed for irritation or allergies (USES IN THE MORNING).    No current facility-administered medications for this visit. (Ophthalmic Drugs)   Current Outpatient Medications (Other)  Medication Sig  . diclofenac (VOLTAREN) 75 MG EC tablet TAKE 1 TABLET(75 MG) BY MOUTH TWICE DAILY  . diclofenac sodium (VOLTAREN) 1 % GEL APPLY 2 GRAMS EXTERNALLY TO THE AFFECTED AREA FOUR TIMES DAILY AS NEEDED FOR PAIN  . Oxycodone HCl 20 MG TABS Take 20 m by mouth every 6 (six) hours.  . rosuvastatin (CRESTOR) 20 MG tablet Take 1 tablet (20 mg total) by mouth daily.   No current facility-administered medications for this visit. (Other)      REVIEW OF SYSTEMS: ROS    Positive for: Musculoskeletal, Cardiovascular, Eyes   Negative for: Constitutional,  Gastrointestinal, Neurological, Skin, Genitourinary, HENT, Endocrine, Respiratory, Psychiatric, Allergic/Imm, Heme/Lymph   Last edited by Theodore Demark, COA on 11/16/2019  1:57 PM. (History)       ALLERGIES Allergies  Allergen Reactions  . Augmentin [Amoxicillin-Pot Clavulanate] Hives    Morbilliform rash Has patient had a PCN reaction causing immediate rash, facial/tongue/throat swelling, SOB or lightheadedness with hypotension: Yes Has patient had a PCN reaction causing severe rash involving mucus membranes or skin necrosis: No Has patient had a PCN reaction that required hospitalization: No Has patient had a PCN reaction occurring within the last 10 years: No If all of the above answers are "NO", then may proceed with Cephalosporin use.     PAST MEDICAL HISTORY Past Medical History:  Diagnosis Date  . Arrhythmia   . Atrial fibrillation ([place])   . Cataract    OD  . Diverticulosis   . Fatty liver 06/20/13  . Hyperplastic colon polyp   . Hypertension   . Internal hemorrhoids   . Leukopenia   . Neck pain   . Prostate enlargement   . Spinal stenosis    Past Surgical History:  Procedure Laterality Date  . CARDIOVERSION  03/2014  . CATARACT EXTRACTION Left 05/14/2019   Dr. Wyatt Portela  . EYE SURGERY    . Flexible cystoscopy and repair of torn tunica albuginea    . ULNAR NERVE TRANSPOSITION Left 08/26/2017  Procedure: LEFT ELBOW ULNAR NEUROLYSIS;  Surgeon: Jessy Oto, MD;  Location: [facility];  Service: Orthopedics;  Laterality: Left;    FAMILY HISTORY Family History  Problem Relation Age of Onset  . Hypertension Mother   . Cancer Father   . Stroke Maternal Grandfather   . Cancer Paternal Grandmother     SOCIAL HISTORY Social History   Tobacco Use  . Smoking status: Current Every Day Smoker    Packs/day: 0.75    Years: 40.00    Pack years: 30.00    Types: Cigarettes  . Smokeless tobacco: Never Used  Substance Use Topics  . Alcohol  use: Yes    Comment: social  . Drug use: No         OPHTHALMIC EXAM:  Base Eye Exam    Visual Acuity (Snellen - Linear)      Right Left   Dist [place] 20/30 +2 20/80 -2   Dist ph [place] 20/25 -1 20/50 -2       Tonometry (Tonopen, 1:54 PM)      Right Left   Pressure 8 10       Pupils      Dark Light Shape React APD   Right 4 3 Round Brisk None   Left 4 3 Round Brisk None       Visual Fields (Counting fingers)      Left Right    Full Full       Extraocular Movement      Right Left    Full, Ortho Full, Ortho       Neuro/Psych    Oriented x3: Yes   Mood/Affect: Normal       Dilation    Both eyes: 1.0% Mydriacyl, 2.5% Phenylephrine @ 1:53 PM        Slit Lamp and Fundus Exam    Slit Lamp Exam      Right Left   Lids/Lashes Mild Meibomian gland dysfunction Meibomian gland dysfunction   Conjunctiva/Sclera Mild Melanosis White and quiet   Cornea Trace Punctate epithelial erosions, Arcus, well healed temporal cataract wounds Trace Punctate epithelial erosions, Arcus, Well healed temporal cataract wounds, small sub 66mm round scar paracentral ST quad   Anterior Chamber Deep and quiet Deep and quiet   Iris Round and moderately dilated Round and moderately dilated to 87mm   Lens PC IOL in good position, mild PC folds 3 piece PC IOL in good position   Vitreous Vitreous syneresis Vitreous syneresis, no pigment, vitreous condensations inferiorly       Fundus Exam      Right Left   Disc mild tilt, +cupping, rim pink and sharp severe tilt, temporal Peripapillary atrophy, PPP   C/D Ratio 0.6 0.55   Macula Flat, Good foveal reflex, mild RPE mottling and clumping, No heme or edema Flat, blunted foveal reflex, posterior staphyloma, RPE atrophy / myopic degeneration nasal macula, no heme, +Pigment clump   Vessels Mild Vascular attenuation, mild Copper wiring Mild Vascular attenuation, Tortuous, AV crossing changes   Periphery Attached, No heme, no RT/RD  Attached, pigmented lattice  from 0130-0300 - good laser changes surrounding, scattered cystoid degeneration, no new RT/RD          IMAGING AND PROCEDURES  Imaging and Procedures for @TODAY @  OCT, Retina - OU - Both Eyes       Right Eye Quality was good. Central Foveal Thickness: 269. Progression has been stable. Findings include normal foveal contour, no IRF, no SRF, myopic contour,  vitreomacular adhesion .   Left Eye Quality was good. Central Foveal Thickness: 322. Progression has been stable. Findings include normal foveal contour, no IRF, no SRF, myopic contour, outer retinal atrophy (Severely myopic contour / staphyloma with myopic degeneration / ORA -- stable from prior).   Notes *Images captured and stored on drive  Diagnosis / Impression:  OD: NFP, no IRF/SRF, myopic contour OS: Severely myopic contour / posterior staphyloma with myopic degeneration / ORA; no IRF/SRF or CNVM -- stable  Clinical management:  See below  Abbreviations: NFP - Normal foveal profile. CME - cystoid macular edema. PED - pigment epithelial detachment. IRF - intraretinal fluid. SRF - subretinal fluid. EZ - ellipsoid zone. ERM - epiretinal membrane. ORA - outer retinal atrophy. ORT - outer retinal tubulation. SRHM - subretinal hyper-reflective material                 ASSESSMENT/PLAN:    ICD-10-CM   1. 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 Retinal edema  H35.81 OCT, Retina - OU - Both Eyes  6. Pseudophakia of both eyes  Z96.1     1-4. Severe Myopia OU (OS > OD) OS w/ posterior staphyloma, myopic degeneration and peripheral lattice degeneration  - pigmented lattice from 0130-0300 -- no retinal holes or SRF/RD on scleral depressed exam  - s/p laser retinopexy OS (09.30.20) - good laser changes surrounding  - exam shows focal pigment clumping, no heme OS  - monitor  - f/u 1  year  5. No retinal edema on exam or OCT  6. Pseudophakia OU  - s/p CE/IOL (OD: August 2020, OS: October 2020 [facility])  - beautiful surgeries by Dr. Katy Fitch with IOLs in perfect position--pt doing well  - monitor   Ophthalmic Meds Ordered this visit:  No orders of the defined types were placed in this encounter.      Return in about 1 year (around 11/15/2020) for f/u severe myopia OU, DFE, OCT.  There are no Patient Instructions on file for this visit.   Explained the diagnoses, plan, and follow up with the patient and they expressed understanding.  Patient expressed understanding of the importance of proper follow up care.   This document serves as a record of services personally performed by Gardiner Sleeper, MD, PhD. It was created on their behalf by Estill Bakes, COT an ophthalmic technician. The creation of this record is the provider's dictation and/or activities during the visit.    Electronically signed by: Estill Bakes, COT 11/15/19 @ 9:50 PM   This document serves as a record of services personally performed by Gardiner Sleeper, MD, PhD. It was created on their behalf by Ernest Mallick, OA, an ophthalmic assistant. The creation of this record is the provider's dictation and/or activities during the visit.    Electronically signed by: Ernest Mallick, OA 02.16.2021 9:50 PM  Gardiner Sleeper, M.D., Ph.D. Diseases & Surgery of the Retina and [place] 11/16/2019   I have reviewed the above documentation for accuracy and completeness, and I agree with the above. Gardiner Sleeper, M.D., Ph.D. 11/16/19 9:50 PM   Abbreviations: M myopia (nearsighted); A astigmatism; H hyperopia (farsighted); P presbyopia; Mrx spectacle prescription;  CTL contact lenses; OD right eye; OS left eye; OU both eyes  XT exotropia; ET esotropia;  PEK punctate epithelial keratitis; PEE punctate epithelial erosions; DES dry eye syndrome; MGD meibomian gland dysfunction; ATs  artificial tears; PFAT's preservative free artificial tears; [place] nuclear sclerotic cataract; PSC posterior subcapsular cataract; ERM epi-retinal membrane; PVD posterior vitreous detachment; RD retinal detachment; DM diabetes mellitus; DR diabetic retinopathy; NPDR non-proliferative diabetic retinopathy; PDR proliferative diabetic retinopathy; CSME clinically significant macular edema; DME diabetic macular edema; dbh dot blot hemorrhages; CWS cotton wool spot; POAG primary open angle glaucoma; C/D cup-to-disc ratio; HVF humphrey visual field; GVF goldmann visual field; OCT optical coherence tomography; IOP intraocular pressure; BRVO Branch retinal vein occlusion; CRVO central retinal vein occlusion; CRAO central retinal artery occlusion; BRAO branch retinal artery occlusion; RT retinal tear; SB scleral buckle; PPV pars plana vitrectomy; VH Vitreous hemorrhage; PRP panretinal laser photocoagulation; IVK intravitreal kenalog; VMT vitreomacular traction; MH Macular hole;  NVD neovascularization of the disc; NVE neovascularization elsewhere; AREDS age related eye disease study; ARMD age related macular degeneration; POAG primary open angle glaucoma; EBMD epithelial/anterior basement membrane dystrophy; ACIOL anterior chamber intraocular lens; IOL intraocular lens; PCIOL posterior chamber intraocular lens; Phaco/IOL phacoemulsification with intraocular lens placement; [place] photorefractive keratectomy; LASIK laser assisted in situ keratomileusis; HTN hypertension; DM diabetes mellitus; COPD chronic obstructive pulmonary disease

## 2019-11-16 ENCOUNTER — Encounter (INDEPENDENT_AMBULATORY_CARE_PROVIDER_SITE_OTHER): Payer: Self-pay | Admitting: Ophthalmology

## 2019-11-16 ENCOUNTER — Ambulatory Visit (INDEPENDENT_AMBULATORY_CARE_PROVIDER_SITE_OTHER): Payer: Medicaid Other | Admitting: Ophthalmology

## 2019-11-16 DIAGNOSIS — H3581 Retinal edema: Secondary | ICD-10-CM | POA: Diagnosis not present

## 2019-11-16 DIAGNOSIS — H15832 Staphyloma posticum, left eye: Secondary | ICD-10-CM

## 2019-11-16 DIAGNOSIS — Z961 Presence of intraocular lens: Secondary | ICD-10-CM

## 2019-11-16 DIAGNOSIS — H35412 Lattice degeneration of retina, left eye: Secondary | ICD-10-CM

## 2019-11-16 DIAGNOSIS — H5213 Myopia, bilateral: Secondary | ICD-10-CM | POA: Diagnosis not present

## 2019-11-16 DIAGNOSIS — H4422 Degenerative myopia, left eye: Secondary | ICD-10-CM

## 2019-11-25 ENCOUNTER — Ambulatory Visit (INDEPENDENT_AMBULATORY_CARE_PROVIDER_SITE_OTHER): Payer: Medicaid Other | Admitting: Specialist

## 2019-11-25 ENCOUNTER — Other Ambulatory Visit: Payer: Self-pay

## 2019-11-25 ENCOUNTER — Encounter: Payer: Self-pay | Admitting: Specialist

## 2019-11-25 VITALS — BP 147/96 | HR 83 | Ht 73.0 in | Wt 225.0 lb

## 2019-11-25 DIAGNOSIS — G5622 Lesion of ulnar nerve, left upper limb: Secondary | ICD-10-CM

## 2019-11-25 DIAGNOSIS — M25461 Effusion, right knee: Secondary | ICD-10-CM | POA: Diagnosis not present

## 2019-11-25 DIAGNOSIS — M4316 Spondylolisthesis, lumbar region: Secondary | ICD-10-CM | POA: Diagnosis not present

## 2019-11-25 DIAGNOSIS — M1711 Unilateral primary osteoarthritis, right knee: Secondary | ICD-10-CM

## 2019-11-25 DIAGNOSIS — M48062 Spinal stenosis, lumbar region with neurogenic claudication: Secondary | ICD-10-CM | POA: Diagnosis not present

## 2019-11-25 DIAGNOSIS — M1712 Unilateral primary osteoarthritis, left knee: Secondary | ICD-10-CM

## 2019-11-25 NOTE — Progress Notes (Signed)
Office Visit Note   Patient: Gene Anderson           Date of Birth: Dec 18, 1960           MRN: [id number] Visit Date: 11/25/2019              Requested by: Nolene Ebbs, MD [address] [place],  [place] [address] PCP: Nolene Ebbs, MD   Assessment & Plan: Visit Diagnoses:  1. Spinal stenosis of lumbar region with neurogenic claudication   2. Spondylolisthesis at L4-L5 level   3. Ulnar neuropathy at elbow, left   4. Effusion, right knee   5. Unilateral primary osteoarthritis, left knee   6. Unilateral primary osteoarthritis, right knee     Plan: Avoid overhead lifting and overhead use of the arms. Do not lift greater than 5 lbs. Adjust head rest in vehicle to prevent hyperextension if rear ended.  Zotrix cream applied BID to the areas of numbness and tingling. Knee is suffering from osteoarthritis, only real proven treatments are Weight loss, NSIADs like diclofenac and exercise. Well padded shoes help.  Weight loss, NSIADs like diclofenac and exercise. Well padded shoes help. Ice the knee 2-3 times a day 15-20 mins at a time.-3 times a day 15-20 mins at a time. Hot showers in the AM.  Injection with steroid may be of benefit. Hemp CBD capsules, amazon.com 5,000-7,000 mg per bottle, 60 capsules per bottle, take one capsule twice a day. Cane in the left hand to use with left leg weight bearing. Follow-Up Instructions: No follow-ups on file.  Follow-Up Instructions: Return in about 3 months (around 02/22/2020).   Orders:  Orders Placed This Encounter  Procedures  . Ambulatory referral to Physical Medicine Rehab   No orders of the defined types were placed in this encounter.     Procedures: No procedures performed   Clinical Data: No additional findings.   Subjective: Chief Complaint  Patient presents with  . Lower Back - Follow-up  . Left Knee - Follow-up    59 year old male with history of left knee osteoarthritis and is having left knee pain  with night pain. He has pain that is sharp in the left knee and will awaken him with pain. Had xrays 1 1/2 weeks ago. He is also experiencing back pain and radiation across the lumbar spine transversely and then done the thighs posteriorly to then distal thighs not into the legs. No bowel or bladder difficulty. He is not able to walk a mile but perhaps 1/4 mile.    Review of Systems  Constitutional: Negative.  Negative for activity change, appetite change, chills, diaphoresis, fatigue, fever and unexpected weight change.  HENT: Negative.  Negative for congestion, dental problem, drooling, ear discharge, ear pain, facial swelling, hearing loss, mouth sores, nosebleeds, postnasal drip, rhinorrhea, sinus pressure, sinus pain, sneezing, sore throat, tinnitus, trouble swallowing and voice change.   Eyes: Negative.  Negative for photophobia, pain, discharge, redness, itching and visual disturbance.  Respiratory: Negative.  Negative for apnea, cough, choking, chest tightness, shortness of breath, wheezing and stridor.   Cardiovascular: Negative for chest pain, palpitations and leg swelling.  Gastrointestinal: Negative for abdominal distention, abdominal pain, anal bleeding, blood in stool, constipation, diarrhea, nausea, rectal pain and vomiting.  Endocrine: Negative.  Negative for cold intolerance, heat intolerance, polydipsia, polyphagia and polyuria.  Genitourinary: Negative.  Negative for difficulty urinating, dysuria, enuresis, flank pain, frequency, genital sores, hematuria and urgency.  Musculoskeletal: Positive for arthralgias, back pain, gait problem and  joint swelling. Negative for myalgias, neck pain and neck stiffness.  Allergic/Immunologic: Negative.  Negative for environmental allergies, food allergies and immunocompromised state.  Neurological: Positive for weakness and numbness. Negative for dizziness, facial asymmetry, light-headedness and headaches.  Hematological: Negative.     Psychiatric/Behavioral: Negative.  Negative for agitation, behavioral problems, confusion, decreased concentration, dysphoric mood, hallucinations, self-injury, sleep disturbance and suicidal ideas. The patient is not nervous/anxious and is not hyperactive.      Objective: Vital Signs: BP (!) 147/96 (BP Location: Left Arm, Patient Position: Sitting)   Pulse 83   Ht 6\' 1"  (1.854 m)   Wt 225 lb (102.1 kg)   BMI 29.69 kg/m   Physical Exam Constitutional:      Appearance: He is well-developed.  HENT:     Head: Normocephalic and atraumatic.  Eyes:     Pupils: Pupils are equal, round, and reactive to light.  Pulmonary:     Effort: Pulmonary effort is normal.     Breath sounds: Normal breath sounds.  Abdominal:     General: Bowel sounds are normal.     Palpations: Abdomen is soft.  Musculoskeletal:     Cervical back: Normal range of motion and neck supple.     Lumbar back: Negative right straight leg raise test and negative left straight leg raise test.  Skin:    General: Skin is warm and dry.  Neurological:     Mental Status: He is alert and oriented to person, place, and time.  Psychiatric:        Behavior: Behavior normal.        Thought Content: Thought content normal.        Judgment: Judgment normal.     Back Exam   Tenderness  The patient is experiencing tenderness in the lumbar.  Range of Motion  Extension: abnormal  Flexion: abnormal  Lateral bend right: abnormal  Lateral bend left: abnormal  Rotation right: abnormal  Rotation left: abnormal   Muscle Strength  Right Quadriceps:  5/5  Left Quadriceps:  5/5  Right Hamstrings:  5/5  Left Hamstrings:  5/5   Tests  Straight leg raise right: negative Straight leg raise left: negative  Reflexes  Patellar: 1/4 Achilles: 1/4 Babinski's sign: normal   Other  Toe walk: normal Heel walk: normal Sensation: normal Gait: normal  Erythema: no back redness Scars: absent      Specialty Comments:  No  specialty comments available.  Imaging: No results found.   PMFS History: Patient Active Problem List   Diagnosis Date Noted  . Cubital tunnel syndrome on left 08/26/2017    Priority: High    Class: Chronic  . Atrial fibrillation ([place]) 04/18/2014  . Paresthesia 02/16/2014  . Neck pain 02/16/2014  . Leukopenia 02/16/2013   Past Medical History:  Diagnosis Date  . Arrhythmia   . Atrial fibrillation ([place])   . Cataract    OD  . Diverticulosis   . Fatty liver 06/20/13  . Hyperplastic colon polyp   . Hypertension   . Internal hemorrhoids   . Leukopenia   . Neck pain   . Prostate enlargement   . Spinal stenosis     Family History  Problem Relation Age of Onset  . Hypertension Mother   . Cancer Father   . Stroke Maternal Grandfather   . Cancer Paternal Grandmother     Past Surgical History:  Procedure Laterality Date  . CARDIOVERSION  03/2014  . CATARACT EXTRACTION Left 05/14/2019   Dr. Wyatt Portela  .  EYE SURGERY    . Flexible cystoscopy and repair of torn tunica albuginea    . ULNAR NERVE TRANSPOSITION Left 08/26/2017   Procedure: LEFT ELBOW ULNAR NEUROLYSIS;  Surgeon: Jessy Oto, MD;  Location: [facility];  Service: Orthopedics;  Laterality: Left;   Social History   Occupational History    Employer: [employer]  Tobacco Use  . Smoking status: Current Every Day Smoker    Packs/day: 0.75    Years: 40.00    Pack years: 30.00    Types: Cigarettes  . Smokeless tobacco: Never Used  Substance and Sexual Activity  . Alcohol use: Yes    Comment: social  . Drug use: No  . Sexual activity: Not on file

## 2019-11-25 NOTE — Patient Instructions (Addendum)
Plan: Avoid overhead lifting and overhead use of the arms. Do not lift greater than 5 lbs. Adjust head rest in vehicle to prevent hyperextension if rear ended. Try to avoid injury to the area of the funny bone or back of the elbow.  Zotrix cream applied BID to the areas of numbness and tingling. Knee is suffering from osteoarthritis, only real proven treatments are Weight loss, NSIADs like diclofenac and exercise. Well padded shoes help.  Weight loss, NSIADs like diclofenac and exercise. Well padded shoes help. Ice the knee 2-3 times a day 15-20 mins at a time.-3 times a day 15-20 mins at a time. Hot showers in the AM.  Injection with steroid may be of benefit. Hemp CBD capsules, amazon.com 5,000-7,000 mg per bottle, 60 capsules per bottle, take one capsule twice a day. Cane in the left hand to use with left leg weight bearing. Follow-Up Instructions: No follow-ups on file.

## 2019-12-06 ENCOUNTER — Other Ambulatory Visit: Payer: Self-pay

## 2019-12-06 ENCOUNTER — Encounter: Payer: Self-pay | Admitting: Podiatry

## 2019-12-06 ENCOUNTER — Other Ambulatory Visit: Payer: Self-pay | Admitting: Podiatry

## 2019-12-06 ENCOUNTER — Ambulatory Visit: Payer: Medicaid Other | Admitting: Podiatry

## 2019-12-06 ENCOUNTER — Ambulatory Visit (INDEPENDENT_AMBULATORY_CARE_PROVIDER_SITE_OTHER): Payer: Medicaid Other

## 2019-12-06 VITALS — Temp 97.2°F

## 2019-12-06 DIAGNOSIS — M7751 Other enthesopathy of right foot: Secondary | ICD-10-CM | POA: Diagnosis not present

## 2019-12-06 DIAGNOSIS — M722 Plantar fascial fibromatosis: Secondary | ICD-10-CM

## 2019-12-06 DIAGNOSIS — M79671 Pain in right foot: Secondary | ICD-10-CM

## 2019-12-06 DIAGNOSIS — M779 Enthesopathy, unspecified: Secondary | ICD-10-CM

## 2019-12-08 NOTE — Progress Notes (Signed)
Subjective:   Patient ID: Gene Anderson, male   DOB: 59 y.o.   MRN: [id number]   HPI Patient states he is got a lot of pain between the fourth and fifth digits of the right foot and it is making it hard for him to be comfortable.  Does not remember specific injury but states he is dealt with this for a fairly long time it is not noted any drainage or redness or odor.  Patient states he will is mildly tender but not as bad as previously   ROS      Objective:  Physical Exam  Neurovascular status unchanged with patient found to have abnormal area between the fourth and fifth digits with keratotic tissue formation and pain with palpation.  Pain to palpation plantar heel region left over right     Assessment:  Probability for digital deformity with bone structural issues and lesion formation secondary to pressure with possibility for fungal infection.  Plantar fasciitis also present     Plan:  Reviewed condition and sterile debridement accomplished with no iatrogenic bleeding and reviewed with patient the utilization of creams and padding to separate toes and I did discussed hammertoe repair with possible partial syndactylization if symptoms persist.  At this point for the heels continue with supportive shoes physical therapy anti-inflammatories with review of condition today

## 2019-12-15 ENCOUNTER — Encounter: Payer: Medicaid Other | Admitting: Physical Medicine and Rehabilitation

## 2019-12-16 ENCOUNTER — Ambulatory Visit (INDEPENDENT_AMBULATORY_CARE_PROVIDER_SITE_OTHER): Payer: Medicaid Other | Admitting: Surgery

## 2019-12-16 ENCOUNTER — Encounter: Payer: Self-pay | Admitting: Surgery

## 2019-12-16 ENCOUNTER — Other Ambulatory Visit: Payer: Self-pay

## 2019-12-16 DIAGNOSIS — M48062 Spinal stenosis, lumbar region with neurogenic claudication: Secondary | ICD-10-CM

## 2019-12-16 NOTE — Progress Notes (Signed)
59 year old male comes in today on my schedule.  Patient was just seen by Dr. Donavan Burnet few weeks ago and was due to come back in 3 months.  He states that he is seeing me today for a lumbar ESI.  Advised patient that I do not perform those procedures.  There is an order in the system for this to be done with Dr. Ernestina Patches.  Dr. Romona Curls assistant will speak with patient today and see about scheduling this to be done and then he will follow-up with Dr. Louanne Skye as scheduled.  No charge patient for this visit.

## 2020-01-04 NOTE — Progress Notes (Signed)
Cardiology Office Note:   Date:  01/05/2020  NAME:  Gene Anderson    MRN: [id number] DOB:  06/20/1961   PCP:  Nolene Ebbs, MD  Cardiologist:  Evalina Field, MD   Referring MD: Nolene Ebbs, MD   Chief Complaint  Patient presents with  . Chest Pain   History of Present Illness:   [name] is a 59 y.o. male with a hx of atrial fibrillation, tobacco abuse, HTN, HLD who presents for follow-up of chest pain. Stress test normal. Echo normal.  He reports he is doing well.  The chest pain he was having is gone away.  He reports it was a sharp stabbing pain in the center of his chest.  It would occur most days.  It was occurring without any identifiable trigger.  It would last 10 to 15 minutes and go away on its own.  He reports deep breathing did help it.  Apparently had no further episodes since then.  He is reassured to know if stress test and echocardiogram are normal.  Regarding his atrial fibrillation, he has had no further recurrence since 2015.  He is not on anticoagulation which I think is reasonable given his young age and low chads vas score.  He will continue to monitor this.  He was on pill in pocket but we have stopped this as he had no need for this.  His blood pressure is a bit elevated today and he is unsure what medications he is on.  I have advised him to follow-up with his primary care physician about that.  He is still smoking.  Have advised him to quit.  Problem List: 1. Atrial fibrillation s/p TEE/DCCV 2015 -no further recurrence (pill in pocket strategy) -CHADSVASC=1 2. Tobacco abuse -40 pack years 3. Hypertension 4. Hyperlipidemia   Past Medical History: Past Medical History:  Diagnosis Date  . Arrhythmia   . Atrial fibrillation ([place])   . Cataract    OD  . Diverticulosis   . Fatty liver 06/20/13  . Hyperplastic colon polyp   . Hypertension   . Internal hemorrhoids   . Leukopenia   . Neck pain   . Prostate enlargement   . Spinal stenosis      Past Surgical History: Past Surgical History:  Procedure Laterality Date  . CARDIOVERSION  03/2014  . CATARACT EXTRACTION Left 05/14/2019   Dr. Wyatt Portela  . EYE SURGERY    . Flexible cystoscopy and repair of torn tunica albuginea    . ULNAR NERVE TRANSPOSITION Left 08/26/2017   Procedure: LEFT ELBOW ULNAR NEUROLYSIS;  Surgeon: Jessy Oto, MD;  Location: [facility];  Service: Orthopedics;  Laterality: Left;    Current Medications: Current Meds  Medication Sig  . diclofenac (VOLTAREN) 75 MG EC tablet TAKE 1 TABLET(75 MG) BY MOUTH TWICE DAILY  . diclofenac sodium (VOLTAREN) 1 % GEL APPLY 2 GRAMS EXTERNALLY TO THE AFFECTED AREA FOUR TIMES DAILY AS NEEDED FOR PAIN  . naphazoline-pheniramine (NAPHCON-A) 0.025-0.3 % ophthalmic solution Place 1 drop into both eyes 2 (two) times daily as needed for irritation or allergies (USES IN THE MORNING).   Marland Kitchen Oxycodone HCl 20 MG TABS Take 20 m by mouth every 6 (six) hours.  . rosuvastatin (CRESTOR) 20 MG tablet Take 1 tablet (20 mg total) by mouth daily.     Allergies:    Augmentin [amoxicillin-pot clavulanate]   Social History: Social History   Socioeconomic History  . Marital status: Married  Spouse name: Not on file  . Number of children: 4  . Years of education: 13  . Highest education level: Not on file  Occupational History    Employer: [employer]  Tobacco Use  . Smoking status: Current Every Day Smoker    Packs/day: 0.75    Years: 40.00    Pack years: 30.00    Types: Cigarettes  . Smokeless tobacco: Never Used  Substance and Sexual Activity  . Alcohol use: Yes    Comment: social  . Drug use: No  . Sexual activity: Not on file  Other Topics Concern  . Not on file  Social History Narrative   Patient lives at home with his girl friend.   Patient is not working at this time.   Right handed.   Education college education.   Caffeine None   Social Determinants of Health   Financial  Resource Strain:   . Difficulty of Paying Living Expenses:   Food Insecurity:   . Worried About Charity fundraiser in the Last Year:   . Arboriculturist in the Last Year:   Transportation Needs:   . Film/video editor (Medical):   Marland Kitchen Lack of Transportation (Non-Medical):   Physical Activity:   . Days of Exercise per Week:   . Minutes of Exercise per Session:   Stress:   . Feeling of Stress :   Social Connections:   . Frequency of Communication with Friends and Family:   . Frequency of Social Gatherings with Friends and Family:   . Attends Religious Services:   . Active Member of Clubs or Organizations:   . Attends Archivist Meetings:   Marland Kitchen Marital Status:      Family History: The patient's family history includes Cancer in his father and paternal grandmother; Hypertension in his mother; Stroke in his maternal grandfather.  ROS:   All other ROS reviewed and negative. Pertinent positives noted in the HPI.     EKGs/Labs/Other Studies Reviewed:   The following studies were personally reviewed by me today:  TTE 10/26/2019 1. Left ventricular ejection fraction, by visual estimation, is 60 to  65%. The left ventricle has normal function. There is no left ventricular  hypertrophy.  2. The left ventricle has no regional wall motion abnormalities.  3. Normal GLS -16.7.  4. Global right ventricle has normal systolic function.The right  ventricular size is normal. No increase in right ventricular wall  thickness.  5. Left atrial size was moderately dilated.  6. Right atrial size was normal.  7. The mitral valve is normal in structure. Trivial mitral valve  regurgitation. No evidence of mitral stenosis.  8. The tricuspid valve is normal in structure.  9. The tricuspid valve is normal in structure. Tricuspid valve  regurgitation is mild.  10. The aortic valve is tricuspid. Aortic valve regurgitation is not  visualized. Mild aortic valve sclerosis without  stenosis.  11. The pulmonic valve was normal in structure. Pulmonic valve  regurgitation is trivial.  12. Aortic dilatation noted.  13. There is mild dilatation of the aortic root measuring 38 mm.  14. Normal pulmonary artery systolic pressure.  15. The inferior vena cava is normal in size with greater than 50%  respiratory variability, suggesting right atrial pressure of 3 mmHg.   NM Stress 10/06/2019  Probable normal perfusion and minimal soft tissue attenuation (diaphragm) No significant ischemia or scar.  Nuclear stress EF: 44%. cconsider echo to further define LVEF/wall motion.  There was no ST segment deviation noted during stress.  This is an intermediate risk study.  Recent Labs: No results found for requested labs within last 8760 hours.   Recent Lipid Panel No results found for: CHOL, TRIG, HDL, CHOLHDL, VLDL, LDLCALC, LDLDIRECT  Physical Exam:   VS:  BP (!) 144/94   Pulse 76   Ht 6\' 1"  (1.854 m)   Wt 223 lb (101.2 kg)   SpO2 94%   BMI 29.42 kg/m    Wt Readings from Last 3 Encounters:  01/05/20 223 lb (101.2 kg)  11/25/19 225 lb (102.1 kg)  11/04/19 225 lb (102.1 kg)    General: Well nourished, well developed, in no acute distress Heart: Atraumatic, normal size  Eyes: PEERLA, EOMI  Neck: Supple, no JVD Endocrine: No thryomegaly Cardiac: Normal S1, S2; RRR; no murmurs, rubs, or gallops Lungs: Clear to auscultation bilaterally, no wheezing, rhonchi or rales  Abd: Soft, nontender, no hepatomegaly  Ext: No edema, pulses 2+ Musculoskeletal: No deformities, BUE and BLE strength normal and equal Skin: Warm and dry, no rashes   Neuro: Alert and oriented to person, place, time, and situation, CNII-XII grossly intact, no focal deficits  Psych: Normal mood and affect   ASSESSMENT:   THOREN KOBY is a 59 y.o. male who presents for the following: 1. Chest pain, unspecified type   2. Paroxysmal atrial fibrillation (HCC)   3. Essential hypertension   4. Mixed  hyperlipidemia   5. Tobacco abuse     PLAN:   1. Chest pain, unspecified type -Normal stress test.  Normal echo.  Symptoms have resolved.  Symptoms are atypical.  Possibly blood pressure related.  No further work-up needed.  He will follow-up with his primary care regarding blood pressure.  2. Paroxysmal atrial fibrillation ([place]) -Status post TEE/cardioversion in 2015.  He was on pill in pocket.  His chads vas score is 1.  No need for anticoagulation moving forward.  No recurrence that I can tell.  He will let us know if he has any issues.  3. Essential hypertension -Advised to follow-up with primary care physician.  He would be a good candidate for calcium channel blocker if needed.  4. Mixed hyperlipidemia -Continue Crestor.  5. Tobacco abuse -Smoking cessation counseling provided  Disposition: Return if symptoms worsen or fail to improve.  Medication Adjustments/Labs and Tests Ordered: Current medicines are reviewed at length with the patient today.  Concerns regarding medicines are outlined above.  No orders of the defined types were placed in this encounter.  No orders of the defined types were placed in this encounter.   Patient Instructions  Medication Instructions:  The current medical regimen is effective;  continue present plan and medications.  *If you need a refill on your cardiac medications before your next appointment, please call your pharmacy*   Follow-Up: At [facility], you and your health needs are our priority.  As part of our continuing mission to provide you with exceptional heart care, we have created designated Provider Care Teams.  These Care Teams include your primary Cardiologist (physician) and Advanced Practice Providers (APPs -  Physician Assistants and Nurse Practitioners) who all work together to provide you with the care you need, when you need it.  We recommend signing up for the patient portal called "MyChart".  Sign up information is  provided on this After Visit Summary.  MyChart is used to connect with patients for Virtual Visits (Telemedicine).  Patients are able to view lab/test results,  encounter notes, upcoming appointments, etc.  Non-urgent messages can be sent to your provider as well.   To learn more about what you can do with MyChart, go to [email or web address].    Your next appointment:   PRN  The format for your next appointment:   In Person  Provider:   Eleonore Chiquito, MD         Time Spent with Patient: I have spent a total of 25 minutes with patient reviewing hospital notes, telemetry, EKGs, labs and examining the patient as well as establishing an assessment and plan that was discussed with the patient.  > 50% of time was spent in direct patient care.  Signed, Addison Naegeli. Audie Box, [facility] [place]  [address], [place], [place] [address] [phone]  01/05/2020 1:31 PM

## 2020-01-05 ENCOUNTER — Ambulatory Visit: Payer: Medicaid Other | Admitting: Cardiovascular Disease

## 2020-01-05 ENCOUNTER — Encounter: Payer: Self-pay | Admitting: Cardiovascular Disease

## 2020-01-05 ENCOUNTER — Other Ambulatory Visit: Payer: Self-pay

## 2020-01-05 VITALS — BP 144/94 | HR 76 | Ht 73.0 in | Wt 223.0 lb

## 2020-01-05 DIAGNOSIS — I10 Essential (primary) hypertension: Secondary | ICD-10-CM | POA: Diagnosis not present

## 2020-01-05 DIAGNOSIS — I480 Paroxysmal atrial fibrillation: Secondary | ICD-10-CM | POA: Diagnosis not present

## 2020-01-05 DIAGNOSIS — R079 Chest pain, unspecified: Secondary | ICD-10-CM | POA: Diagnosis not present

## 2020-01-05 DIAGNOSIS — E782 Mixed hyperlipidemia: Secondary | ICD-10-CM | POA: Diagnosis not present

## 2020-01-05 DIAGNOSIS — Z720 Tobacco use: Secondary | ICD-10-CM

## 2020-01-05 NOTE — Patient Instructions (Signed)
Medication Instructions:  The current medical regimen is effective;  continue present plan and medications.  *If you need a refill on your cardiac medications before your next appointment, please call your pharmacy*   Follow-Up: At [facility], you and your health needs are our priority.  As part of our continuing mission to provide you with exceptional heart care, we have created designated Provider Care Teams.  These Care Teams include your primary Cardiologist (physician) and Advanced Practice Providers (APPs -  Physician Assistants and Nurse Practitioners) who all work together to provide you with the care you need, when you need it.  We recommend signing up for the patient portal called "MyChart".  Sign up information is provided on this After Visit Summary.  MyChart is used to connect with patients for Virtual Visits (Telemedicine).  Patients are able to view lab/test results, encounter notes, upcoming appointments, etc.  Non-urgent messages can be sent to your provider as well.   To learn more about what you can do with MyChart, go to [email or web address].    Your next appointment:   PRN  The format for your next appointment:   In Person  Provider:   [place] O'Neal, MD    

## 2020-01-06 ENCOUNTER — Encounter: Payer: Self-pay | Admitting: Physical Medicine and Rehabilitation

## 2020-01-06 ENCOUNTER — Ambulatory Visit (INDEPENDENT_AMBULATORY_CARE_PROVIDER_SITE_OTHER): Payer: Medicaid Other | Admitting: Physical Medicine and Rehabilitation

## 2020-01-06 ENCOUNTER — Ambulatory Visit: Payer: Self-pay

## 2020-01-06 VITALS — BP 147/96 | HR 77

## 2020-01-06 DIAGNOSIS — M5416 Radiculopathy, lumbar region: Secondary | ICD-10-CM

## 2020-01-06 DIAGNOSIS — M48062 Spinal stenosis, lumbar region with neurogenic claudication: Secondary | ICD-10-CM

## 2020-01-06 MED ORDER — METHYLPREDNISOLONE ACETATE 80 MG/ML IJ SUSP
40.0000 mg | Freq: Once | INTRAMUSCULAR | Status: AC
  Administered 2020-01-06: 40 mg

## 2020-01-06 NOTE — Progress Notes (Signed)
 .  Numeric Pain Rating Scale and Functional Assessment Average Pain 6   In the last MONTH (on 0-10 scale) has pain interfered with the following?  1. General activity like being  able to carry out your everyday physical activities such as walking, climbing stairs, carrying groceries, or moving a chair?  Rating(7)   +Driver, -BT, -Dye Allergies.  

## 2020-01-10 NOTE — Procedures (Signed)
Lumbosacral Transforaminal Epidural Steroid Injection - Sub-Pedicular Approach with Fluoroscopic Guidance  Patient: Gene Anderson      Date of Birth: [birth year] MRN: [id number] PCP: Nolene Ebbs, MD      Visit Date: 01/06/2020   Universal Protocol:    Date/Time: 01/06/2020  Consent Given By: the patient  Position: PRONE  Additional Comments: Vital signs were monitored before and after the procedure. Patient was prepped and draped in the usual sterile fashion. The correct patient, procedure, and site was verified.   Injection Procedure Details:  Procedure Site One Meds Administered:  Meds ordered this encounter  Medications  . methylPREDNISolone acetate (DEPO-MEDROL) injection 40 mg    Laterality: Left  Location/Site:  L4-L5  Needle size: 22 G  Needle type: Spinal  Needle Placement: Transforaminal  Findings:    -Comments: Excellent flow of contrast along the nerve and into the epidural space.  Procedure Details: After squaring off the end-plates to get a true AP view, the C-arm was positioned so that an oblique view of the foramen as noted above was visualized. The target area is just inferior to the "nose of the scotty dog" or sub pedicular. The soft tissues overlying this structure were infiltrated with 2-3 ml. of 1% Lidocaine without Epinephrine.  The spinal needle was inserted toward the target using a "trajectory" view along the fluoroscope beam.  Under AP and lateral visualization, the needle was advanced so it did not puncture dura and was located close the 6 O'Clock position of the pedical in AP tracterory. Biplanar projections were used to confirm position. Aspiration was confirmed to be negative for CSF and/or blood. A 1-2 ml. volume of Isovue-250 was injected and flow of contrast was noted at each level. Radiographs were obtained for documentation purposes.   After attaining the desired flow of contrast documented above, a 0.5 to 1.0 ml test dose of 0.25%  Marcaine was injected into each respective transforaminal space.  The patient was observed for 90 seconds post injection.  After no sensory deficits were reported, and normal lower extremity motor function was noted,   the above injectate was administered so that equal amounts of the injectate were placed at each foramen (level) into the transforaminal epidural space.   Additional Comments:  The patient tolerated the procedure well Dressing: 2 x 2 sterile gauze and Band-Aid    Post-procedure details: Patient was observed during the procedure. Post-procedure instructions were reviewed.  Patient left the clinic in stable condition.

## 2020-01-10 NOTE — Progress Notes (Signed)
Gene Anderson - 59 y.o. male MRN [id number]  Date of birth: 1961-06-01  Office Visit Note: Visit Date: 01/06/2020 PCP: Nolene Ebbs, MD Referred by: Nolene Ebbs, MD  Subjective: Chief Complaint  Patient presents with  . Lower Back - Pain  . Left Leg - Pain   HPI:  Gene Anderson is a 59 y.o. male who comes in today For planned repeat left L4 transforaminal epidural steroid injection.  This is requested by Dr. [name].  Patient is having low back and left radicular leg pain.  Prior injection in July gave him quite a bit of relief.  MRI reviewed below  ROS Otherwise per HPI.  Assessment & Plan: Visit Diagnoses:  1. Lumbar radiculopathy   2. Spinal stenosis of lumbar region with neurogenic claudication     Plan: No additional findings.   Meds & Orders:  Meds ordered this encounter  Medications  . methylPREDNISolone acetate (DEPO-MEDROL) injection 40 mg    Orders Placed This Encounter  Procedures  . XR C-ARM NO REPORT  . Epidural Steroid injection    Follow-up: Return for visit to requesting physician as needed.   Procedures: No procedures performed  Lumbosacral Transforaminal Epidural Steroid Injection - Sub-Pedicular Approach with Fluoroscopic Guidance  Patient: Gene Anderson      Date of Birth: 07-Feb-1961 MRN: [id number] PCP: Nolene Ebbs, MD      Visit Date: 01/06/2020   Universal Protocol:    Date/Time: 01/06/2020  Consent Given By: the patient  Position: PRONE  Additional Comments: Vital signs were monitored before and after the procedure. Patient was prepped and draped in the usual sterile fashion. The correct patient, procedure, and site was verified.   Injection Procedure Details:  Procedure Site One Meds Administered:  Meds ordered this encounter  Medications  . methylPREDNISolone acetate (DEPO-MEDROL) injection 40 mg    Laterality: Left  Location/Site:  L4-L5  Needle size: 22 G  Needle type: Spinal  Needle Placement:  Transforaminal  Findings:    -Comments: Excellent flow of contrast along the nerve and into the epidural space.  Procedure Details: After squaring off the end-plates to get a true AP view, the C-arm was positioned so that an oblique view of the foramen as noted above was visualized. The target area is just inferior to the "nose of the scotty dog" or sub pedicular. The soft tissues overlying this structure were infiltrated with 2-3 ml. of 1% Lidocaine without Epinephrine.  The spinal needle was inserted toward the target using a "trajectory" view along the fluoroscope beam.  Under AP and lateral visualization, the needle was advanced so it did not puncture dura and was located close the 6 O'Clock position of the pedical in AP tracterory. Biplanar projections were used to confirm position. Aspiration was confirmed to be negative for CSF and/or blood. A 1-2 ml. volume of Isovue-250 was injected and flow of contrast was noted at each level. Radiographs were obtained for documentation purposes.   After attaining the desired flow of contrast documented above, a 0.5 to 1.0 ml test dose of 0.25% Marcaine was injected into each respective transforaminal space.  The patient was observed for 90 seconds post injection.  After no sensory deficits were reported, and normal lower extremity motor function was noted,   the above injectate was administered so that equal amounts of the injectate were placed at each foramen (level) into the transforaminal epidural space.   Additional Comments:  The patient tolerated the procedure well Dressing: 2 x  2 sterile gauze and Band-Aid    Post-procedure details: Patient was observed during the procedure. Post-procedure instructions were reviewed.  Patient left the clinic in stable condition.     Clinical History: MRI LUMBAR SPINE WITHOUT CONTRAST  TECHNIQUE: Multiplanar, multisequence MR imaging of the lumbar spine was performed. No intravenous contrast was  administered.  COMPARISON:  12/09/2017.  MRI of the lumbar spine 02/26/2013  FINDINGS: Segmentation: 5 non rib-bearing lumbar type vertebral bodies are present.  Alignment: Grade 1 anterolisthesis is present at L4-5. AP alignment is otherwise anatomic.  Vertebrae: Concave end plates are similar the prior studies. Chronic endplate marrow changes are worse left than right at T12-L1. Fatty infiltration into the sacrum and iliac bones is noted.  Conus medullaris and cauda equina: Conus extends to the L1 level. Conus and cauda equina appear normal.  Paraspinal and other soft tissues: Limited imaging the abdomen is unremarkable. There is no significant adenopathy.  Disc levels:  The disc levels at L1-2 and L2-3 are unremarkable.  L3-4: A broad-based disc protrusion is present. Moderate facet hypertrophy is slightly worse. There is progressive epidural fat with crowding of the nerve roots in the thecal sac. Mild foraminal narrowing has progressed bilaterally.  L4-5: A broad-based disc protrusion is evident. Advanced facet hypertrophy is again noted. Prominent epidural fat is present. The combination compresses the nerve roots. Mild foraminal narrowing demonstrate some progression bilaterally.  L5-S1: A broad-based disc protrusion is present. Prominent epidural fat is noted. Mild facet hypertrophy has progressed.  IMPRESSION: 1. 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 2. Progressive mild foraminal narrowing bilaterally at L3-4 and L4-5. 3. The foramina at L5-S1 are patent. 4. Grade 1 anterolisthesis at L4-5.   Electronically Signed   By: San Morelle M.D.   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     Objective:  VS:  HT:    WT:   BMI:     BP:(!) 147/96  HR:77bpm  TEMP: ( )  RESP:  Physical Exam  Ortho Exam Imaging: No results found.

## 2020-02-07 ENCOUNTER — Other Ambulatory Visit: Payer: Self-pay

## 2020-02-07 ENCOUNTER — Ambulatory Visit (INDEPENDENT_AMBULATORY_CARE_PROVIDER_SITE_OTHER): Payer: Medicaid Other | Admitting: Podiatry

## 2020-02-07 VITALS — Temp 97.2°F

## 2020-02-07 DIAGNOSIS — M779 Enthesopathy, unspecified: Secondary | ICD-10-CM

## 2020-02-07 DIAGNOSIS — M2041 Other hammer toe(s) (acquired), right foot: Secondary | ICD-10-CM | POA: Diagnosis not present

## 2020-02-07 DIAGNOSIS — M7751 Other enthesopathy of right foot: Secondary | ICD-10-CM | POA: Diagnosis not present

## 2020-02-10 NOTE — Progress Notes (Signed)
Subjective:   Patient ID: Gene Anderson, male   DOB: 59 y.o.   MRN: [id number]   HPI Patient states have had this chronic pain between my fourth and fifth toes and I am most likely getting need surgery on this but I am looking for a conservative procedure at the current time with pain associated with this   ROS      Objective:  Physical Exam  Neurovascular status found to be unchanged from previous visits with no other health history changes.  Patient is noted to have a thickened keratotic lesion between the fourth and fifth toes with inflammation fluid buildup around the joint surface with pain     Assessment:  Combination of digital deformity with rotational component with keratotic chronic interspace lesion and fluid buildup around the fourth MPJ     Plan:  H&P discussed digital procedures including partial syndactylization.  I do think this will be necessary but he cannot do it at this time he would like short-term relief.  I did go ahead and I did a sterile prep and I injected the fourth MPJ 3 mg dexamethasone to reduce the inflammatory process and I then again reviewed the surgical intervention including partial syndactyly arthroplasty procedures showing him x-rays and what would be required.  He will most likely do this at the end of summer but hopefully we can get him through the summer at this time

## 2020-02-24 ENCOUNTER — Ambulatory Visit: Payer: Medicaid Other | Admitting: Specialist

## 2020-03-16 ENCOUNTER — Encounter: Payer: Self-pay | Admitting: Podiatry

## 2020-03-16 ENCOUNTER — Other Ambulatory Visit: Payer: Self-pay

## 2020-03-16 ENCOUNTER — Ambulatory Visit: Payer: Medicaid Other | Admitting: Podiatry

## 2020-03-16 DIAGNOSIS — M7751 Other enthesopathy of right foot: Secondary | ICD-10-CM | POA: Diagnosis not present

## 2020-03-16 DIAGNOSIS — M2041 Other hammer toe(s) (acquired), right foot: Secondary | ICD-10-CM

## 2020-03-16 DIAGNOSIS — M779 Enthesopathy, unspecified: Secondary | ICD-10-CM

## 2020-03-16 MED ORDER — TERBINAFINE HCL 250 MG PO TABS: 250.0000 mg | ORAL_TABLET | Freq: Every day | ORAL | 0 refills | Status: DC

## 2020-04-13 ENCOUNTER — Other Ambulatory Visit: Payer: Self-pay

## 2020-04-13 ENCOUNTER — Encounter: Payer: Self-pay | Admitting: Podiatry

## 2020-04-13 ENCOUNTER — Ambulatory Visit (INDEPENDENT_AMBULATORY_CARE_PROVIDER_SITE_OTHER): Payer: Medicaid Other | Admitting: Podiatry

## 2020-04-13 VITALS — Temp 97.3°F

## 2020-04-13 DIAGNOSIS — M2041 Other hammer toe(s) (acquired), right foot: Secondary | ICD-10-CM

## 2020-04-13 DIAGNOSIS — M79671 Pain in right foot: Secondary | ICD-10-CM

## 2020-04-13 DIAGNOSIS — M779 Enthesopathy, unspecified: Secondary | ICD-10-CM | POA: Diagnosis not present

## 2020-04-13 DIAGNOSIS — M79672 Pain in left foot: Secondary | ICD-10-CM | POA: Diagnosis not present

## 2020-04-13 NOTE — Progress Notes (Signed)
Subjective:   Patient ID: Baruch Goldmann, male   DOB: 59 y.o.   MRN: [id number]   HPI Patient states feeling a lot better with skin still irritated between his toes but improving after I used medication at last visit   ROS      Objective:  Physical Exam  Neuro neurovascular status intact with patient found to have irritated tissue between the fourth and fifth digits of both feet localized with the right being worse than the left with history of inflammation     Assessment:  Combination hammertoe deformity with lesion formation pressure with fluid buildup     Plan:  H&P reviewed all conditions discussed different treatment options and we have opted for topical medicines wider shoes and soaks.  Patient understands at 1 point future may require surgical intervention

## 2020-07-28 ENCOUNTER — Telehealth: Payer: Self-pay | Admitting: Cardiovascular Disease

## 2020-07-28 NOTE — Telephone Encounter (Signed)
Agree if things get worse to seek urgent care evaluation.   [place] T. Audie Box, [place]  [address], [place], [place] [address] [phone]  10:01 AM

## 2020-07-28 NOTE — Telephone Encounter (Signed)
New Message:    This message came in Pt's Schedule calls this morning. Please call to evaluate.      ThI'm having unusual chest pains , My BMI Not normal, Blood pressure Stays over109 on the bottom I'm having Headaches

## 2020-07-28 NOTE — Telephone Encounter (Signed)
      I made pt an appointment for Monday(07-31-20) with Dr Audie Box.

## 2020-07-28 NOTE — Telephone Encounter (Signed)
Returned the call to the patient. He stated that he has been having intermittent pains in the chest area that feel likes shocks, headaches and elevated blood pressures for the past two weeks. He stated that the pain is not a chest pain but more of a shock feeling that last a few seconds and are not consistent.   Previous stress test and echo were normal.   Blood pressure this morning was 151/104 and heart rate was 87. He stated that his blood pressure have been elevated with diastolic in the 798'X for the past two weeks as well.  He has a follow up on 11/1 with Dr. Audie Box. He has been advised that if symptoms do not improve and the chest pain worsens or becomes consistent to seek further assessment at an ED or Urgent Care.

## 2020-07-30 NOTE — Progress Notes (Signed)
Cardiology Office Note:   Date:  07/31/2020  NAME:  Gene Anderson    MRN: [id number] DOB:  31-May-1961   PCP:  Gene Ebbs, MD  Cardiologist:  Gene Field, MD  Electrophysiologist:  None   Referring MD: Gene Ebbs, MD   Chief Complaint  Patient presents with  . Chest Pain   History of Present Illness:   Gene Anderson is a 59 y.o. male with a hx of atrial fibrillation, tobacco abuse, HTN, HLD who presents for follow-up of CP. Had normal echo and stress in January.  He reports for the last 2 weeks has had high blood pressure.  Apparently blood pressure values have ranged in the 170 mmHg range.  He reports he is also had some increase shortness of breath.  He is also had heart palpitations at night.  He is also has sharp chest pain when he lays down at night.  He reports his heart racing can last several seconds.  He reports has been monitoring his heart rate on his apple watch.  He does not have the one that does EKGs.  His EKG today demonstrates sinus tachycardia with no acute ischemic changes or evidence of prior infarction.  He was started on amlodipine by his primary care physician.  Blood pressure is 139/82.  He has noticed some improvement with his blood pressure but his symptoms still persist.  He said no further recurrence of atrial fibrillation I can tell but he has had this in the past.  He is only taking metoprolol as needed.  He does need updated labs as well as a TSH.  He apparently has sleep apnea but is not using his machine.  He presents with his wife today.  He does snore and has apneic episodes.  He was told to use the machine but could not.  He does request referral to a sleep medicine professional.  I do agree with this.  He has not having any chest pain or shortness of breath when he exerts himself.  Symptoms mainly occur at night and are associated with high blood pressure.  Problem List: 1. Atrial fibrillation s/p TEE/DCCV 2015 -no further recurrence (pill in  pocket strategy) -CHADSVASC=1 2. Tobacco abuse -40 pack years 3. Hypertension 4. Hyperlipidemia 5. OSA  Past Medical History: Past Medical History:  Diagnosis Date  . Arrhythmia   . Atrial fibrillation ([place])   . Cataract    OD  . Diverticulosis   . Fatty liver 06/20/13  . Hyperplastic colon polyp   . Hypertension   . Internal hemorrhoids   . Leukopenia   . Neck pain   . Prostate enlargement   . Spinal stenosis     Past Surgical History: Past Surgical History:  Procedure Laterality Date  . CARDIOVERSION  03/2014  . CATARACT EXTRACTION Left 05/14/2019   Dr. Wyatt Anderson  . EYE SURGERY    . Flexible cystoscopy and repair of torn tunica albuginea    . ULNAR NERVE TRANSPOSITION Left 08/26/2017   Procedure: LEFT ELBOW ULNAR NEUROLYSIS;  Surgeon: Gene Oto, MD;  Location: [facility];  Service: Orthopedics;  Laterality: Left;    Current Medications: Current Meds  Medication Sig  . amLODipine (NORVASC) 10 MG tablet Take 10 mg by mouth daily.  . diclofenac (VOLTAREN) 75 MG EC tablet TAKE 1 TABLET(75 MG) BY MOUTH TWICE DAILY  . diclofenac sodium (VOLTAREN) 1 % GEL APPLY 2 GRAMS EXTERNALLY TO THE AFFECTED AREA FOUR TIMES DAILY AS NEEDED  FOR PAIN  . naphazoline-pheniramine (NAPHCON-A) 0.025-0.3 % ophthalmic solution Place 1 drop into both eyes 2 (two) times daily as needed for irritation or allergies (USES IN THE MORNING).   Marland Kitchen Oxycodone HCl 20 MG TABS Take 20 m by mouth every 6 (six) hours.  . rosuvastatin (CRESTOR) 20 MG tablet Take 1 tablet (20 mg total) by mouth daily.  Marland Kitchen terbinafine (LAMISIL) 250 MG tablet Take 1 tablet (250 mg total) by mouth daily.     Allergies:    Augmentin [amoxicillin-pot clavulanate]   Social History: Social History   Socioeconomic History  . Marital status: Married    Spouse name: Not on file  . Number of children: 4  . Years of education: 5  . Highest education level: Not on file  Occupational History    Employer:  [employer]  Tobacco Use  . Smoking status: Current Every Day Smoker    Packs/day: 0.75    Years: 40.00    Pack years: 30.00    Types: Cigarettes  . Smokeless tobacco: Never Used  Vaping Use  . Vaping Use: Never used  Substance and Sexual Activity  . Alcohol use: Yes    Comment: social  . Drug use: No  . Sexual activity: Not on file  Other Topics Concern  . Not on file  Social History Narrative   Patient lives at home with his girl friend.   Patient is not working at this time.   Right handed.   Education college education.   Caffeine None   Social Determinants of Health   Financial Resource Strain:   . Difficulty of Paying Living Expenses: Not on file  Food Insecurity:   . Worried About Charity fundraiser in the Last Year: Not on file  . Ran Out of Food in the Last Year: Not on file  Transportation Needs:   . Lack of Transportation (Medical): Not on file  . Lack of Transportation (Non-Medical): Not on file  Physical Activity:   . Days of Exercise per Week: Not on file  . Minutes of Exercise per Session: Not on file  Stress:   . Feeling of Stress : Not on file  Social Connections:   . Frequency of Communication with Friends and Family: Not on file  . Frequency of Social Gatherings with Friends and Family: Not on file  . Attends Religious Services: Not on file  . Active Member of Clubs or Organizations: Not on file  . Attends Archivist Meetings: Not on file  . Marital Status: Not on file     Family History: The patient's family history includes Cancer in his father and paternal grandmother; Hypertension in his mother; Stroke in his maternal grandfather.  ROS:   All other ROS reviewed and negative. Pertinent positives noted in the HPI.     EKGs/Labs/Other Studies Reviewed:   The following studies were personally reviewed by me today:  EKG:  EKG is ordered today.  The ekg ordered today demonstrates sinus tachycardia, heart rate 104, no acute  ischemic changes, no evidence of prior infarction, and was personally reviewed by me.   TTE 10/26/2019 1. Left ventricular ejection fraction, by visual estimation, is 60 to  65%. The left ventricle has normal function. There is no left ventricular  hypertrophy.  2. The left ventricle has no regional wall motion abnormalities.  3. Normal GLS -16.7.  4. Global right ventricle has normal systolic function.The right  ventricular size is normal. No increase in right  ventricular wall  thickness.  5. Left atrial size was moderately dilated.  6. Right atrial size was normal.  7. The mitral valve is normal in structure. Trivial mitral valve  regurgitation. No evidence of mitral stenosis.  8. The tricuspid valve is normal in structure.  9. The tricuspid valve is normal in structure. Tricuspid valve  regurgitation is mild.  10. The aortic valve is tricuspid. Aortic valve regurgitation is not  visualized. Mild aortic valve sclerosis without stenosis.  11. The pulmonic valve was normal in structure. Pulmonic valve  regurgitation is trivial.  12. Aortic dilatation noted.  13. There is mild dilatation of the aortic root measuring 38 mm.  14. Normal pulmonary artery systolic pressure.  15. The inferior vena cava is normal in size with greater than 50%  respiratory variability, suggesting right atrial pressure of 3 mmHg.   NM Stress 10/08/2019  Probable normal perfusion and minimal soft tissue attenuation (diaphragm) No significant ischemia or scar.  Nuclear stress EF: 44%. cconsider echo to further define LVEF/wall motion.  There was no ST segment deviation noted during stress.  This is an intermediate risk study.     Recent Labs: No results found for requested labs within last 8760 hours.   Recent Lipid Panel No results found for: CHOL, TRIG, HDL, CHOLHDL, VLDL, LDLCALC, LDLDIRECT  Physical Exam:   VS:  BP 139/82   Pulse (!) 104   Ht 6\' 1"  (1.854 m)   Wt 225 lb 3.2 oz (102.2  kg)   SpO2 98%   BMI 29.71 kg/m    Wt Readings from Last 3 Encounters:  07/31/20 225 lb 3.2 oz (102.2 kg)  01/05/20 223 lb (101.2 kg)  11/25/19 225 lb (102.1 kg)    General: Well nourished, well developed, in no acute distress Heart: Atraumatic, normal size  Eyes: PEERLA, EOMI  Neck: Supple, no JVD Endocrine: No thryomegaly Cardiac: Normal S1, S2; RRR; no murmurs, rubs, or gallops Lungs: Clear to auscultation bilaterally, no wheezing, rhonchi or rales  Abd: Soft, nontender, no hepatomegaly  Ext: No edema, pulses 2+ Musculoskeletal: No deformities, BUE and BLE strength normal and equal Skin: Warm and dry, no rashes   Neuro: Alert and oriented to person, place, time, and situation, CNII-XII grossly intact, no focal deficits  Psych: Normal mood and affect   ASSESSMENT:   Gene Anderson is a 59 y.o. male who presents for the following: 1. Palpitations   2. Chest pain, unspecified type   3. Paroxysmal atrial fibrillation (HCC)   4. Essential hypertension   5. Mixed hyperlipidemia   6. OSA (obstructive sleep apnea)     PLAN:   1. Palpitations 2. Chest pain, unspecified type 3. Paroxysmal atrial fibrillation (HCC) 4. Essential hypertension -He said 2 weeks of high blood pressure, atypical chest pain and palpitations.  I suspect his symptoms are just related to uncontrolled hypertension.  He is on Norvasc 10 mg daily.  Blood pressure is much better.  Symptoms of started to improve.  He has had no recurrence of atrial fibrillation but did have this in the past.  I have recommended we check repeat labs on him as well.  He needs a BMP as well as TSH.  I would also like to proceed with a 7-day Zio patch to exclude any significant arrhythmia.  His chest pain is atypical and he recently had a normal cardiac stress test.  His echocardiogram was normal as well.  I suspect his symptoms could be related to accelerated hypertension.  He also has sleep apnea and is not treated for this.  His wife  reports he does snore.  I do wonder if sleep apnea are causing his symptoms.  He falls asleep easily and his symptoms do occur at night.  I would like for him to be reevaluated by Dr. Golden Hurter for new sleep machine.  He may be a candidate for one of the newer devices.  Given that his stress test was normal I do not feel strongly about repeating this.  I think more importantly need to get his blood pressure under control and then get his sleep apnea treated.  I will plan to see him back in 6 weeks to see if symptoms improve with BP control.  We could consider cardiac CTA if symptoms do not improve.  5. Mixed hyperlipidemia -Needs repeat lipid profile  6. OSA (obstructive sleep apnea) -He was diagnosed with this in the past.  Needs to be reevaluated by sleep medicine.  Referral to Dr. Golden Hurter.  Disposition: Return in about 6 weeks (around 09/11/2020).  Medication Adjustments/Labs and Tests Ordered: Current medicines are reviewed at length with the patient today.  Concerns regarding medicines are outlined above.  Orders Placed This Encounter  Procedures  . Basic metabolic panel  . TSH  . LONG TERM MONITOR (3-14 DAYS)  . EKG 12-Lead   No orders of the defined types were placed in this encounter.   Patient Instructions  Medication Instructions:  Continue same medications *If you need a refill on your cardiac medications before your next appointment, please call your pharmacy*   Lab Work: Bmet,tsh   Testing/Procedures: 7 day Zio Monitor   Follow-Up: At [facility], you and your health needs are our priority.  As part of our continuing mission to provide you with exceptional heart care, we have created designated Provider Care Teams.  These Care Teams include your primary Cardiologist (physician) and Advanced Practice Providers (APPs -  Physician Assistants and Nurse Practitioners) who all work together to provide you with the care you need, when you need it.  We recommend  signing up for the patient portal called "MyChart".  Sign up information is provided on this After Visit Summary.  MyChart is used to connect with patients for Virtual Visits (Telemedicine).  Patients are able to view lab/test results, encounter notes, upcoming appointments, etc.  Non-urgent messages can be sent to your provider as well.   To learn more about what you can do with MyChart, go to [email or web address].    Your next appointment:  6 weeks   The format for your next appointment: Office   Provider:  Dr.O'Neal   Schedule appointment with Dr.Tracy Turner for sleep apnea       Time Spent with Patient: I have spent a total of 35 minutes with patient reviewing hospital notes, telemetry, EKGs, labs and examining the patient as well as establishing an assessment and plan that was discussed with the patient.  > 50% of time was spent in direct patient care.  Signed, Addison Naegeli. Audie Box, [place]  [address] [place], [place] [address] [phone]  07/31/2020 5:13 PM

## 2020-07-31 ENCOUNTER — Encounter: Payer: Self-pay | Admitting: Cardiovascular Disease

## 2020-07-31 ENCOUNTER — Ambulatory Visit (INDEPENDENT_AMBULATORY_CARE_PROVIDER_SITE_OTHER): Payer: Medicaid Other | Admitting: Cardiovascular Disease

## 2020-07-31 ENCOUNTER — Other Ambulatory Visit: Payer: Self-pay

## 2020-07-31 VITALS — BP 139/82 | HR 104 | Ht 73.0 in | Wt 225.2 lb

## 2020-07-31 DIAGNOSIS — I480 Paroxysmal atrial fibrillation: Secondary | ICD-10-CM

## 2020-07-31 DIAGNOSIS — R079 Chest pain, unspecified: Secondary | ICD-10-CM | POA: Diagnosis not present

## 2020-07-31 DIAGNOSIS — R002 Palpitations: Secondary | ICD-10-CM | POA: Diagnosis not present

## 2020-07-31 DIAGNOSIS — I10 Essential (primary) hypertension: Secondary | ICD-10-CM

## 2020-07-31 DIAGNOSIS — G4733 Obstructive sleep apnea (adult) (pediatric): Secondary | ICD-10-CM

## 2020-07-31 DIAGNOSIS — E782 Mixed hyperlipidemia: Secondary | ICD-10-CM

## 2020-07-31 NOTE — Patient Instructions (Signed)
Medication Instructions:  Continue same medications *If you need a refill on your cardiac medications before your next appointment, please call your pharmacy*   Lab Work: Bmet,tsh   Testing/Procedures: 7 day Zio Monitor   Follow-Up: At [facility], you and your health needs are our priority.  As part of our continuing mission to provide you with exceptional heart care, we have created designated Provider Care Teams.  These Care Teams include your primary Cardiologist (physician) and Advanced Practice Providers (APPs -  Physician Assistants and Nurse Practitioners) who all work together to provide you with the care you need, when you need it.  We recommend signing up for the patient portal called "MyChart".  Sign up information is provided on this After Visit Summary.  MyChart is used to connect with patients for Virtual Visits (Telemedicine).  Patients are able to view lab/test results, encounter notes, upcoming appointments, etc.  Non-urgent messages can be sent to your provider as well.   To learn more about what you can do with MyChart, go to [email or web address].    Your next appointment:  6 weeks   The format for your next appointment: Office   Provider:  Dr.O'Neal   Schedule appointment with Dr.Tracy Turner for sleep apnea

## 2020-08-01 ENCOUNTER — Other Ambulatory Visit: Payer: Self-pay

## 2020-08-01 DIAGNOSIS — G4733 Obstructive sleep apnea (adult) (pediatric): Secondary | ICD-10-CM

## 2020-08-02 ENCOUNTER — Telehealth: Payer: Self-pay | Admitting: Cardiovascular Disease

## 2020-08-02 DIAGNOSIS — G4733 Obstructive sleep apnea (adult) (pediatric): Secondary | ICD-10-CM

## 2020-08-02 NOTE — Telephone Encounter (Signed)
Patient has a referral in but has not had a sleep study in a long time and does not have a CPAP machine.

## 2020-08-02 NOTE — Telephone Encounter (Signed)
Dr Marisue Ivan sent a referral for split night. It was sent to sleep pool for precert.

## 2020-08-06 ENCOUNTER — Other Ambulatory Visit (INDEPENDENT_AMBULATORY_CARE_PROVIDER_SITE_OTHER): Payer: Medicaid Other

## 2020-08-06 DIAGNOSIS — G4733 Obstructive sleep apnea (adult) (pediatric): Secondary | ICD-10-CM

## 2020-08-06 DIAGNOSIS — I480 Paroxysmal atrial fibrillation: Secondary | ICD-10-CM

## 2020-08-06 DIAGNOSIS — R079 Chest pain, unspecified: Secondary | ICD-10-CM

## 2020-08-06 DIAGNOSIS — I10 Essential (primary) hypertension: Secondary | ICD-10-CM

## 2020-08-06 DIAGNOSIS — R002 Palpitations: Secondary | ICD-10-CM

## 2020-08-06 DIAGNOSIS — E782 Mixed hyperlipidemia: Secondary | ICD-10-CM

## 2020-08-09 NOTE — Telephone Encounter (Signed)
Auth submitted, Tracking ID [id number]

## 2020-08-17 ENCOUNTER — Ambulatory Visit: Payer: Medicaid Other | Admitting: Cardiovascular Disease

## 2020-09-17 NOTE — Progress Notes (Signed)
Cardiology Office Note:   Date:  09/19/2020  NAME:  Gene Anderson    MRN: [id number] DOB:  1961/04/23   PCP:  Nolene Ebbs, MD  Cardiologist:  Evalina Field, MD   Referring MD: Nolene Ebbs, MD   Chief Complaint  Patient presents with   Follow-up    History of Present Illness:   Gene Anderson is a 59 y.o. male with a hx of Afib, tobacco abuse, HTN who presents for follow-up. Seen 07/31/2020 for increased BP/chest pain. Symptoms thought to be BP related. BP improved with norvasc. Referred back to sleep medicine.   He reports he still getting sharp episodes of chest pain.  They occur mainly at night.  They last seconds and resolve.  Symptoms have improved with blood pressure control.  He is still smoking but working on quitting.  He had a normal stress test earlier this year.  Also had a normal echo.  Monitor showed no further recurrence of atrial fibrillation.  He has going to have an in lab sleep study for CPAP titration in January.  I think his symptoms likely are sleep apnea related.  BMI is within normal limits.  Blood pressure today 138/84.  Almost at goal.  Much improved from prior.  He does need a repeat lipid profile.  He will have lab work by his primary care physician I will see him back in 1 year.  Problem List: 1. Atrial fibrillation s/p TEE/DCCV 2015 -no further recurrence (pill in pocket strategy) -CHADSVASC=1 -no recurrence on monitor 07/2020 2. Tobacco abuse -40 pack years 3. Hypertension 4. Hyperlipidemia 5. OSA 6. Chest pain -normal echo -normal MPI (10/2019)  Past Medical History: Past Medical History:  Diagnosis Date   Arrhythmia    Atrial fibrillation ([place])    Cataract    OD   Diverticulosis    Fatty liver 06/20/13   Hyperplastic colon polyp    Hypertension    Internal hemorrhoids    Leukopenia    Neck pain    Prostate enlargement    Spinal stenosis     Past Surgical History: Past Surgical History:  Procedure Laterality  Date   CARDIOVERSION  03/2014   CATARACT EXTRACTION Left 05/14/2019   Dr. Wyatt Portela   EYE SURGERY     Flexible cystoscopy and repair of torn tunica albuginea     ULNAR NERVE TRANSPOSITION Left 08/26/2017   Procedure: LEFT ELBOW ULNAR NEUROLYSIS;  Surgeon: Jessy Oto, MD;  Location: [facility];  Service: Orthopedics;  Laterality: Left;    Current Medications: Current Meds  Medication Sig   amLODipine (NORVASC) 10 MG tablet Take 10 mg by mouth daily.   diclofenac (VOLTAREN) 75 MG EC tablet TAKE 1 TABLET(75 MG) BY MOUTH TWICE DAILY   diclofenac sodium (VOLTAREN) 1 % GEL APPLY 2 GRAMS EXTERNALLY TO THE AFFECTED AREA FOUR TIMES DAILY AS NEEDED FOR PAIN   naphazoline-pheniramine (NAPHCON-A) 0.025-0.3 % ophthalmic solution Place 1 drop into both eyes 2 (two) times daily as needed for irritation or allergies (USES IN THE MORNING).    Oxycodone HCl 20 MG TABS Take 20 m by mouth every 6 (six) hours.   terbinafine (LAMISIL) 250 MG tablet Take 1 tablet (250 mg total) by mouth daily.     Allergies:    Augmentin [amoxicillin-pot clavulanate]   Social History: Social History   Socioeconomic History   Marital status: Married    Spouse name: Not on file   Number of children: 4  Years of education: 66   Highest education level: Not on file  Occupational History    Employer: [employer]  Tobacco Use   Smoking status: Current Every Day Smoker    Packs/day: 0.75    Years: 40.00    Pack years: 30.00    Types: Cigarettes   Smokeless tobacco: Never Used  Vaping Use   Vaping Use: Never used  Substance and Sexual Activity   Alcohol use: Yes    Comment: social   Drug use: No   Sexual activity: Not on file  Other Topics Concern   Not on file  Social History Narrative   Patient lives at home with his girl friend.   Patient is not working at this time.   Right handed.   Education college education.   Caffeine None   Social Determinants  of Health   Financial Resource Strain: Not on file  Food Insecurity: Not on file  Transportation Needs: Not on file  Physical Activity: Not on file  Stress: Not on file  Social Connections: Not on file     Family History: The patient's family history includes Cancer in his father and paternal grandmother; Hypertension in his mother; Stroke in his maternal grandfather.  ROS:   All other ROS reviewed and negative. Pertinent positives noted in the HPI.     EKGs/Labs/Other Studies Reviewed:   The following studies were personally reviewed by me today:  [facility] 08/27/2020  1. No arrhythmias detected.  2. Rare ectopy.   TTE 10/26/2019  1. Left ventricular ejection fraction, by visual estimation, is 60 to  65%. The left ventricle has normal function. There is no left ventricular  hypertrophy.  2. The left ventricle has no regional wall motion abnormalities.  3. Normal GLS -16.7.  4. Global right ventricle has normal systolic function.The right  ventricular size is normal. No increase in right ventricular wall  thickness.  5. Left atrial size was moderately dilated.  6. Right atrial size was normal.  7. The mitral valve is normal in structure. Trivial mitral valve  regurgitation. No evidence of mitral stenosis.  8. The tricuspid valve is normal in structure.  9. The tricuspid valve is normal in structure. Tricuspid valve  regurgitation is mild.  10. The aortic valve is tricuspid. Aortic valve regurgitation is not  visualized. Mild aortic valve sclerosis without stenosis.  11. The pulmonic valve was normal in structure. Pulmonic valve  regurgitation is trivial.  12. Aortic dilatation noted.  13. There is mild dilatation of the aortic root measuring 38 mm.  14. Normal pulmonary artery systolic pressure.  15. The inferior vena cava is normal in size with greater than 50%  respiratory variability, suggesting right atrial pressure of 3 mmHg.   NM Stress 10/07/2019 Normal    Recent Labs: No results found for requested labs within last 8760 hours.   Recent Lipid Panel No results found for: CHOL, TRIG, HDL, CHOLHDL, VLDL, LDLCALC, LDLDIRECT  Physical Exam:   VS:  BP 138/84    Pulse 83    Ht 6\' 2"  (1.88 m)    Wt 225 lb 6.4 oz (102.2 kg)    SpO2 96%    BMI 28.94 kg/m    Wt Readings from Last 3 Encounters:  09/19/20 225 lb 6.4 oz (102.2 kg)  07/31/20 225 lb 3.2 oz (102.2 kg)  01/05/20 223 lb (101.2 kg)    General: Well nourished, well developed, in no acute distress Head: Atraumatic, normal size  Eyes: PEERLA,  EOMI  Neck: Supple, no JVD Endocrine: No thryomegaly Cardiac: Normal S1, S2; RRR; no murmurs, rubs, or gallops Lungs: Clear to auscultation bilaterally, no wheezing, rhonchi or rales  Abd: Soft, nontender, no hepatomegaly  Ext: No edema, pulses 2+ Musculoskeletal: No deformities, BUE and BLE strength normal and equal Skin: Warm and dry, no rashes   Neuro: Alert and oriented to person, place, time, and situation, CNII-XII grossly intact, no focal deficits  Psych: Normal mood and affect   ASSESSMENT:   Gene Anderson is a 58 y.o. male who presents for the following: 1. Essential hypertension   2. Paroxysmal atrial fibrillation (HCC)   3. Mixed hyperlipidemia   4. Other chest pain     PLAN:   1. Essential hypertension -Blood pressure much improved on amlodipine 10 mg a day.  He will watch his salt intake.  I encouraged exercise 2.  He needs to quit smoking.  2. Paroxysmal atrial fibrillation (HCC) -Atrial fibrillation in the past.  He has had no further recurrence since 2015.  He was pursuing a pill in pocket strategy but since has had no further recurrence no need to do this. -CHADSVASC=1.  No anticoagulation.  3. Mixed hyperlipidemia -Continue statin.  Needs a repeat lipid profile.  4. Other chest pain -Atypical chest pain.  Sharp.  Normal stress MPI in January 2021.  Normal echo.  Recent monitor with no recurrence of atrial  fibrillation.  I suspect his symptoms of sleep apnea related.  He will undergo in lab titration in early January.  Disposition: Return in about 1 year (around 09/19/2021).  Medication Adjustments/Labs and Tests Ordered: Current medicines are reviewed at length with the patient today.  Concerns regarding medicines are outlined above.  No orders of the defined types were placed in this encounter.  No orders of the defined types were placed in this encounter.   Patient Instructions  Medication Instructions:  The current medical regimen is effective;  continue present plan and medications.  *If you need a refill on your cardiac medications before your next appointment, please call your pharmacy*  Follow-Up: At [facility], you and your health needs are our priority.  As part of our continuing mission to provide you with exceptional heart care, we have created designated Provider Care Teams.  These Care Teams include your primary Cardiologist (physician) and Advanced Practice Providers (APPs -  Physician Assistants and Nurse Practitioners) who all work together to provide you with the care you need, when you need it.  We recommend signing up for the patient portal called "MyChart".  Sign up information is provided on this After Visit Summary.  MyChart is used to connect with patients for Virtual Visits (Telemedicine).  Patients are able to view lab/test results, encounter notes, upcoming appointments, etc.  Non-urgent messages can be sent to your provider as well.   To learn more about what you can do with MyChart, go to [email or web address].    Your next appointment:   12 month(s)  The format for your next appointment:   In Person  Provider:   Eleonore Chiquito, MD        Time Spent with Patient: I have spent a total of 25 minutes with patient reviewing hospital notes, telemetry, EKGs, labs and examining the patient as well as establishing an assessment and plan that was discussed  with the patient.  > 50% of time was spent in direct patient care.  Signed, Addison Naegeli. Audie Box, [place]  [address], [place], [place] [address] [phone]  09/19/2020 9:16 AM

## 2020-09-18 ENCOUNTER — Telehealth: Payer: Self-pay | Admitting: Physical Medicine and Rehabilitation

## 2020-09-18 NOTE — Telephone Encounter (Signed)
Patient called needing an appointment with Dr Ernestina Patches for his back     The number to contact patient is [phone]

## 2020-09-18 NOTE — Telephone Encounter (Signed)
He might want to follow with [name], or we can do repeat of last and talk about MBB, ask about if he is seeing Dr. Judi Cong (also reiterate we do not do chronic opioid pain medications)

## 2020-09-18 NOTE — Telephone Encounter (Signed)
Left L4 TF on 01/06/20. Patient had OV with Dr. Colin Mulders at [facility] who recommended MBB. Please advise.

## 2020-09-19 ENCOUNTER — Other Ambulatory Visit: Payer: Self-pay

## 2020-09-19 ENCOUNTER — Ambulatory Visit (INDEPENDENT_AMBULATORY_CARE_PROVIDER_SITE_OTHER): Payer: Medicaid Other | Admitting: Cardiovascular Disease

## 2020-09-19 ENCOUNTER — Telehealth: Payer: Self-pay | Admitting: Physical Medicine and Rehabilitation

## 2020-09-19 ENCOUNTER — Encounter: Payer: Self-pay | Admitting: Cardiovascular Disease

## 2020-09-19 VITALS — BP 138/84 | HR 83 | Ht 74.0 in | Wt 225.4 lb

## 2020-09-19 DIAGNOSIS — R0789 Other chest pain: Secondary | ICD-10-CM | POA: Diagnosis not present

## 2020-09-19 DIAGNOSIS — I480 Paroxysmal atrial fibrillation: Secondary | ICD-10-CM

## 2020-09-19 DIAGNOSIS — I10 Essential (primary) hypertension: Secondary | ICD-10-CM

## 2020-09-19 DIAGNOSIS — E782 Mixed hyperlipidemia: Secondary | ICD-10-CM | POA: Diagnosis not present

## 2020-09-19 NOTE — Telephone Encounter (Signed)
Left message #1

## 2020-09-19 NOTE — Telephone Encounter (Signed)
Pt called returning Courtneys call and states he will available until 11 a.m and then available again this afternoon after 1  [phone]

## 2020-09-19 NOTE — Telephone Encounter (Signed)
See previous message

## 2020-09-19 NOTE — Patient Instructions (Signed)

## 2020-09-19 NOTE — Telephone Encounter (Signed)
Scheduled with Dr. Louanne Skye.

## 2020-09-19 NOTE — Telephone Encounter (Signed)
Scheduled with Dr. Ernestina Patches.

## 2020-10-09 ENCOUNTER — Ambulatory Visit (HOSPITAL_BASED_OUTPATIENT_CLINIC_OR_DEPARTMENT_OTHER): Payer: Medicaid Other | Attending: Cardiovascular Disease | Admitting: Cardiology

## 2020-10-09 ENCOUNTER — Other Ambulatory Visit: Payer: Self-pay

## 2020-10-19 ENCOUNTER — Ambulatory Visit (INDEPENDENT_AMBULATORY_CARE_PROVIDER_SITE_OTHER): Payer: Medicaid Other | Admitting: Specialist

## 2020-10-19 ENCOUNTER — Encounter: Payer: Self-pay | Admitting: Specialist

## 2020-10-19 ENCOUNTER — Other Ambulatory Visit: Payer: Self-pay

## 2020-10-19 ENCOUNTER — Ambulatory Visit: Payer: Self-pay

## 2020-10-19 ENCOUNTER — Ambulatory Visit (INDEPENDENT_AMBULATORY_CARE_PROVIDER_SITE_OTHER): Payer: Medicaid Other

## 2020-10-19 VITALS — BP 169/119 | HR 89 | Ht 74.0 in | Wt 225.5 lb

## 2020-10-19 DIAGNOSIS — M4316 Spondylolisthesis, lumbar region: Secondary | ICD-10-CM | POA: Diagnosis not present

## 2020-10-19 DIAGNOSIS — G5622 Lesion of ulnar nerve, left upper limb: Secondary | ICD-10-CM | POA: Diagnosis not present

## 2020-10-19 DIAGNOSIS — M170 Bilateral primary osteoarthritis of knee: Secondary | ICD-10-CM | POA: Diagnosis not present

## 2020-10-19 DIAGNOSIS — M48062 Spinal stenosis, lumbar region with neurogenic claudication: Secondary | ICD-10-CM

## 2020-10-19 DIAGNOSIS — M1711 Unilateral primary osteoarthritis, right knee: Secondary | ICD-10-CM

## 2020-10-19 DIAGNOSIS — M1712 Unilateral primary osteoarthritis, left knee: Secondary | ICD-10-CM

## 2020-10-19 MED ORDER — BUPIVACAINE HCL 0.5 % IJ SOLN
3.0000 mL | INTRAMUSCULAR | Status: AC | PRN
  Administered 2020-10-19: 3 mL via INTRA_ARTICULAR

## 2020-10-19 MED ORDER — METHYLPREDNISOLONE ACETATE 40 MG/ML IJ SUSP
40.0000 mg | INTRAMUSCULAR | Status: AC | PRN
  Administered 2020-10-19: 40 mg via INTRA_ARTICULAR

## 2020-10-19 NOTE — Patient Instructions (Addendum)
Plan: Knee is suffering from osteoarthritis, only real proven treatments are Weight loss, Do not take NSIADs like diclofenac, motrin or naprosyn and exercise. Well padded shoes help. Ice the knee that  is suffering from osteoarthritis, only real proven treatments are  Ice the knee 2-3 times a day 15-20 mins at a time.-3 times a day 15-20 mins at a time. Hot showers in the AM.  Injection with steroid may be of benefit. Hemp CBD capsules, amazon.com 5,000-7,000 mg per bottle, 60 capsules per bottle, take one capsule twice a day. Cane in the left hand to use with left leg weight bearing. Call if pain in the knees worsens and we will set up an appt to see Dr. Ninfa Linden to discuss considering knee intervention.  EMG/NCV of the left elbow to assess for persistent cubital tunnel findings.  Follow-Up Instructions: No follow-ups on file.   Follow-Up Instructions: No follow-ups on file.

## 2020-10-19 NOTE — Progress Notes (Signed)
Office Visit Note   Patient: Gene Anderson           Date of Birth: 13-Aug-1961           MRN: [id number] Visit Date: 10/19/2020              Requested by: Nolene Ebbs, MD [address] [place],  [place] [address] PCP: Nolene Ebbs, MD   Assessment & Plan: Visit Diagnoses:  1. Spinal stenosis of lumbar region with neurogenic claudication   2. Unilateral primary osteoarthritis, left knee   3. Unilateral primary osteoarthritis, right knee    Plan: Knee is suffering from osteoarthritis, only real proven treatments are Weight loss, Do not take NSIADs like diclofenac, motrin or naprosyn and exercise. Well padded shoes help. Ice the knee that  is suffering from osteoarthritis, only real proven treatments are  Ice the knee 2-3 times a day 15-20 mins at a time.-3 times a day 15-20 mins at a time. Hot showers in the AM.  Injection with steroid may be of benefit. Hemp CBD capsules, amazon.com 5,000-7,000 mg per bottle, 60 capsules per bottle, take one capsule twice a day. Cane in the left hand to use with left leg weight bearing. Call if pain in the knees worsens and we will set up an appt to see Dr. Ninfa Linden to discuss considering knee intervention.  Follow-Up Instructions: No follow-ups on file.   Follow-Up Instructions: No follow-ups on file. .   Follow-Up Instructions: No follow-ups on file.   Orders:  Orders Placed This Encounter  Procedures  . Large Joint Inj: bilateral knee  . XR Lumbar Spine 2-3 Views  . XR Knee 1-2 Views Left  . XR Knee 1-2 Views Right   No orders of the defined types were placed in this encounter.     Procedures: Large Joint Inj: bilateral knee on 10/19/2020 3:57 PM Indications: pain Details: 25 G 1.5 in needle, anterolateral approach  Arthrogram: No  Medications (Right): 3 mL bupivacaine 0.5 %; 40 mg methylPREDNISolone acetate 40 MG/ML Aspirate (Right): clear Medications (Left): 3 mL bupivacaine 0.5 %; 40 mg methylPREDNISolone acetate  40 MG/ML Aspirate (Left): clear Outcome: tolerated well, no immediate complications  bandaids applied.  Procedure, treatment alternatives, risks and benefits explained, specific risks discussed. Consent was given by the patient. Immediately prior to procedure a time out was called to verify the correct patient, procedure, equipment, support staff and site/side marked as required. Patient was prepped and draped in the usual sterile fashion.       Clinical Data: No additional findings.   Subjective: Chief Complaint  Patient presents with  . Lower Back - Follow-up, Pain  . Right Knee - Pain    Wants injections  . Left Knee - Pain    Wants injections    60 year old male with history of lumbar degenerative disc disease and he is being seen in pain management and he is also having pain due to DJD of the knees especially the P-F joint. He has pain in the knees with bending, squatting and kneeling. First in the AM right knee is painful and awakens him. No bowel or bladder difficulty. Advised not to use NSAIDs due to hypertension and   Review of Systems   Objective: Vital Signs: BP (!) 169/119   Pulse 89   Ht 6\' 2"  (1.88 m)   Wt 225 lb 8 oz (102.3 kg)   BMI 28.95 kg/m   Physical Exam  Ortho Exam  Specialty Comments:  No specialty comments available.  Imaging: No results found.   PMFS History: Patient Active Problem List   Diagnosis Date Noted  . Cubital tunnel syndrome on left 08/26/2017    Priority: High    Class: Chronic  . Atrial fibrillation ([place]) 04/18/2014  . Paresthesia 02/16/2014  . Neck pain 02/16/2014  . Leukopenia 02/16/2013   Past Medical History:  Diagnosis Date  . Arrhythmia   . Atrial fibrillation ([place])   . Cataract    OD  . Diverticulosis   . Fatty liver 06/20/13  . Hyperplastic colon polyp   . Hypertension   . Internal hemorrhoids   . Leukopenia   . Neck pain   . Prostate enlargement   . Spinal stenosis     Family History  Problem  Relation Age of Onset  . Hypertension Mother   . Cancer Father   . Stroke Maternal Grandfather   . Cancer Paternal Grandmother     Past Surgical History:  Procedure Laterality Date  . CARDIOVERSION  03/2014  . CATARACT EXTRACTION Left 05/14/2019   Dr. Wyatt Portela  . EYE SURGERY    . Flexible cystoscopy and repair of torn tunica albuginea    . ULNAR NERVE TRANSPOSITION Left 08/26/2017   Procedure: LEFT ELBOW ULNAR NEUROLYSIS;  Surgeon: Jessy Oto, MD;  Location: [facility];  Service: Orthopedics;  Laterality: Left;   Social History   Occupational History    Employer: [employer]  Tobacco Use  . Smoking status: Current Every Day Smoker    Packs/day: 0.75    Years: 40.00    Pack years: 30.00    Types: Cigarettes  . Smokeless tobacco: Never Used  Vaping Use  . Vaping Use: Never used  Substance and Sexual Activity  . Alcohol use: Yes    Comment: social  . Drug use: No  . Sexual activity: Not on file

## 2020-10-27 ENCOUNTER — Ambulatory Visit (HOSPITAL_BASED_OUTPATIENT_CLINIC_OR_DEPARTMENT_OTHER): Payer: Medicaid Other | Admitting: Cardiology

## 2020-11-02 ENCOUNTER — Ambulatory Visit (HOSPITAL_BASED_OUTPATIENT_CLINIC_OR_DEPARTMENT_OTHER): Payer: Medicaid Other | Attending: Cardiovascular Disease | Admitting: Cardiology

## 2020-11-02 ENCOUNTER — Other Ambulatory Visit: Payer: Self-pay

## 2020-11-02 DIAGNOSIS — G4733 Obstructive sleep apnea (adult) (pediatric): Secondary | ICD-10-CM | POA: Diagnosis present

## 2020-11-13 ENCOUNTER — Encounter: Payer: Self-pay | Admitting: Gastroenterology

## 2020-11-13 NOTE — Procedures (Signed)
   Patient Name: Gene, Anderson Date: 11/02/2020 Gender: Male D.O.B: 12-21-1960 Age (years): 59 Referring Provider: Cassie Freer ONeal Height (inches): 61 Interpreting Physician: Fransico Him MD, ABSM Weight (lbs): 225 RPSGT: Baxter Flattery BMI: 31 MRN: [id number] Neck Size: 17.00  CLINICAL INFORMATION Sleep Study Type: NPSG  Indication for sleep study: Snoring, Witnesses Apnea / Gasping During Sleep  Epworth Sleepiness Score: 9  SLEEP STUDY TECHNIQUE  As per the AASM Manual for the Scoring of Sleep and Associated Events v2.3 (April 2016) with a hypopnea requiring 4% desaturations.  The channels recorded and monitored were frontal, central and occipital EEG, electrooculogram (EOG), submentalis EMG (chin), nasal and oral airflow, thoracic and abdominal wall motion, anterior tibialis EMG, snore microphone, electrocardiogram, and pulse oximetry.  MEDICATIONS Medications self-administered by patient taken the night of the study : N/A  SLEEP ARCHITECTURE The study was initiated at 11:07:11 PM and ended at 5:06:09 AM.  Sleep onset time was 95.3 minutes and the sleep efficiency was 48.7%. The total sleep time was 174.7 minutes.  Stage REM latency was 98.5 minutes.  The patient spent 9.2% of the night in stage N1 sleep, 72.2% in stage N2 sleep, 0.0% in stage N3 and 18.6% in REM.  Alpha intrusion was absent.  Supine sleep was 4.99%.  RESPIRATORY PARAMETERS The overall apnea/hypopnea index (AHI) was 2.7 per hour. There were 1 total apneas, including 0 obstructive, 1 central and 0 mixed apneas. There were 7 hypopneas and 4 RERAs.  The AHI during Stage REM sleep was 1.8 per hour.  AHI while supine was 6.9 per hour.  The mean oxygen saturation was 94.9%. The minimum SpO2 during sleep was 86.0%.  loud snoring was noted during this study.  CARDIAC DATA The 2 lead EKG demonstrated sinus rhythm. The mean heart rate was 81.8 beats per minute.   LEG MOVEMENT DATA The total  PLMS were 0 with a resulting PLMS index of 0.0. Associated arousal with leg movement index was 0.0 .  IMPRESSIONS - No significant obstructive sleep apnea occurred during this study (AHI = 2.7/h). - No significant central sleep apnea occurred during this study (CAI = 0.3/h). - Mild oxygen desaturation was noted during this study (Min O2 = 86.0%). - The patient snored with loud snoring volume. - Clinically significant periodic limb movements did not occur during sleep. No significant associated arousals.  DIAGNOSIS - Nondiagnostic study due to insufficient sleep time.   RECOMMENDATIONS - Avoid alcohol, sedatives and other CNS depressants that may worsen sleep apnea and disrupt normal sleep architecture. - Sleep hygiene should be reviewed to assess factors that may improve sleep quality. - Weight management and regular exercise should be initiated or continued if appropriate. - Recommend repeat study with sleep aide.  [Electronically signed] 11/13/2020 08:57 AM  Fransico Him MD, ABSM Diplomate, American Board of Sleep Medicine

## 2020-11-17 ENCOUNTER — Encounter: Payer: Self-pay | Admitting: Physical Medicine and Rehabilitation

## 2020-11-17 ENCOUNTER — Ambulatory Visit (INDEPENDENT_AMBULATORY_CARE_PROVIDER_SITE_OTHER): Payer: Medicaid Other | Admitting: Physical Medicine and Rehabilitation

## 2020-11-17 ENCOUNTER — Other Ambulatory Visit: Payer: Self-pay

## 2020-11-17 DIAGNOSIS — R202 Paresthesia of skin: Secondary | ICD-10-CM

## 2020-11-17 NOTE — Progress Notes (Signed)
Gene Anderson - 60 y.o. male MRN [id number]  Date of birth: [birth year]  Office Visit Note: Visit Date: 11/17/2020 PCP: Nolene Ebbs, MD Referred by: Nolene Ebbs, MD  Subjective: Chief Complaint  Patient presents with  . Left Arm - Pain  . Right Arm - Pain  . Left Hand - Numbness   HPI:  Gene Anderson is a 60 y.o. male who comes in today at the request of Dr. [name] for electrodiagnostic study of the Left upper extremities.  Patient is Right hand dominant.  He reports 7 out of 10 pain in both arms down to the hands but particularly the left fifth digit.  Dr. Louanne Skye was very concerned about the ulnar nerve versus C8 radiculopathy on the left.  He has had no new trauma or other changes since he saw Dr. Louanne Skye.   ROS Otherwise per HPI.  Assessment & Plan: Visit Diagnoses:    ICD-10-CM   1. 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 This is improved significantly from prior electrodiagnostic study.   There is no significant electrodiagnostic evidence of any other focal nerve entrapment, brachial plexopathy or cervical radiculopathy  Recommendations: 1.  Follow-up with referring physician. 2.  Continue current management of symptoms.  Meds & Orders: No orders of the defined types were placed in this encounter.   Orders Placed This Encounter  Procedures  . NCV with EMG (electromyography)    Follow-up: Return in about 2 weeks (around 12/01/2020) for [name], MD.   Procedures: No procedures performed  EMG & NCV Findings: Evaluation of the left ulnar motor nerve showed decreased conduction velocity (A Elbow-B Elbow, 40 m/s).  The left median (across palm) sensory nerve showed no response (Palm) and prolonged distal peak latency (3.8 ms).  The left  ulnar sensory nerve showed reduced amplitude (7.5 V).  All remaining nerves (as indicated in the following tables) were within normal limits.    All examined muscles (as indicated in the following table) showed no evidence of electrical instability.    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 This is improved significantly from prior electrodiagnostic study.   There is no significant electrodiagnostic evidence of any other focal nerve entrapment, brachial plexopathy or cervical radiculopathy  Recommendations: 1.  Follow-up with referring physician. 2.  Continue current management of symptoms.  ___________________________ Laurence Spates FAAPMR Board Certified, American Board of Physical Medicine and Rehabilitation    Nerve Conduction Studies Anti Sensory Summary Table   Stim Site NR Peak (ms) Norm Peak (ms) P-T Amp (V) Norm P-T Amp Site1 Site2 Delta-P (ms) Dist (cm) Vel (m/s) Norm Vel (m/s)  Left Median Acr Palm Anti Sensory (2nd Digit)  30.5C  Wrist    *3.8 <3.6 16.5 >10 Wrist Palm  0.0    Palm *NR  <2.0          Left Ulnar Anti Sensory (5th Digit)  31.4C  Wrist    3.5 <3.7 *7.5 >15.0 Wrist 5th Digit 3.5 14.0 40 >38   Motor Summary Table   Stim Site NR Onset (ms) Norm Onset (ms) O-P Amp (mV) Norm O-P Amp Site1 Site2 Delta-0 (ms) Dist (cm) Vel (m/s) Norm Vel (m/s)  Left Median Motor (Abd Poll Brev)  31.6C  Wrist    3.3 <4.2 11.7 >5 Elbow Wrist 5.1 27.0 53 >50  Elbow    8.4  11.3         Left Ulnar Motor (Abd Dig Min)  31.8C  Wrist    3.4 <4.2 5.1 >3 B Elbow Wrist 4.7 26.0 55 >53  B Elbow    8.1  4.2  A Elbow B Elbow 2.5 10.0 *40 >53  A Elbow    10.6  4.8          EMG   Side Muscle Nerve Root Ins Act Fibs Psw Amp Dur Poly Recrt Int Fraser Din Comment  Left Abd Poll Brev Median C8-T1 Nml Nml Nml Nml Nml 0 Nml Nml   Left  1stDorInt Ulnar C8-T1 Nml Nml Nml Nml Nml 0 Nml Nml   Left PronatorTeres Median C6-7 Nml Nml Nml Nml Nml 0 Nml Nml   Left Biceps Musculocut C5-6 Nml Nml Nml Nml Nml 0 Nml Nml   Left Deltoid Axillary C5-6 Nml Nml Nml Nml Nml 0 Nml Nml     Nerve Conduction Studies Anti Sensory Left/Right Comparison   Stim Site L Lat (ms) R Lat (ms) L-R Lat (ms) L Amp (V) R Amp (V) L-R Amp (%) Site1 Site2 L Vel (m/s) R Vel (m/s) L-R Vel (m/s)  Median Acr Palm Anti Sensory (2nd Digit)  30.5C  Wrist *3.8   16.5   Wrist Palm     Palm             Ulnar Anti Sensory (5th Digit)  31.4C  Wrist 3.5   *7.5   Wrist 5th Digit 40     Motor Left/Right Comparison   Stim Site L Lat (ms) R Lat (ms) L-R Lat (ms) L Amp (mV) R Amp (mV) L-R Amp (%) Site1 Site2 L Vel (m/s) R Vel (m/s) L-R Vel (m/s)  Median Motor (Abd Poll Brev)  31.6C  Wrist 3.3   11.7   Elbow Wrist 53    Elbow 8.4   11.3         Ulnar Motor (Abd Dig Min)  31.8C  Wrist 3.4   5.1   B Elbow Wrist 55    B Elbow 8.1   4.2   A Elbow B Elbow *40    A Elbow 10.6   4.8            Waveforms:            Clinical History: 02/11/2018  Impression: The above electrodiagnostic study is ABNORMAL and reveals evidence of:  1. 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 This does represent some improvement in terms of nerve conductions and amount of denervation seen. He does however have atrophy in the prior study did show severe nerve compression. He may indeed have some permanent sensation changes but would likely get some return of strength to do to sprouting of motor units.  2. 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 This is new from prior study.   There is no significant electrodiagnostic evidence of any other focal nerve entrapment, brachial plexopathy or cervical radiculopathy.   Recommendations: 1. Follow-up with  referring physician. 2. Continue current management of symptoms. 3. Repeat studies in 3-4 months if symptoms are progressive or not resolved.   ----- EMG/NCS 06/2017  Impression: The above electrodiagnostic study is ABNORMAL and reveals  evidence of a severe left ulnar nerve entrapment at the elbow cubital tunnel syndrome) affecting sensory and motor components. 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     Objective:  VS:  HT:    WT:   BMI:     BP:   HR: bpm  TEMP: ( )  RESP:  Physical Exam Musculoskeletal:        General: No tenderness.     Comments: Inspection reveals no atrophy of the bilateral APB or FDI or hand intrinsics. There is no swelling, color changes, allodynia or dystrophic changes. There is 4 out of 5 left finger abduction and 5 out of 5 strength in the bilateral wrist extension and long finger flexion. There is intact sensation to light touch in all dermatomal and peripheral nerve distributions. There is a negative Froment's test bilaterally.  There is a negative Hoffmann's test bilaterally.  Skin:    General: Skin is warm and dry.     Findings: No erythema or rash.  Neurological:     General: No focal deficit present.     Mental Status: He is alert and oriented to person, place, and time.     Sensory: No sensory deficit.     Motor: No weakness or abnormal muscle tone.     Coordination: Coordination normal.     Gait: Gait normal.  Psychiatric:        Mood and Affect: Mood normal.        Behavior: Behavior normal.        Thought Content: Thought content normal.      Imaging: No results found.

## 2020-11-17 NOTE — Progress Notes (Signed)
Pain in both arms down to hands. "problems with joints." Numbness in left fifth finger. Right hand dominant +Lotion Numeric Pain Rating Scale and Functional Assessment Average Pain 7   In the last MONTH (on 0-10 scale) has pain interfered with the following?  1. General activity like being  able to carry out your everyday physical activities such as walking, climbing stairs, carrying groceries, or moving a chair?  Rating(8)

## 2020-11-23 NOTE — Procedures (Signed)
EMG & NCV Findings: Evaluation of the left ulnar motor nerve showed decreased conduction velocity (A Elbow-B Elbow, 40 m/s).  The left median (across palm) sensory nerve showed no response (Palm) and prolonged distal peak latency (3.8 ms).  The left ulnar sensory nerve showed reduced amplitude (7.5 V).  All remaining nerves (as indicated in the following tables) were within normal limits.    All examined muscles (as indicated in the following table) showed no evidence of electrical instability.    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 This is improved significantly from prior electrodiagnostic study.   There is no significant electrodiagnostic evidence of any other focal nerve entrapment, brachial plexopathy or cervical radiculopathy  Recommendations: 1.  Follow-up with referring physician. 2.  Continue current management of symptoms.  ___________________________ Laurence Spates FAAPMR Board Certified, American Board of Physical Medicine and Rehabilitation    Nerve Conduction Studies Anti Sensory Summary Table   Stim Site NR Peak (ms) Norm Peak (ms) P-T Amp (V) Norm P-T Amp Site1 Site2 Delta-P (ms) Dist (cm) Vel (m/s) Norm Vel (m/s)  Left Median Acr Palm Anti Sensory (2nd Digit)  30.5C  Wrist    *3.8 <3.6 16.5 >10 Wrist Palm  0.0    Palm *NR  <2.0          Left Ulnar Anti Sensory (5th Digit)  31.4C  Wrist    3.5 <3.7 *7.5 >15.0 Wrist 5th Digit 3.5 14.0 40 >38   Motor Summary Table   Stim Site NR Onset (ms) Norm Onset (ms) O-P Amp (mV) Norm O-P Amp Site1 Site2 Delta-0 (ms) Dist (cm) Vel (m/s) Norm Vel (m/s)  Left Median Motor (Abd Poll Brev)  31.6C  Wrist    3.3 <4.2 11.7 >5 Elbow Wrist 5.1 27.0 53 >50  Elbow    8.4  11.3         Left Ulnar Motor (Abd Dig Min)  31.8C  Wrist    3.4 <4.2 5.1 >3 B Elbow Wrist 4.7 26.0 55 >53   B Elbow    8.1  4.2  A Elbow B Elbow 2.5 10.0 *40 >53  A Elbow    10.6  4.8          EMG   Side Muscle Nerve Root Ins Act Fibs Psw Amp Dur Poly Recrt Int Fraser Din Comment  Left Abd Poll Brev Median C8-T1 Nml Nml Nml Nml Nml 0 Nml Nml   Left 1stDorInt Ulnar C8-T1 Nml Nml Nml Nml Nml 0 Nml Nml   Left PronatorTeres Median C6-7 Nml Nml Nml Nml Nml 0 Nml Nml   Left Biceps Musculocut C5-6 Nml Nml Nml Nml Nml 0 Nml Nml   Left Deltoid Axillary C5-6 Nml Nml Nml Nml Nml 0 Nml Nml     Nerve Conduction Studies Anti Sensory Left/Right Comparison   Stim Site L Lat (ms) R Lat (ms) L-R Lat (ms) L Amp (V) R Amp (V) L-R Amp (%) Site1 Site2 L Vel (m/s) R Vel (m/s) L-R Vel (m/s)  Median Acr Palm Anti Sensory (2nd Digit)  30.5C  Wrist *3.8   16.5   Wrist Palm     Palm             Ulnar Anti Sensory (5th Digit)  31.4C  Wrist 3.5   *7.5   Wrist 5th Digit 40     Motor Left/Right Comparison  Stim Site L Lat (ms) R Lat (ms) L-R Lat (ms) L Amp (mV) R Amp (mV) L-R Amp (%) Site1 Site2 L Vel (m/s) R Vel (m/s) L-R Vel (m/s)  Median Motor (Abd Poll Brev)  31.6C  Wrist 3.3   11.7   Elbow Wrist 53    Elbow 8.4   11.3         Ulnar Motor (Abd Dig Min)  31.8C  Wrist 3.4   5.1   B Elbow Wrist 55    B Elbow 8.1   4.2   A Elbow B Elbow *40    A Elbow 10.6   4.8            Waveforms:

## 2020-11-30 ENCOUNTER — Telehealth: Payer: Self-pay | Admitting: *Deleted

## 2020-11-30 DIAGNOSIS — G4733 Obstructive sleep apnea (adult) (pediatric): Secondary | ICD-10-CM

## 2020-11-30 NOTE — Telephone Encounter (Signed)
Informed patient of sleep study results and patient understanding was verbalized. Patient understands his sleep study showed Non diagnostic study due to lack of sleep time. Needs repeat PSG with sleep aide - please get Rx for sleep aide from ordering MD   Message sent to Dr Marisue Ivan to write the Rx  Pt is aware and agreeable to his results PSG to be scheduled

## 2020-11-30 NOTE — Telephone Encounter (Signed)
-----   Message from Sueanne Margarita, MD sent at 11/13/2020  9:03 AM EST ----- Non diagnostic study due to lack of sleep time.  Needs repeat PSG with sleep aide - please get Rx for sleep aide from ordering MD

## 2020-12-13 MED ORDER — ESZOPICLONE 1 MG PO TABS: 2.0000 mg | ORAL_TABLET | Freq: Every evening | ORAL | Status: AC | PRN

## 2020-12-13 NOTE — Telephone Encounter (Addendum)
Patient is scheduled for lab study on 01/26/21. Patient understands his sleep study will be done at [facility] sleep lab. Patient understands he will receive a sleep packet in a week or so. Patient understands to call if he does not receive the sleep packet in a timely manner. Patient agrees with treatment and thanked me for call. Message sent to dr Marisue Ivan to write Rx for sleep aide. Sleep aide ordered

## 2020-12-13 NOTE — Addendum Note (Signed)
Addended by: Freada Bergeron on: 12/13/2020 04:52 PM   Modules accepted: Orders

## 2020-12-14 NOTE — Telephone Encounter (Signed)
Sleep aide is fine by me.   [place] T. Audie Box, MD, [place] [facility]  [address] [place], [place] [address] [phone]  9:50 AM

## 2021-01-18 ENCOUNTER — Ambulatory Visit: Payer: Medicaid Other | Admitting: Specialist

## 2021-01-26 ENCOUNTER — Ambulatory Visit (HOSPITAL_BASED_OUTPATIENT_CLINIC_OR_DEPARTMENT_OTHER): Payer: Medicaid Other | Attending: Cardiology | Admitting: Cardiology

## 2021-03-20 ENCOUNTER — Telehealth: Payer: Self-pay | Admitting: Cardiovascular Disease

## 2021-03-20 NOTE — Telephone Encounter (Signed)
I attempted to contact patient to discuss- unable to reach patient or leave message.  Will try again later.

## 2021-03-20 NOTE — Telephone Encounter (Signed)
Patient was offered an appointment in October after receiving appointment request via mychart. Received the following message from patient after appt was offered:  "I'm having a episode the last couple of days with irregular heartbeat is there someone else I can talk to"

## 2021-03-21 NOTE — Telephone Encounter (Signed)
Attempted to call patient, no answer and unable to leave message due to mail box full.

## 2021-03-22 NOTE — Telephone Encounter (Signed)
Attempted to contact patient to discuss. Patient did not answer. Unable to leave a message.  Voicemail box was full.

## 2021-03-22 NOTE — Telephone Encounter (Signed)
Will send mychart message to call back to discuss- and get an appointment.  Will remove call from triage after 3 attempts of calling.

## 2021-04-23 ENCOUNTER — Other Ambulatory Visit: Payer: Self-pay | Admitting: Internal Medicine

## 2021-04-24 LAB — CBC
HCT: 44.2 % (ref 38.5–50.0)
Hemoglobin: 15 g/dL (ref 13.2–17.1)
MCH: 31.6 pg (ref 27.0–33.0)
MCHC: 33.9 g/dL (ref 32.0–36.0)
MCV: 93.1 fL (ref 80.0–100.0)
MPV: 9.9 fL (ref 7.5–12.5)
Platelets: 229 Thousand/uL (ref 140–400)
RBC: 4.75 Million/uL (ref 4.20–5.80)
RDW: 12.9 % (ref 11.0–15.0)
WBC: 3 Thousand/uL — ABNORMAL LOW (ref 3.8–10.8)

## 2021-04-24 LAB — COMPLETE METABOLIC PANEL WITH GFR
AG Ratio: 1.4 (calc) (ref 1.0–2.5)
ALT: 23 U/L (ref 9–46)
AST: 24 U/L (ref 10–35)
Albumin: 4.5 g/dL (ref 3.6–5.1)
Alkaline phosphatase (APISO): 82 U/L (ref 35–144)
BUN: 12 mg/dL (ref 7–25)
CO2: 22 mmol/L (ref 20–32)
Calcium: 9.2 mg/dL (ref 8.6–10.3)
Chloride: 105 mmol/L (ref 98–110)
Creat: 0.82 mg/dL (ref 0.70–1.30)
Globulin: 3.3 g/dL (calc) (ref 1.9–3.7)
Glucose, Bld: 116 mg/dL — ABNORMAL HIGH (ref 65–99)
Potassium: 3.8 mmol/L (ref 3.5–5.3)
Sodium: 140 mmol/L (ref 135–146)
Total Bilirubin: 0.4 mg/dL (ref 0.2–1.2)
Total Protein: 7.8 g/dL (ref 6.1–8.1)
eGFR: 101 mL/min/{1.73_m2} (ref >=60)

## 2021-04-24 LAB — LIPID PANEL
Cholesterol: 250 mg/dL — ABNORMAL HIGH (ref <200)
HDL: 67 mg/dL (ref >=40)
LDL Cholesterol (Calc): 156 mg/dL (calc) — ABNORMAL HIGH
Non-HDL Cholesterol (Calc): 183 mg/dL (calc) — ABNORMAL HIGH (ref <130)
Total CHOL/HDL Ratio: 3.7 (calc) (ref <5.0)
Triglycerides: 142 mg/dL (ref <150)

## 2021-04-24 LAB — PSA: PSA: 0.57 ng/mL

## 2021-04-24 LAB — TIQ-MISC

## 2021-04-24 LAB — VITAMIN D 25 HYDROXY (VIT D DEFICIENCY, FRACTURES): Vit D, 25-Hydroxy: 14 ng/mL — ABNORMAL LOW (ref 30–100)

## 2021-04-24 LAB — TSH: TSH: 0.76 m[IU]/L (ref 0.40–4.50)

## 2021-05-17 ENCOUNTER — Ambulatory Visit: Payer: Medicaid Other | Admitting: Surgery

## 2021-05-17 ENCOUNTER — Other Ambulatory Visit: Payer: Self-pay

## 2021-05-17 ENCOUNTER — Encounter: Payer: Self-pay | Admitting: Surgery

## 2021-05-17 VITALS — BP 138/91 | HR 82 | Ht 74.0 in | Wt 218.2 lb

## 2021-05-17 DIAGNOSIS — M1712 Unilateral primary osteoarthritis, left knee: Secondary | ICD-10-CM

## 2021-05-17 DIAGNOSIS — M1711 Unilateral primary osteoarthritis, right knee: Secondary | ICD-10-CM | POA: Diagnosis not present

## 2021-05-17 DIAGNOSIS — M170 Bilateral primary osteoarthritis of knee: Secondary | ICD-10-CM | POA: Diagnosis not present

## 2021-05-29 NOTE — Progress Notes (Signed)
Office Visit Note   Patient: Gene Anderson           Date of Birth: 1960-10-15           MRN: [id number] Visit Date: 05/17/2021              Requested by: Nolene Ebbs, MD [facility],  [place] [address] PCP: Nolene Ebbs, MD   Assessment & Plan: Visit Diagnoses:  1. Unilateral primary osteoarthritis, left knee   2. Unilateral primary osteoarthritis, right knee     Plan: The patient's ongoing bilateral knee pain secondary to end-stage DJD bilateral knees we will have him do a consult with Dr. Alphonzo Severance to discuss possible scheduling of total knee replacements.  Advised patient that anything short of this may not be of any benefit to him but Dr. Marlou Sa can discuss this with him further.  Follow-Up Instructions: Return in about 1 week (around 05/24/2021) for consult with dr dean to discuss possible scheduling bilateral total knee replacements.   Orders:  No orders of the defined types were placed in this encounter.  No orders of the defined types were placed in this encounter.     Procedures: No procedures performed   Clinical Data: No additional findings.   Subjective: Chief Complaint  Patient presents with   Right Knee - Follow-up   Left Knee - Follow-up    HPI 60 year old black male with history of end-stage DJD bilateral knees and right greater than left pain comes in for recheck.  He has had conservative management with Dr. Louanne Skye.  Bilateral knee intra-articular Marcaine/Depo-Medrol injection performed January 2022.  Injections do not give any long-term relief.  At that visit in January Dr. Louanne Skye had discussed patient seeing Dr. Ninfa Linden for some reason this did not happen.  Right greater than left knee pain with all activity.  Is having a fairly significant negative impact on his quality of life.  Describes having aching, sharp and throbbing pains to become worse since his last visit. Review of Systems No current cardiac pulmonary GI GU  issues  Objective: Vital Signs: BP (!) 138/91 (BP Location: Left Arm, Patient Position: Sitting, Cuff Size: Normal)   Pulse 82   Ht '6\' 2"'$  (1.88 m)   Wt 218 lb 3.2 oz (99 kg)   SpO2 95%   BMI 28.02 kg/m   Physical Exam HENT:     Head: Normocephalic and atraumatic.  Eyes:     Extraocular Movements: Extraocular movements intact.  Pulmonary:     Effort: No respiratory distress.  Musculoskeletal:     Comments: Gait is somewhat antalgic.  Bilateral knees positive crepitus.  Some swelling without significant effusions.  Bilateral knee joint line tender.  Bilateral calves nontender.  Neurovascular intact.  Neurological:     Mental Status: He is alert and oriented to person, place, and time.  Psychiatric:        Mood and Affect: Mood normal.    Ortho Exam  Specialty Comments:  No specialty comments available.  Imaging: No results found.   PMFS History: Patient Active Problem List   Diagnosis Date Noted   Cubital tunnel syndrome on left 08/26/2017    Class: Chronic   Atrial fibrillation ([place]) 04/18/2014   Paresthesia 02/16/2014   Neck pain 02/16/2014   Leukopenia 02/16/2013   Past Medical History:  Diagnosis Date   Arrhythmia    Atrial fibrillation ([place])    Cataract    OD   Diverticulosis    Fatty liver  06/20/13   Hyperplastic colon polyp    Hypertension    Internal hemorrhoids    Leukopenia    Neck pain    Prostate enlargement    Spinal stenosis     Family History  Problem Relation Age of Onset   Hypertension Mother    Cancer Father    Stroke Maternal Grandfather    Cancer Paternal Grandmother     Past Surgical History:  Procedure Laterality Date   CARDIOVERSION  03/2014   CATARACT EXTRACTION Left 05/14/2019   Dr. Wyatt Portela   EYE SURGERY     Flexible cystoscopy and repair of torn tunica albuginea     ULNAR NERVE TRANSPOSITION Left 08/26/2017   Procedure: LEFT ELBOW ULNAR NEUROLYSIS;  Surgeon: Jessy Oto, MD;  Location: [facility];   Service: Orthopedics;  Laterality: Left;   Social History   Occupational History    Employer: [employer]  Tobacco Use   Smoking status: Every Day    Packs/day: 0.75    Years: 40.00    Pack years: 30.00    Types: Cigarettes   Smokeless tobacco: Never  Vaping Use   Vaping Use: Never used  Substance and Sexual Activity   Alcohol use: Yes    Comment: social   Drug use: No   Sexual activity: Not on file

## 2021-05-30 ENCOUNTER — Other Ambulatory Visit: Payer: Self-pay

## 2021-05-30 ENCOUNTER — Ambulatory Visit (INDEPENDENT_AMBULATORY_CARE_PROVIDER_SITE_OTHER): Payer: Medicaid Other | Admitting: Orthopedic Surgery

## 2021-05-30 ENCOUNTER — Ambulatory Visit: Payer: Self-pay

## 2021-05-30 ENCOUNTER — Ambulatory Visit (INDEPENDENT_AMBULATORY_CARE_PROVIDER_SITE_OTHER): Payer: Medicaid Other

## 2021-05-30 ENCOUNTER — Encounter: Payer: Self-pay | Admitting: Orthopedic Surgery

## 2021-05-30 DIAGNOSIS — M1711 Unilateral primary osteoarthritis, right knee: Secondary | ICD-10-CM

## 2021-05-30 DIAGNOSIS — M1712 Unilateral primary osteoarthritis, left knee: Secondary | ICD-10-CM | POA: Diagnosis not present

## 2021-05-30 DIAGNOSIS — M170 Bilateral primary osteoarthritis of knee: Secondary | ICD-10-CM | POA: Diagnosis not present

## 2021-05-30 MED ORDER — LIDOCAINE HCL 1 % IJ SOLN
5.0000 mL | INTRAMUSCULAR | Status: AC | PRN
  Administered 2021-05-30: 5 mL

## 2021-05-30 MED ORDER — BUPIVACAINE HCL 0.25 % IJ SOLN
4.0000 mL | INTRAMUSCULAR | Status: AC | PRN
  Administered 2021-05-30: 4 mL via INTRA_ARTICULAR

## 2021-05-30 MED ORDER — METHYLPREDNISOLONE ACETATE 40 MG/ML IJ SUSP
40.0000 mg | INTRAMUSCULAR | Status: AC | PRN
  Administered 2021-05-30: 40 mg via INTRA_ARTICULAR

## 2021-05-30 NOTE — Progress Notes (Signed)
Office Visit Note   Patient: Gene Anderson           Date of Birth: [birth year]           MRN: [id number] Visit Date: 05/30/2021 Requested by: Nolene Ebbs, MD [facility] [place],  [place] [address] PCP: Nolene Ebbs, MD  Subjective: Chief Complaint  Patient presents with   Right Knee - Follow-up   Left Knee - Follow-up    HPI: Gene Anderson is a 60 y.o. male who presents to the office complaining of bilateral, right greater than left knee pain.  Report pain is been ongoing for years.  Localizes pain to the medial aspect of the knee.  Denies mechanical symptoms or feelings of instability.  Denies any groin pain or low back pain with radicular symptoms.  Patient takes over-the-counter Tylenol as well as oxycodone 15 mg 3-4 times per day, Lyrica with little relief.  Patient reports history of left knee meniscectomy surgery.  They note history of last injection several months ago with some relief.  Pain does wake them up at night but does not bother them at rest.  They deny any history of diabetes.  Additionally, patient does have history of atrial fibrillation previously but states that it is now resolved.  Does not take any blood thinners.  Denies any vascular history or history of chronic kidney disease.  Does have history of smoking and currently smokes 0.75 packs/day.  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  Received injections in his back as well for chronic back pain.  Injections in his knees usually last 2 to 3 months.              ROS:  All systems reviewed are negative as they relate to the chief complaint within the history of present illness.  Patient denies fevers or chills.  Assessment & Plan: Visit Diagnoses:  1. Unilateral primary osteoarthritis, right knee   2. Unilateral primary osteoarthritis, left knee     Plan: Gene Anderson is a 60 y.o. male who presents to the office complaining of bilateral, right greater than left knee pain.  He has been  referred by Benjiman Core, PA-C for consideration of knee replacement.  Reviewed his radiographs with him as well as discussing what knee replacement entails.  Discussed the risks and benefits of the procedure including the risk of nerve/vessel damage, knee stiffness, knee instability, need for revision surgery, medical complication from surgery such as DVT/PE, prosthetic joint infection.  Also discussed what recovery from surgery would entail and the pain level involved after knee replacement.  He would be at a higher risk for uncontrolled pain as well as a higher risk of wound complication/infection given his smoking history and history of chronic opioid use.  After lengthy discussion, patient does not wish to proceed with surgery at this time and he would like to proceed with just receiving cortisone injections.  Bilateral knee cortisone injections were administered and he tolerated the procedure well.  Left knee was aspirated of 30 cc in the right knee was aspirated of about 5 cc.  He will follow-up with either Dr. Marlou Sa or Benjiman Core for knee injections in the future and recommended he call the office if he would like to proceed with surgery in the future.  Patient agreed with plan.  Follow-Up Instructions: No follow-ups on file.   Orders:  Orders Placed This Encounter  Procedures   XR KNEE 3 VIEW RIGHT   XR  KNEE 3 VIEW LEFT   No orders of the defined types were placed in this encounter.     Procedures: Large Joint Inj: bilateral knee on 05/30/2021 7:57 PM Indications: diagnostic evaluation, joint swelling and pain Details: 18 G 1.5 in needle, superolateral approach  Arthrogram: No  Medications (Right): 5 mL lidocaine 1 %; 4 mL bupivacaine 0.25 %; 40 mg methylPREDNISolone acetate 40 MG/ML Aspirate (Right): 5 mL Medications (Left): 5 mL lidocaine 1 %; 4 mL bupivacaine 0.25 %; 40 mg methylPREDNISolone acetate 40 MG/ML Aspirate (Left): 30 mL Outcome: tolerated well, no immediate  complications Procedure, treatment alternatives, risks and benefits explained, specific risks discussed. Consent was given by the patient. Immediately prior to procedure a time out was called to verify the correct patient, procedure, equipment, support staff and site/side marked as required. Patient was prepped and draped in the usual sterile fashion.      Clinical Data: No additional findings.  Objective: Vital Signs: There were no vitals taken for this visit.  Physical Exam:  Constitutional: Patient appears well-developed HEENT:  Head: Normocephalic Eyes:EOM are normal Neck: Normal range of motion Cardiovascular: Normal rate Pulmonary/chest: Effort normal Neurologic: Patient is alert Skin: Skin is warm Psychiatric: Patient has normal mood and affect  Ortho Exam:  Bilateral knee Exam Effusion present in both knees, larger in the left. Extensor mechanism intact No TTP over the quad tendon, patellar tendon, pes anserinus, patella, tibial tubercle, LCL/MCL insertions TTP over the medial lateral joint lines of both knees Stable to varus/valgus stresses.  Stable to anterior/posterior drawer Left knee with 0 to 120 degrees.  Right knee with 5 to 115 degrees. No pain with hip range of motion.  Negative straight leg raise. 2+ DP pulse of bilateral legs  Specialty Comments:  No specialty comments available.  Imaging: No results found.   PMFS History: Patient Active Problem List   Diagnosis Date Noted   Cubital tunnel syndrome on left 08/26/2017    Class: Chronic   Atrial fibrillation ([place]) 04/18/2014   Paresthesia 02/16/2014   Neck pain 02/16/2014   Leukopenia 02/16/2013   Past Medical History:  Diagnosis Date   Arrhythmia    Atrial fibrillation ([place])    Cataract    OD   Diverticulosis    Fatty liver 06/20/13   Hyperplastic colon polyp    Hypertension    Internal hemorrhoids    Leukopenia    Neck pain    Prostate enlargement    Spinal stenosis     Family  History  Problem Relation Age of Onset   Hypertension Mother    Cancer Father    Stroke Maternal Grandfather    Cancer Paternal Grandmother     Past Surgical History:  Procedure Laterality Date   CARDIOVERSION  03/2014   CATARACT EXTRACTION Left 05/14/2019   Dr. Wyatt Portela   EYE SURGERY     Flexible cystoscopy and repair of torn tunica albuginea     ULNAR NERVE TRANSPOSITION Left 08/26/2017   Procedure: LEFT ELBOW ULNAR NEUROLYSIS;  Surgeon: Jessy Oto, MD;  Location: [facility];  Service: Orthopedics;  Laterality: Left;   Social History   Occupational History    Employer: [employer]  Tobacco Use   Smoking status: Every Day    Packs/day: 0.75    Years: 40.00    Pack years: 30.00    Types: Cigarettes   Smokeless tobacco: Never  Vaping Use   Vaping Use: Never used  Substance and Sexual Activity  Alcohol use: Yes    Comment: social   Drug use: No   Sexual activity: Not on file

## 2021-06-27 ENCOUNTER — Telehealth: Payer: Self-pay | Admitting: Cardiovascular Disease

## 2021-06-27 NOTE — Telephone Encounter (Signed)
Pt c/o of Chest Pain: STAT if CP now or developed within 24 hours  1. Are you having CP right now?  Yes   2. Are you experiencing any other symptoms (ex. SOB, nausea, vomiting, sweating)?  No   3. How long have you been experiencing CP?  About 1 month, per patient   4. Is your CP continuous or coming and going?  Coming and going   5. Have you taken Nitroglycerin?  Nitroglycerin  ?

## 2021-06-27 NOTE — Telephone Encounter (Signed)
Received a call from patient he stated he woke up this morning with pain in mid back radiating down left shoulder and arm.He continues to have pain. Rates pain # 7 to # 8.Advised to go to ED to be evaluated.

## 2021-06-28 ENCOUNTER — Encounter (HOSPITAL_COMMUNITY): Payer: Self-pay | Admitting: Emergency Medicine

## 2021-06-28 ENCOUNTER — Emergency Department (HOSPITAL_COMMUNITY)
Admission: EM | Admit: 2021-06-28 | Discharge: 2021-06-28 | Disposition: A | Discharge status: Home / Self Care | Payer: Medicaid Other | Attending: Emergency Medicine | Admitting: Emergency Medicine

## 2021-06-28 ENCOUNTER — Emergency Department (HOSPITAL_COMMUNITY): Payer: Medicaid Other

## 2021-06-28 DIAGNOSIS — M25512 Pain in left shoulder: Secondary | ICD-10-CM | POA: Insufficient documentation

## 2021-06-28 DIAGNOSIS — Z5321 Procedure and treatment not carried out due to patient leaving prior to being seen by health care provider: Secondary | ICD-10-CM | POA: Insufficient documentation

## 2021-06-28 DIAGNOSIS — R079 Chest pain, unspecified: Secondary | ICD-10-CM | POA: Diagnosis present

## 2021-06-28 LAB — BASIC METABOLIC PANEL
Anion gap: 10 (ref 5–15)
BUN: 8 mg/dL (ref 6–20)
CO2: 24 mmol/L (ref 22–32)
Calcium: 8.9 mg/dL (ref 8.9–10.3)
Chloride: 106 mmol/L (ref 98–111)
Creatinine, Ser: 0.87 mg/dL (ref 0.61–1.24)
GFR, Estimated: >60 mL/min (ref >60)
Glucose, Bld: 101 mg/dL — ABNORMAL HIGH (ref 70–99)
Potassium: 3.9 mmol/L (ref 3.5–5.1)
Sodium: 140 mmol/L (ref 135–145)

## 2021-06-28 LAB — CBC
HCT: 40.9 % (ref 39.0–52.0)
Hemoglobin: 13.8 g/dL (ref 13.0–17.0)
MCH: 32.2 pg (ref 26.0–34.0)
MCHC: 33.7 g/dL (ref 30.0–36.0)
MCV: 95.3 fL (ref 80.0–100.0)
Platelets: 246 10*3/uL (ref 150–400)
RBC: 4.29 MIL/uL (ref 4.22–5.81)
RDW: 12.5 % (ref 11.5–15.5)
WBC: 3.3 10*3/uL — ABNORMAL LOW (ref 4.0–10.5)
nRBC: 0 % (ref 0.0–0.2)

## 2021-06-28 LAB — TROPONIN I (HIGH SENSITIVITY): Troponin I (High Sensitivity): 13 ng/L (ref <18)

## 2021-06-28 MED ORDER — ASPIRIN 81 MG PO CHEW: 324.0000 mg | CHEWABLE_TABLET | Freq: Once | ORAL | Status: DC

## 2021-06-28 NOTE — ED Notes (Signed)
Pt gave this NT stickers and stated he was leaving.

## 2021-06-28 NOTE — ED Triage Notes (Signed)
Pt endorses left sided and shoulder blade/ CP since Monday. Hx of Afib but not on blood thinners.

## 2021-06-28 NOTE — ED Provider Notes (Signed)
Emergency Medicine Provider Triage Evaluation Note  Gene Anderson , a 60 y.o. male  was evaluated in triage.  Pt complains of left side chest pain off and on since Monday, radiates to left shoulder.  Sharp and burning in nature.  Worse with movement, improves with rest.  Not associate with diaphoresis, nausea, shortness of breath.. History of "atrial something," not on thinners. Review of Systems  Positive: Chest pain Negative: , Shortness of breath abdominal pain  Physical Exam  There were no vitals taken for this visit. Gen:   Awake, no distress   Resp:  Normal effort  MSK:   Moves extremities without difficulty  Other:    Medical Decision Making  Medically screening exam initiated at 10:20 AM.  Appropriate orders placed.  Gene Anderson was informed that the remainder of the evaluation will be completed by another provider, this initial triage assessment does not replace that evaluation, and the importance of remaining in the ED until their evaluation is complete.     Tacy Learn, PA-C 06/28/21 1021    Lucrezia Starch, MD 06/28/21 1328

## 2021-08-21 ENCOUNTER — Telehealth: Payer: Self-pay

## 2021-08-21 NOTE — Telephone Encounter (Signed)
Letter has been sent to patient informing them that their sleep study has expired. Patient will need to call and schedule an office visit to re-evaluate the need for a sleep study.    

## 2021-08-30 ENCOUNTER — Ambulatory Visit: Payer: Medicaid Other | Admitting: Surgery

## 2022-02-11 ENCOUNTER — Telehealth: Payer: Self-pay | Admitting: Physical Medicine and Rehabilitation

## 2022-02-11 NOTE — Telephone Encounter (Signed)
Pt called requesting for an back injection. Please call pt at [phone]. ?

## 2022-02-11 NOTE — Telephone Encounter (Signed)
Patient called. Returning a call to schedule with Dr. Newton.  ?

## 2022-03-19 ENCOUNTER — Encounter: Payer: Self-pay | Admitting: Physical Medicine and Rehabilitation

## 2022-03-19 ENCOUNTER — Ambulatory Visit (INDEPENDENT_AMBULATORY_CARE_PROVIDER_SITE_OTHER): Payer: Medicaid Other | Admitting: Physical Medicine and Rehabilitation

## 2022-03-19 ENCOUNTER — Ambulatory Visit: Payer: Self-pay

## 2022-03-19 VITALS — BP 142/88 | HR 105

## 2022-03-19 DIAGNOSIS — M5416 Radiculopathy, lumbar region: Secondary | ICD-10-CM

## 2022-03-19 MED ORDER — METHYLPREDNISOLONE ACETATE 80 MG/ML IJ SUSP
80.0000 mg | Freq: Once | INTRAMUSCULAR | Status: AC
  Administered 2022-03-19: 80 mg

## 2022-03-19 NOTE — Progress Notes (Signed)
Pt state lower back pain that travels down his left leg. Pt state walking, standing and laying down makes the pin arose. Pt state he takes pain meds to help ease his pain.  Numeric Pain Rating Scale and Functional Assessment Average Pain 7   In the last MONTH (on 0-10 scale) has pain interfered with the following?  1. General activity like being  able to carry out your everyday physical activities such as walking, climbing stairs, carrying groceries, or moving a chair?  Rating(9)   +Driver, -BT, -Dye Allergies.

## 2022-03-19 NOTE — Patient Instructions (Signed)

## 2022-03-29 NOTE — Procedures (Signed)
Lumbosacral Transforaminal Epidural Steroid Injection - Sub-Pedicular Approach with Fluoroscopic Guidance  Patient: Gene Anderson      Date of Birth: 03/19/1961 MRN: [id number] PCP: Nolene Ebbs, MD      Visit Date: 03/19/2022   Universal Protocol:    Date/Time: 03/19/2022  Consent Given By: the patient  Position: PRONE  Additional Comments: Vital signs were monitored before and after the procedure. Patient was prepped and draped in the usual sterile fashion. The correct patient, procedure, and site was verified.   Injection Procedure Details:   Procedure diagnoses: Lumbar radiculopathy [M54.16]    Meds Administered:  Meds ordered this encounter  Medications   methylPREDNISolone acetate (DEPO-MEDROL) injection 80 mg    Laterality: Left  Location/Site: L4  Needle:5.0 in., 22 ga.  Short bevel or Quincke spinal needle  Needle Placement: Transforaminal  Findings:    -Comments: Excellent flow of contrast along the nerve, nerve root and into the epidural space.  Procedure Details: After squaring off the end-plates to get a true AP view, the C-arm was positioned so that an oblique view of the foramen as noted above was visualized. The target area is just inferior to the "nose of the scotty dog" or sub pedicular. The soft tissues overlying this structure were infiltrated with 2-3 ml. of 1% Lidocaine without Epinephrine.  The spinal needle was inserted toward the target using a "trajectory" view along the fluoroscope beam.  Under AP and lateral visualization, the needle was advanced so it did not puncture dura and was located close the 6 O'Clock position of the pedical in AP tracterory. Biplanar projections were used to confirm position. Aspiration was confirmed to be negative for CSF and/or blood. A 1-2 ml. volume of Isovue-250 was injected and flow of contrast was noted at each level. Radiographs were obtained for documentation purposes.   After attaining the desired flow  of contrast documented above, a 0.5 to 1.0 ml test dose of 0.25% Marcaine was injected into each respective transforaminal space.  The patient was observed for 90 seconds post injection.  After no sensory deficits were reported, and normal lower extremity motor function was noted,   the above injectate was administered so that equal amounts of the injectate were placed at each foramen (level) into the transforaminal epidural space.   Additional Comments:  The patient tolerated the procedure well Dressing: 2 x 2 sterile gauze and Band-Aid    Post-procedure details: Patient was observed during the procedure. Post-procedure instructions were reviewed.  Patient left the clinic in stable condition.

## 2022-03-29 NOTE — Progress Notes (Signed)
Gene Anderson - 61 y.o. male MRN [id number]  Date of birth: 17-May-1961  Office Visit Note: Visit Date: 03/19/2022 PCP: Nolene Ebbs, MD Referred by: Nolene Ebbs, MD  Subjective: Chief Complaint  Patient presents with   Lower Back - Pain   Left Leg - Pain   HPI:  Gene Anderson is a 61 y.o. male who comes in today for planned repeat Left L4-5  Lumbar Transforaminal epidural steroid injection with fluoroscopic guidance.  The patient has failed conservative care including home exercise, medications, time and activity modification.  This injection will be diagnostic and hopefully therapeutic.  Please see requesting physician notes for further details and justification. Patient received more than 50% pain relief from prior injection.   Referring: Barnet Pall, FNP   ROS Otherwise per HPI.  Assessment & Plan: Visit Diagnoses:    ICD-10-CM   1. Lumbar radiculopathy  M54.16 XR C-ARM NO REPORT    Epidural Steroid injection    methylPREDNISolone acetate (DEPO-MEDROL) injection 80 mg      Plan: No additional findings.   Meds & Orders:  Meds ordered this encounter  Medications   methylPREDNISolone acetate (DEPO-MEDROL) injection 80 mg    Orders Placed This Encounter  Procedures   XR C-ARM NO REPORT   Epidural Steroid injection    Follow-up: Return if symptoms worsen or fail to improve.   Procedures: No procedures performed  Lumbosacral Transforaminal Epidural Steroid Injection - Sub-Pedicular Approach with Fluoroscopic Guidance  Patient: Gene Anderson      Date of Birth: 01-06-1961 MRN: [id number] PCP: Nolene Ebbs, MD      Visit Date: 03/19/2022   Universal Protocol:    Date/Time: 03/19/2022  Consent Given By: the patient  Position: PRONE  Additional Comments: Vital signs were monitored before and after the procedure. Patient was prepped and draped in the usual sterile fashion. The correct patient, procedure, and site was verified.   Injection  Procedure Details:   Procedure diagnoses: Lumbar radiculopathy [M54.16]    Meds Administered:  Meds ordered this encounter  Medications   methylPREDNISolone acetate (DEPO-MEDROL) injection 80 mg    Laterality: Left  Location/Site: L4  Needle:5.0 in., 22 ga.  Short bevel or Quincke spinal needle  Needle Placement: Transforaminal  Findings:    -Comments: Excellent flow of contrast along the nerve, nerve root and into the epidural space.  Procedure Details: After squaring off the end-plates to get a true AP view, the C-arm was positioned so that an oblique view of the foramen as noted above was visualized. The target area is just inferior to the "nose of the scotty dog" or sub pedicular. The soft tissues overlying this structure were infiltrated with 2-3 ml. of 1% Lidocaine without Epinephrine.  The spinal needle was inserted toward the target using a "trajectory" view along the fluoroscope beam.  Under AP and lateral visualization, the needle was advanced so it did not puncture dura and was located close the 6 O'Clock position of the pedical in AP tracterory. Biplanar projections were used to confirm position. Aspiration was confirmed to be negative for CSF and/or blood. A 1-2 ml. volume of Isovue-250 was injected and flow of contrast was noted at each level. Radiographs were obtained for documentation purposes.   After attaining the desired flow of contrast documented above, a 0.5 to 1.0 ml test dose of 0.25% Marcaine was injected into each respective transforaminal space.  The patient was observed for 90 seconds post injection.  After no sensory deficits were  reported, and normal lower extremity motor function was noted,   the above injectate was administered so that equal amounts of the injectate were placed at each foramen (level) into the transforaminal epidural space.   Additional Comments:  The patient tolerated the procedure well Dressing: 2 x 2 sterile gauze and Band-Aid     Post-procedure details: Patient was observed during the procedure. Post-procedure instructions were reviewed.  Patient left the clinic in stable condition.    Clinical History: MRI LUMBAR SPINE WITHOUT CONTRAST   TECHNIQUE: Multiplanar, multisequence MR imaging of the lumbar spine was performed. No intravenous contrast was administered.   COMPARISON:  12/09/2017.  MRI of the lumbar spine 02/26/2013   FINDINGS: Segmentation: 5 non rib-bearing lumbar type vertebral bodies are present.   Alignment: Grade 1 anterolisthesis is present at L4-5. AP alignment is otherwise anatomic.   Vertebrae: Concave end plates are similar the prior studies. Chronic endplate marrow changes are worse left than right at T12-L1. Fatty infiltration into the sacrum and iliac bones is noted.   Conus medullaris and cauda equina: Conus extends to the L1 level. Conus and cauda equina appear normal.   Paraspinal and other soft tissues: Limited imaging the abdomen is unremarkable. There is no significant adenopathy.   Disc levels:   The disc levels at L1-2 and L2-3 are unremarkable.   L3-4: A broad-based disc protrusion is present. Moderate facet hypertrophy is slightly worse. There is progressive epidural fat with crowding of the nerve roots in the thecal sac. Mild foraminal narrowing has progressed bilaterally.   L4-5: A broad-based disc protrusion is evident. Advanced facet hypertrophy is again noted. Prominent epidural fat is present. The combination compresses the nerve roots. Mild foraminal narrowing demonstrate some progression bilaterally.   L5-S1: A broad-based disc protrusion is present. Prominent epidural fat is noted. Mild facet hypertrophy has progressed.   IMPRESSION: 1. 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 2. Progressive mild foraminal narrowing bilaterally  at L3-4 and L4-5. 3. The foramina at L5-S1 are patent. 4. Grade 1 anterolisthesis at L4-5.     Electronically Signed   By: San Morelle M.D.   On: 12/21/2017 15:10     Objective:  VS:  HT:    WT:   BMI:     BP:(!) 142/88  HR:(!) 105bpm  TEMP: ( )  RESP:  Physical Exam   Imaging: No results found.

## 2022-04-22 ENCOUNTER — Encounter: Payer: Self-pay | Admitting: Specialist

## 2022-04-22 ENCOUNTER — Ambulatory Visit (INDEPENDENT_AMBULATORY_CARE_PROVIDER_SITE_OTHER): Payer: Medicaid Other | Admitting: Specialist

## 2022-04-22 VITALS — BP 147/107 | HR 78 | Ht 74.0 in | Wt 219.0 lb

## 2022-04-22 DIAGNOSIS — R202 Paresthesia of skin: Secondary | ICD-10-CM | POA: Diagnosis not present

## 2022-04-22 DIAGNOSIS — M48062 Spinal stenosis, lumbar region with neurogenic claudication: Secondary | ICD-10-CM

## 2022-04-22 DIAGNOSIS — M1712 Unilateral primary osteoarthritis, left knee: Secondary | ICD-10-CM

## 2022-04-22 DIAGNOSIS — M4316 Spondylolisthesis, lumbar region: Secondary | ICD-10-CM

## 2022-04-22 DIAGNOSIS — M5416 Radiculopathy, lumbar region: Secondary | ICD-10-CM

## 2022-04-22 MED ORDER — BUPIVACAINE HCL 0.5 % IJ SOLN
5.0000 mL | INTRAMUSCULAR | Status: AC | PRN
Start: 2022-04-22 — End: 2022-04-22
  Administered 2022-04-22: 5 mL via INTRA_ARTICULAR

## 2022-04-22 MED ORDER — METHYLPREDNISOLONE ACETATE 40 MG/ML IJ SUSP
40.0000 mg | INTRAMUSCULAR | Status: AC | PRN
Start: 2022-04-22 — End: 2022-04-22
  Administered 2022-04-22: 40 mg via INTRA_ARTICULAR

## 2022-04-22 NOTE — Patient Instructions (Signed)
Plan: Avoid bending, stooping and avoid lifting weights greater than 10 lbs. Avoid prolong standing and walking. Avoid frequent bending and stooping  No lifting greater than 10 lbs. May use ice or moist heat for pain. Weight loss is of benefit. Handicap license is approved. Call if the pain worsens and we would schedule for Dr. Romona Curls secretary/Assistant will call to arrange for epidural steroid injection    Ice the knee that is suffering from osteoarthritis, only real proven treatments are Weight loss, NSIADs like diclofenac and exercise. Well padded shoes help. Ice the knee 2-3 times a day 15-20 mins at a time.-3 times a day 15-20 mins at a time. Hot showers in the AM.  Injection with steroid may be of benefit. Hemp CBD capsules, amazon.[employer] 5,000-7,000 mg per bottle, 60 capsules per bottle, take one capsule twice a day. Cane in the left hand to use with left leg weight bearing. Follow-Up Instructions: No follow-ups on file.   Follow-Up Instructions: No follow-ups on file.

## 2022-04-22 NOTE — Progress Notes (Addendum)
Office Visit Note   Patient: Gene Anderson           Date of Birth: 12/02/1960           MRN: [id number] Visit Date: 04/22/2022              Requested by: Nolene Ebbs, MD [address] [place],  [place] [address] PCP: Nolene Ebbs, MD   Assessment & Plan: Visit Diagnoses:  1. Spondylolisthesis at L4-L5 level   2. Spinal stenosis of lumbar region with neurogenic claudication   3. Lumbar stenosis with neurogenic claudication   4. Lumbar radiculopathy   5. Paresthesia of skin     Plan: Avoid bending, stooping and avoid lifting weights greater than 10 lbs. Avoid prolong standing and walking. Avoid frequent bending and stooping  No lifting greater than 10 lbs. May use ice or moist heat for pain. Weight loss is of benefit. Handicap license is approved. Call if the pain worsens and we would schedule for Dr. Romona Curls secretary/Assistant will call to arrange for epidural steroid injection    Ice the knee that is suffering from osteoarthritis, only real proven treatments are Weight loss, NSIADs like diclofenac and exercise. Well padded shoes help. Ice the knee 2-3 times a day 15-20 mins at a time.-3 times a day 15-20 mins at a time. Hot showers in the AM.  Injection with steroid may be of benefit. Hemp CBD capsules, amazon.com 5,000-7,000 mg per bottle, 60 capsules per bottle, take one capsule twice a day. Cane in the left hand to use with left leg weight bearing. Follow-Up Instructions: No follow-ups on file.   Follow-Up Instructions: No follow-ups on file.   Orders:  No orders of the defined types were placed in this encounter.  No orders of the defined types were placed in this encounter.     Procedures: Large Joint Inj: L knee on 04/22/2022 4:04 PM Indications: pain Details: 18 G 1.5 in needle, superolateral approach  Arthrogram: No  Medications: 40 mg methylPREDNISolone acetate 40 MG/ML; 5 mL bupivacaine 0.5 % Aspirate: 16 mL clear Outcome: tolerated well,  no immediate complications Procedure, treatment alternatives, risks and benefits explained, specific risks discussed. Consent was given by the patient. Immediately prior to procedure a time out was called to verify the correct patient, procedure, equipment, support staff and site/side marked as required. Patient was prepped and draped in the usual sterile fashion.     Clinical Data: No additional findings.   Subjective: Chief Complaint  Patient presents with   Lower Back - Follow-up    Had Left L4 TF with Dr. Ernestina Patches on 03/19/22, states that he got 50% relief from it.    HPI  Review of Systems   Objective: Vital Signs: BP (!) 147/107 (BP Location: Left Arm, Patient Position: Sitting)   Pulse 78   Ht '6\' 2"'$  (1.88 m)   Wt 219 lb (99.3 kg)   BMI 28.12 kg/m   Physical Exam  Ortho Exam  Specialty Comments:  MRI LUMBAR SPINE WITHOUT CONTRAST   TECHNIQUE: Multiplanar, multisequence MR imaging of the lumbar spine was performed. No intravenous contrast was administered.   COMPARISON:  12/09/2017.  MRI of the lumbar spine 02/26/2013   FINDINGS: Segmentation: 5 non rib-bearing lumbar type vertebral bodies are present.   Alignment: Grade 1 anterolisthesis is present at L4-5. AP alignment is otherwise anatomic.   Vertebrae: Concave end plates are similar the prior studies. Chronic endplate marrow changes are worse left than right at T12-L1. Fatty  infiltration into the sacrum and iliac bones is noted.   Conus medullaris and cauda equina: Conus extends to the L1 level. Conus and cauda equina appear normal.   Paraspinal and other soft tissues: Limited imaging the abdomen is unremarkable. There is no significant adenopathy.   Disc levels:   The disc levels at L1-2 and L2-3 are unremarkable.   L3-4: A broad-based disc protrusion is present. Moderate facet hypertrophy is slightly worse. There is progressive epidural fat with crowding of the nerve roots in the thecal sac.  Mild foraminal narrowing has progressed bilaterally.   L4-5: A broad-based disc protrusion is evident. Advanced facet hypertrophy is again noted. Prominent epidural fat is present. The combination compresses the nerve roots. Mild foraminal narrowing demonstrate some progression bilaterally.   L5-S1: A broad-based disc protrusion is present. Prominent epidural fat is noted. Mild facet hypertrophy has progressed.   IMPRESSION: 1. 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 2. Progressive mild foraminal narrowing bilaterally at L3-4 and L4-5. 3. The foramina at L5-S1 are patent. 4. Grade 1 anterolisthesis at L4-5.     Electronically Signed   By: San Morelle M.D.   On: 12/21/2017 15:10  Imaging: No results found.   PMFS History: Patient Active Problem List   Diagnosis Date Noted   Cubital tunnel syndrome on left 08/26/2017    Priority: High    Class: Chronic   Atrial fibrillation ([place]) 04/18/2014   Paresthesia 02/16/2014   Neck pain 02/16/2014   Leukopenia 02/16/2013   Past Medical History:  Diagnosis Date   Arrhythmia    Atrial fibrillation ([place])    Cataract    OD   Diverticulosis    Fatty liver 06/20/13   Hyperplastic colon polyp    Hypertension    Internal hemorrhoids    Leukopenia    Neck pain    Prostate enlargement    Spinal stenosis     Family History  Problem Relation Age of Onset   Hypertension Mother    Cancer Father    Stroke Maternal Grandfather    Cancer Paternal Grandmother     Past Surgical History:  Procedure Laterality Date   CARDIOVERSION  03/2014   CATARACT EXTRACTION Left 05/14/2019   Dr. Wyatt Portela   EYE SURGERY     Flexible cystoscopy and repair of torn tunica albuginea     ULNAR NERVE TRANSPOSITION Left 08/26/2017   Procedure: LEFT ELBOW ULNAR NEUROLYSIS;  Surgeon: Jessy Oto, MD;  Location: [facility];  Service: Orthopedics;  Laterality: Left;   Social History   Occupational History    Employer: [employer]  Tobacco Use   Smoking status: Every Day    Packs/day: 0.75    Years: 40.00    Total pack years: 30.00    Types: Cigarettes   Smokeless tobacco: Never  Vaping Use   Vaping Use: Never used  Substance and Sexual Activity   Alcohol use: Yes    Comment: social   Drug use: No   Sexual activity: Not on file

## 2022-08-06 ENCOUNTER — Other Ambulatory Visit: Payer: Self-pay | Admitting: Internal Medicine

## 2022-08-07 LAB — LIPID PANEL
Cholesterol: 218 mg/dL — ABNORMAL HIGH (ref <200)
HDL: 69 mg/dL (ref >=40)
LDL Cholesterol (Calc): 132 mg/dL (calc) — ABNORMAL HIGH
Non-HDL Cholesterol (Calc): 149 mg/dL (calc) — ABNORMAL HIGH (ref <130)
Total CHOL/HDL Ratio: 3.2 (calc) (ref <5.0)
Triglycerides: 80 mg/dL (ref <150)

## 2022-08-07 LAB — COMPLETE METABOLIC PANEL WITH GFR
AG Ratio: 1.3 (calc) (ref 1.0–2.5)
ALT: 28 U/L (ref 9–46)
AST: 25 U/L (ref 10–35)
Albumin: 4.3 g/dL (ref 3.6–5.1)
Alkaline phosphatase (APISO): 82 U/L (ref 35–144)
BUN: 10 mg/dL (ref 7–25)
CO2: 27 mmol/L (ref 20–32)
Calcium: 9.8 mg/dL (ref 8.6–10.3)
Chloride: 105 mmol/L (ref 98–110)
Creat: 0.8 mg/dL (ref 0.70–1.35)
Globulin: 3.4 g/dL (calc) (ref 1.9–3.7)
Glucose, Bld: 91 mg/dL (ref 65–99)
Potassium: 4.2 mmol/L (ref 3.5–5.3)
Sodium: 141 mmol/L (ref 135–146)
Total Bilirubin: 0.3 mg/dL (ref 0.2–1.2)
Total Protein: 7.7 g/dL (ref 6.1–8.1)
eGFR: 101 mL/min/{1.73_m2} (ref >=60)

## 2022-08-07 LAB — CBC
HCT: 41.2 % (ref 38.5–50.0)
Hemoglobin: 14.3 g/dL (ref 13.2–17.1)
MCH: 32 pg (ref 27.0–33.0)
MCHC: 34.7 g/dL (ref 32.0–36.0)
MCV: 92.2 fL (ref 80.0–100.0)
MPV: 10.5 fL (ref 7.5–12.5)
Platelets: 240 10*3/uL (ref 140–400)
RBC: 4.47 10*6/uL (ref 4.20–5.80)
RDW: 12.4 % (ref 11.0–15.0)
WBC: 2.9 10*3/uL — ABNORMAL LOW (ref 3.8–10.8)

## 2022-08-07 LAB — PSA: PSA: 0.85 ng/mL (ref <=4.00)

## 2022-08-07 LAB — VITAMIN D 25 HYDROXY (VIT D DEFICIENCY, FRACTURES): Vit D, 25-Hydroxy: 12 ng/mL — ABNORMAL LOW (ref 30–100)

## 2022-08-07 LAB — TSH: TSH: 1.69 mIU/L (ref 0.40–4.50)

## 2023-01-16 ENCOUNTER — Encounter: Payer: Self-pay | Admitting: Orthopedic Surgery

## 2023-01-16 ENCOUNTER — Other Ambulatory Visit (INDEPENDENT_AMBULATORY_CARE_PROVIDER_SITE_OTHER): Payer: Medicaid Other

## 2023-01-16 ENCOUNTER — Ambulatory Visit (INDEPENDENT_AMBULATORY_CARE_PROVIDER_SITE_OTHER): Payer: Medicaid Other | Admitting: Orthopedic Surgery

## 2023-01-16 VITALS — BP 166/102 | HR 89 | Ht 74.0 in | Wt 224.2 lb

## 2023-01-16 DIAGNOSIS — M5416 Radiculopathy, lumbar region: Secondary | ICD-10-CM

## 2023-01-16 NOTE — Progress Notes (Signed)
Orthopedic Spine Surgery Office Note  Assessment: Patient is a 62 y.o. male with low back pain that radiates into bilateral posterior thighs.  Has a spondylolisthesis at L4/5.  Suspected radiculopathy   Plan: -Patient has tried tylenol, activity modification, NSAIDs, steroid injections  -Patient has tried several conservative treatments over the years but has gotten the best and long as lasting relief with steroid injections.  He wants to try another.  Referral was provided to him today -Would need to quit all nicotine containing products before any elective spine surgery -Would want a repeat lumbar MRI if surgery is ever considered as a treatment option -Patient should return to office on an as needed basis   Patient expressed understanding of the plan and all questions were answered to the patient's satisfaction.   ___________________________________________________________________________   History:  Patient is a 62 y.o. male who presents today for lumbar spine.  Patient has had several years of low back pain that radiates into the bilateral posterior thighs.  Pain is felt on a daily basis.  It is felt with or without activity. Generally, worse when more active though. Pain has gotten progressively worse within the last two months.  There is no trauma or injury that brought on the pain.  He has previously gotten injections and feels that he gets about 70% relief with those injections.  They make his pain tolerable and they last for at least 6 months.  His last injection was with Dr. Alvester Morin on 03/19/2022.  It was a left L4 transforaminal injection.  He rates his current pain as 7 out of 10.  Denies paresthesias and numbness.   Weakness: Denies Symptoms of imbalance: Denies Paresthesias and numbness: Denies Bowel or bladder incontinence: Denies Saddle anesthesia: Denies  Treatments tried: tylenol, activity modification, NSAIDs, steroid injections  Review of systems: Denies fevers and  chills, night sweats, unexplained weight loss, history of cancer. Has had pain that wakes him at night  Past medical history: Chronic pain Hypertension A-fib Diverticulosis Hypertension Internal hemorrhoids BPH  Allergies: Augmentin  Past surgical history:  None  Social history: Reports use of nicotine product (smoking, vaping, patches, smokeless) Alcohol use: Denies Denies recreational drug use   Physical Exam:  General: no acute distress, appears stated age Neurologic: alert, answering questions appropriately, following commands Respiratory: unlabored breathing on room air, symmetric chest rise Psychiatric: appropriate affect, normal cadence to speech   MSK (spine):  -Strength exam      Left  Right EHL    5/5  5/5 TA    5/5  5/5 GSC    5/5  5/5 Knee extension  5/5  5/5 Hip flexion   5/5  5/5  -Sensory exam    Sensation intact to light touch in L3-S1 nerve distributions of bilateral lower extremities  -Achilles DTR: 1/4 on the left, 1/4 on the right -Patellar tendon DTR: 1/4 on the left, 1/4 on the right  -Straight leg raise: negative bilaterally -Femoral nerve stretch test: negative bilaterally  -Clonus: no beats bilaterally  -Left hip exam: No pain through range of motion, negative Stinchfield, negative FABER -Right hip exam: No pain through range of motion, negative Stinchfield, negative FABER  Imaging: XR of the lumbar spine from 01/16/2023 was independently reviewed and interpreted, showing spondylolisthesis at L4/5 that shifts about 0.5 mm between flexion and extension views.  No significant degenerative changes.  No fracture or dislocation seen.   Patient name: Gene Anderson Patient MRN: [id number] Date of visit: 01/16/23

## 2023-01-20 ENCOUNTER — Other Ambulatory Visit (INDEPENDENT_AMBULATORY_CARE_PROVIDER_SITE_OTHER): Payer: Medicaid Other

## 2023-01-20 ENCOUNTER — Ambulatory Visit: Payer: Medicaid Other | Admitting: Orthopedic Surgery

## 2023-01-20 ENCOUNTER — Telehealth: Payer: Self-pay

## 2023-01-20 DIAGNOSIS — M25562 Pain in left knee: Secondary | ICD-10-CM | POA: Diagnosis not present

## 2023-01-20 DIAGNOSIS — M25561 Pain in right knee: Secondary | ICD-10-CM

## 2023-01-20 DIAGNOSIS — M1711 Unilateral primary osteoarthritis, right knee: Secondary | ICD-10-CM | POA: Diagnosis not present

## 2023-01-20 DIAGNOSIS — M1712 Unilateral primary osteoarthritis, left knee: Secondary | ICD-10-CM | POA: Diagnosis not present

## 2023-01-20 DIAGNOSIS — M170 Bilateral primary osteoarthritis of knee: Secondary | ICD-10-CM

## 2023-01-20 NOTE — Telephone Encounter (Signed)
Auth needed for bil knee gel  

## 2023-01-21 ENCOUNTER — Encounter: Payer: Self-pay | Admitting: Orthopedic Surgery

## 2023-01-21 NOTE — Progress Notes (Signed)
Office Visit Note   Patient: Gene Anderson           Date of Birth: [birth year]           MRN: [id number] Visit Date: 01/20/2023 Requested by: Fleet Contras, MD [address] [place],  [place] [address] PCP: Fleet Contras, MD  Subjective: Chief Complaint  Patient presents with   Right Knee - Pain   Left Knee - Pain    HPI: Gene Anderson is a 62 y.o. male who presents to the office reporting bilateral knee pain left worse than right.  Has seen Dr. Otelia Sergeant in the past.  Describes chronic pain for years.  The pain wakes him from sleep at night.  Describes swelling but no weakness.  Would like to have injections today.  Has had about 2 to 3 months of relief with injections in the past.  No prior knee surgery.  Never tried gel injection.  Does physical type work..                ROS: All systems reviewed are negative as they relate to the chief complaint within the history of present illness.  Patient denies fevers or chills.  Assessment & Plan: Visit Diagnoses:  1. Pain in both knees, unspecified chronicity     Plan: Impression is bilateral knee arthritis.  Plan is bilateral knee aspiration and injection today.  He is not interested in knee replacement.  Gel injections could be beneficial.  Come back in 3 months for clinical recheck. This patient is diagnosed with osteoarthritis of the knee(s).    Radiographs show evidence of joint space narrowing, osteophytes, subchondral sclerosis and/or subchondral cysts.  This patient has knee pain which interferes with functional and activities of daily living.    This patient has experienced inadequate response, adverse effects and/or intolerance with conservative treatments such as acetaminophen, NSAIDS, topical creams, physical therapy or regular exercise, knee bracing and/or weight loss.   This patient has experienced inadequate response or has a contraindication to intra articular steroid injections for at least 3 months.   This patient  is not scheduled to have a total knee replacement within 6 months of starting treatment with viscosupplementation.   Follow-Up Instructions: No follow-ups on file.   Orders:  Orders Placed This Encounter  Procedures   XR KNEE 3 VIEW RIGHT   XR Knee 1-2 Views Left   No orders of the defined types were placed in this encounter.     Procedures: Large Joint Inj: bilateral knee on 01/20/2023 7:36 AM Indications: diagnostic evaluation, joint swelling and pain Details: 18 G 1.5 in needle, superolateral approach  Arthrogram: No  Medications (Right): 5 mL lidocaine 1 %; 4 mL bupivacaine 0.25 %; 40 mg methylPREDNISolone acetate 40 MG/ML Medications (Left): 5 mL lidocaine 1 %; 4 mL bupivacaine 0.25 %; 40 mg methylPREDNISolone acetate 40 MG/ML Outcome: tolerated well, no immediate complications Procedure, treatment alternatives, risks and benefits explained, specific risks discussed. Consent was given by the patient. Immediately prior to procedure a time out was called to verify the correct patient, procedure, equipment, support staff and site/side marked as required. Patient was prepped and draped in the usual sterile fashion.       Clinical Data: No additional findings.  Objective: Vital Signs: There were no vitals taken for this visit.  Physical Exam:  Constitutional: Patient appears well-developed HEENT:  Head: Normocephalic Eyes:EOM are normal Neck: Normal range of motion Cardiovascular: Normal rate Pulmonary/chest: Effort normal Neurologic: Patient is alert  Skin: Skin is warm Psychiatric: Patient has normal mood and affect  Ortho Exam: Ortho exam demonstrates range of motion of both knees of 0-1 10.  Collateral and cruciate ligaments are stable.  Extensor mechanism intact.  Patellofemoral crepitus is present.  No other masses lymphadenopathy or skin changes noted in either knee region.  Pedal pulses palpable.  Specialty Comments:  MRI LUMBAR SPINE WITHOUT CONTRAST    TECHNIQUE: Multiplanar, multisequence MR imaging of the lumbar spine was performed. No intravenous contrast was administered.   COMPARISON:  12/09/2017.  MRI of the lumbar spine 02/26/2013   FINDINGS: Segmentation: 5 non rib-bearing lumbar type vertebral bodies are present.   Alignment: Grade 1 anterolisthesis is present at L4-5. AP alignment is otherwise anatomic.   Vertebrae: Concave end plates are similar the prior studies. Chronic endplate marrow changes are worse left than right at T12-L1. Fatty infiltration into the sacrum and iliac bones is noted.   Conus medullaris and cauda equina: Conus extends to the L1 level. Conus and cauda equina appear normal.   Paraspinal and other soft tissues: Limited imaging the abdomen is unremarkable. There is no significant adenopathy.   Disc levels:   The disc levels at L1-2 and L2-3 are unremarkable.   L3-4: A broad-based disc protrusion is present. Moderate facet hypertrophy is slightly worse. There is progressive epidural fat with crowding of the nerve roots in the thecal sac. Mild foraminal narrowing has progressed bilaterally.   L4-5: A broad-based disc protrusion is evident. Advanced facet hypertrophy is again noted. Prominent epidural fat is present. The combination compresses the nerve roots. Mild foraminal narrowing demonstrate some progression bilaterally.   L5-S1: A broad-based disc protrusion is present. Prominent epidural fat is noted. Mild facet hypertrophy has progressed.   IMPRESSION: 1. 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 2. Progressive mild foraminal narrowing bilaterally at L3-4 and L4-5. 3. The foramina at L5-S1 are patent. 4. Grade 1 anterolisthesis at L4-5.     Electronically Signed   By: Marin Roberts M.D.   On: 12/21/2017 15:10  Imaging: XR Knee 1-2 Views Left  Result  Date: 01/21/2023 AP lateral merchant radiographs left knee reviewed.  No acute fracture.  Moderate to severe degenerative changes present in both the medial lateral patellofemoral compartments with slight varus alignment.  XR KNEE 3 VIEW RIGHT  Result Date: 01/21/2023 AP lateral merchant radiographs right knee reviewed.  Moderate to severe tricompartmental degenerative changes present in the medial lateral and patellofemoral joints.  Slight varus alignment noted.  No acute fracture    PMFS History: Patient Active Problem List   Diagnosis Date Noted   Cubital tunnel syndrome on left 08/26/2017    Class: Chronic   Atrial fibrillation 04/18/2014   Paresthesia 02/16/2014   Neck pain 02/16/2014   Leukopenia 02/16/2013   Past Medical History:  Diagnosis Date   Arrhythmia    Atrial fibrillation    Cataract    OD   Diverticulosis    Fatty liver 06/20/13   Hyperplastic colon polyp    Hypertension    Internal hemorrhoids    Leukopenia    Neck pain    Prostate enlargement    Spinal stenosis     Family History  Problem Relation Age of Onset   Hypertension Mother    Cancer Father    Stroke Maternal Grandfather    Cancer Paternal Grandmother     Past Surgical History:  Procedure Laterality Date   CARDIOVERSION  03/2014   CATARACT EXTRACTION Left 05/14/2019   Dr. Fabian Sharp   EYE SURGERY     Flexible cystoscopy and repair of torn tunica albuginea     ULNAR NERVE TRANSPOSITION Left 08/26/2017   Procedure: LEFT ELBOW ULNAR NEUROLYSIS;  Surgeon: Kerrin Champagne, MD;  Location: [facility];  Service: Orthopedics;  Laterality: Left;   Social History   Occupational History    Employer: [employer]  Tobacco Use   Smoking status: Every Day    Packs/day: 0.75    Years: 40.00    Additional pack years: 0.00    Total pack years: 30.00    Types: Cigarettes   Smokeless tobacco: Never  Vaping Use   Vaping Use: Never used  Substance and Sexual Activity    Alcohol use: Yes    Comment: social   Drug use: No   Sexual activity: Not on file

## 2023-01-22 ENCOUNTER — Other Ambulatory Visit: Payer: Self-pay

## 2023-01-22 ENCOUNTER — Ambulatory Visit (INDEPENDENT_AMBULATORY_CARE_PROVIDER_SITE_OTHER): Payer: Medicaid Other | Admitting: Physical Medicine and Rehabilitation

## 2023-01-22 VITALS — BP 148/89 | HR 88

## 2023-01-22 DIAGNOSIS — M5416 Radiculopathy, lumbar region: Secondary | ICD-10-CM

## 2023-01-22 MED ORDER — METHYLPREDNISOLONE ACETATE 80 MG/ML IJ SUSP
80.0000 mg | Freq: Once | INTRAMUSCULAR | Status: AC
  Administered 2023-01-22: 80 mg

## 2023-01-22 NOTE — Telephone Encounter (Signed)
Called and left a VM advising patient that Medicaid does not cover any gel injections.  Advised of TriVisc and can call back to discuss further if wanting to proceed with this option.

## 2023-01-22 NOTE — Patient Instructions (Signed)

## 2023-01-22 NOTE — Progress Notes (Signed)
Functional Pain Scale - descriptive words and definitions  Distracting (5)    Aware of pain/able to complete some ADL's but limited by pain/sleep is affected and active distractions are only slightly useful. Moderate range order  Average Pain 6-7   +Driver, -BT, -Dye Allergies.  Lower back pain on left side that radiates down the back of the legs to the knee

## 2023-01-24 MED ORDER — BUPIVACAINE HCL 0.25 % IJ SOLN
4.0000 mL | INTRAMUSCULAR | Status: AC | PRN
Start: 2023-01-20 — End: 2023-01-20
  Administered 2023-01-20: 4 mL via INTRA_ARTICULAR

## 2023-01-24 MED ORDER — METHYLPREDNISOLONE ACETATE 40 MG/ML IJ SUSP
40.0000 mg | INTRAMUSCULAR | Status: AC | PRN
Start: 2023-01-20 — End: 2023-01-20
  Administered 2023-01-20: 40 mg via INTRA_ARTICULAR

## 2023-01-24 MED ORDER — BUPIVACAINE HCL 0.25 % IJ SOLN
4.0000 mL | INTRAMUSCULAR | Status: AC | PRN
  Administered 2023-01-20: 4 mL via INTRA_ARTICULAR

## 2023-01-24 MED ORDER — LIDOCAINE HCL 1 % IJ SOLN
5.0000 mL | INTRAMUSCULAR | Status: AC | PRN
  Administered 2023-01-20: 5 mL

## 2023-01-28 NOTE — Procedures (Signed)
Lumbosacral Transforaminal Epidural Steroid Injection - Sub-Pedicular Approach with Fluoroscopic Guidance  Patient: Gene Anderson      Date of Birth: [birth year] MRN: [id number] PCP: Fleet Contras, MD      Visit Date: 01/22/2023   Universal Protocol:    Date/Time: 01/22/2023  Consent Given By: the patient  Position: PRONE  Additional Comments: Vital signs were monitored before and after the procedure. Patient was prepped and draped in the usual sterile fashion. The correct patient, procedure, and site was verified.   Injection Procedure Details:   Procedure diagnoses: Lumbar radiculopathy [M54.16]    Meds Administered:  Meds ordered this encounter  Medications   methylPREDNISolone acetate (DEPO-MEDROL) injection 80 mg    Laterality: Left  Location/Site: L4  Needle:5.0 in., 22 ga.  Short bevel or Quincke spinal needle  Needle Placement: Transforaminal  Findings:    -Comments: Excellent flow of contrast along the nerve, nerve root and into the epidural space.  Procedure Details: After squaring off the end-plates to get a true AP view, the C-arm was positioned so that an oblique view of the foramen as noted above was visualized. The target area is just inferior to the "nose of the scotty dog" or sub pedicular. The soft tissues overlying this structure were infiltrated with 2-3 ml. of 1% Lidocaine without Epinephrine.  The spinal needle was inserted toward the target using a "trajectory" view along the fluoroscope beam.  Under AP and lateral visualization, the needle was advanced so it did not puncture dura and was located close the 6 O'Clock position of the pedical in AP tracterory. Biplanar projections were used to confirm position. Aspiration was confirmed to be negative for CSF and/or blood. A 1-2 ml. volume of Isovue-250 was injected and flow of contrast was noted at each level. Radiographs were obtained for documentation purposes.   After attaining the desired flow  of contrast documented above, a 0.5 to 1.0 ml test dose of 0.25% Marcaine was injected into each respective transforaminal space.  The patient was observed for 90 seconds post injection.  After no sensory deficits were reported, and normal lower extremity motor function was noted,   the above injectate was administered so that equal amounts of the injectate were placed at each foramen (level) into the transforaminal epidural space.   Additional Comments:  No complications occurred Dressing: 2 x 2 sterile gauze and Band-Aid    Post-procedure details: Patient was observed during the procedure. Post-procedure instructions were reviewed.  Patient left the clinic in stable condition.

## 2023-01-28 NOTE — Progress Notes (Signed)
Gene Anderson - 62 y.o. male MRN [id number]  Date of birth: 12/21/1960  Office Visit Note: Visit Date: 01/22/2023 PCP: Fleet Contras, MD Referred by: London Sheer, MD  Subjective: Chief Complaint  Patient presents with   Lower Back - Pain   HPI:  Gene Anderson is a 62 y.o. male who comes in today at the request of Dr. Willia Craze for planned Left L4-5 Lumbar Transforaminal epidural steroid injection with fluoroscopic guidance.  The patient has failed conservative care including home exercise, medications, time and activity modification.  This injection will be diagnostic and hopefully therapeutic.  Please see requesting physician notes for further details and justification.   ROS Otherwise per HPI.  Assessment & Plan: Visit Diagnoses:    ICD-10-CM   1. Lumbar radiculopathy  M54.16 XR C-ARM NO REPORT    Epidural Steroid injection    methylPREDNISolone acetate (DEPO-MEDROL) injection 80 mg      Plan: No additional findings.   Meds & Orders:  Meds ordered this encounter  Medications   methylPREDNISolone acetate (DEPO-MEDROL) injection 80 mg    Orders Placed This Encounter  Procedures   XR C-ARM NO REPORT   Epidural Steroid injection    Follow-up: Return for visit to requesting provider as needed.   Procedures: No procedures performed  Lumbosacral Transforaminal Epidural Steroid Injection - Sub-Pedicular Approach with Fluoroscopic Guidance  Patient: Gene Anderson      Date of Birth: Apr 08, 1961 MRN: [id number] PCP: Fleet Contras, MD      Visit Date: 01/22/2023   Universal Protocol:    Date/Time: 01/22/2023  Consent Given By: the patient  Position: PRONE  Additional Comments: Vital signs were monitored before and after the procedure. Patient was prepped and draped in the usual sterile fashion. The correct patient, procedure, and site was verified.   Injection Procedure Details:   Procedure diagnoses: Lumbar radiculopathy [M54.16]    Meds  Administered:  Meds ordered this encounter  Medications   methylPREDNISolone acetate (DEPO-MEDROL) injection 80 mg    Laterality: Left  Location/Site: L4  Needle:5.0 in., 22 ga.  Short bevel or Quincke spinal needle  Needle Placement: Transforaminal  Findings:    -Comments: Excellent flow of contrast along the nerve, nerve root and into the epidural space.  Procedure Details: After squaring off the end-plates to get a true AP view, the C-arm was positioned so that an oblique view of the foramen as noted above was visualized. The target area is just inferior to the "nose of the scotty dog" or sub pedicular. The soft tissues overlying this structure were infiltrated with 2-3 ml. of 1% Lidocaine without Epinephrine.  The spinal needle was inserted toward the target using a "trajectory" view along the fluoroscope beam.  Under AP and lateral visualization, the needle was advanced so it did not puncture dura and was located close the 6 O'Clock position of the pedical in AP tracterory. Biplanar projections were used to confirm position. Aspiration was confirmed to be negative for CSF and/or blood. A 1-2 ml. volume of Isovue-250 was injected and flow of contrast was noted at each level. Radiographs were obtained for documentation purposes.   After attaining the desired flow of contrast documented above, a 0.5 to 1.0 ml test dose of 0.25% Marcaine was injected into each respective transforaminal space.  The patient was observed for 90 seconds post injection.  After no sensory deficits were reported, and normal lower extremity motor function was noted,   the above injectate was administered  so that equal amounts of the injectate were placed at each foramen (level) into the transforaminal epidural space.   Additional Comments:  No complications occurred Dressing: 2 x 2 sterile gauze and Band-Aid    Post-procedure details: Patient was observed during the procedure. Post-procedure instructions  were reviewed.  Patient left the clinic in stable condition.    Clinical History: MRI LUMBAR SPINE WITHOUT CONTRAST   TECHNIQUE: Multiplanar, multisequence MR imaging of the lumbar spine was performed. No intravenous contrast was administered.   COMPARISON:  12/09/2017.  MRI of the lumbar spine 02/26/2013   FINDINGS: Segmentation: 5 non rib-bearing lumbar type vertebral bodies are present.   Alignment: Grade 1 anterolisthesis is present at L4-5. AP alignment is otherwise anatomic.   Vertebrae: Concave end plates are similar the prior studies. Chronic endplate marrow changes are worse left than right at T12-L1. Fatty infiltration into the sacrum and iliac bones is noted.   Conus medullaris and cauda equina: Conus extends to the L1 level. Conus and cauda equina appear normal.   Paraspinal and other soft tissues: Limited imaging the abdomen is unremarkable. There is no significant adenopathy.   Disc levels:   The disc levels at L1-2 and L2-3 are unremarkable.   L3-4: A broad-based disc protrusion is present. Moderate facet hypertrophy is slightly worse. There is progressive epidural fat with crowding of the nerve roots in the thecal sac. Mild foraminal narrowing has progressed bilaterally.   L4-5: A broad-based disc protrusion is evident. Advanced facet hypertrophy is again noted. Prominent epidural fat is present. The combination compresses the nerve roots. Mild foraminal narrowing demonstrate some progression bilaterally.   L5-S1: A broad-based disc protrusion is present. Prominent epidural fat is noted. Mild facet hypertrophy has progressed.   IMPRESSION: 1. 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 2. Progressive mild foraminal narrowing bilaterally at L3-4 and L4-5. 3. The foramina at L5-S1 are patent. 4. Grade 1 anterolisthesis at L4-5.      Electronically Signed   By: Marin Roberts M.D.   On: 12/21/2017 15:10     Objective:  VS:  HT:    WT:   BMI:     BP:(!) 148/89  HR:88bpm  TEMP: ( )  RESP:  Physical Exam Vitals and nursing note reviewed.  Constitutional:      General: He is not in acute distress.    Appearance: Normal appearance. He is not ill-appearing.  HENT:     Head: Normocephalic and atraumatic.     Right Ear: External ear normal.     Left Ear: External ear normal.     Nose: No congestion.  Eyes:     Extraocular Movements: Extraocular movements intact.  Cardiovascular:     Rate and Rhythm: Normal rate.     Pulses: Normal pulses.  Pulmonary:     Effort: Pulmonary effort is normal. No respiratory distress.  Abdominal:     General: There is no distension.     Palpations: Abdomen is soft.  Musculoskeletal:        General: No tenderness or signs of injury.     Cervical back: Neck supple.     Right lower leg: No edema.     Left lower leg: No edema.     Comments: Patient has good distal strength without clonus.  Skin:    Findings: No erythema or rash.  Neurological:     General: No focal deficit present.  Mental Status: He is alert and oriented to person, place, and time.     Sensory: No sensory deficit.     Motor: No weakness or abnormal muscle tone.     Coordination: Coordination normal.  Psychiatric:        Mood and Affect: Mood normal.        Behavior: Behavior normal.      Imaging: No results found.

## 2023-04-21 ENCOUNTER — Ambulatory Visit: Payer: Medicaid Other | Admitting: Orthopedic Surgery

## 2023-04-21 ENCOUNTER — Other Ambulatory Visit: Payer: Self-pay

## 2023-04-21 DIAGNOSIS — M5416 Radiculopathy, lumbar region: Secondary | ICD-10-CM

## 2023-04-21 DIAGNOSIS — M1711 Unilateral primary osteoarthritis, right knee: Secondary | ICD-10-CM | POA: Diagnosis not present

## 2023-04-21 DIAGNOSIS — M1712 Unilateral primary osteoarthritis, left knee: Secondary | ICD-10-CM

## 2023-04-21 DIAGNOSIS — M170 Bilateral primary osteoarthritis of knee: Secondary | ICD-10-CM

## 2023-04-21 NOTE — Progress Notes (Signed)
Auth needed for bilat knee gel  

## 2023-04-22 ENCOUNTER — Encounter: Payer: Self-pay | Admitting: Orthopedic Surgery

## 2023-04-22 NOTE — Progress Notes (Signed)
Will call patient to advise that Medicaid does not cover gel injections.

## 2023-04-22 NOTE — Progress Notes (Unsigned)
Office Visit Note   Patient: Gene Anderson           Date of Birth: March 11, 1961           MRN: [id number] Visit Date: 04/21/2023 Requested by: Fleet Contras, MD [address] [place],  [place] [address] PCP: Fleet Contras, MD  Subjective: Chief Complaint  Patient presents with   Left Knee - Pain   Right Knee - Pain    HPI: Gene Anderson is a 62 y.o. male who presents to the office reporting.  Is a 62 year old patient with bilateral knee pain left worse than right.  Did have bilateral knee injections 01/19/2022 and got good relief for 2 months.  Would like to repeat the injections today.  The pain does wake him from sleep at night.  Does describe some mechanical symptoms in both knees.  Would also like repeat epidural steroid injection in his back with Dr. Alvester Morin..                ROS: All systems reviewed are negative as they relate to the chief complaint within the history of present illness.  Patient denies fevers or chills.  Assessment & Plan: Visit Diagnoses:  1. Unilateral primary osteoarthritis, left knee   2. Unilateral primary osteoarthritis, right knee     Plan: Impression is bilateral knee arthritis with good relief from injections.  This would be the final cortisone injection that we would do this year.  I think he may be a good candidate for gel injections.  Would not plan on getting knee replacements anytime soon per patient report.  Refer to Dr. Alvester Morin for repeat lumbar ESI. This patient is diagnosed with osteoarthritis of the knee(s).    Radiographs show evidence of joint space narrowing, osteophytes, subchondral sclerosis and/or subchondral cysts.  This patient has knee pain which interferes with functional and activities of daily living.    This patient has experienced inadequate response, adverse effects and/or intolerance with conservative treatments such as acetaminophen, NSAIDS, topical creams, physical therapy or regular exercise, knee bracing and/or weight  loss.   This patient has experienced inadequate response or has a contraindication to intra articular steroid injections for at least 3 months.   This patient is not scheduled to have a total knee replacement within 6 months of starting treatment with viscosupplementation.   Follow-Up Instructions: No follow-ups on file.   Orders:  No orders of the defined types were placed in this encounter.  No orders of the defined types were placed in this encounter.     Procedures: Large Joint Inj: bilateral knee on 04/21/2023 11:02 PM Indications: diagnostic evaluation, joint swelling and pain Details: 18 G 1.5 in needle, superolateral approach  Arthrogram: No  Medications (Right): 5 mL lidocaine 1 %; 4 mL bupivacaine 0.25 %; 40 mg methylPREDNISolone acetate 40 MG/ML Medications (Left): 5 mL lidocaine 1 %; 4 mL bupivacaine 0.25 %; 40 mg methylPREDNISolone acetate 40 MG/ML Outcome: tolerated well, no immediate complications Procedure, treatment alternatives, risks and benefits explained, specific risks discussed. Consent was given by the patient. Immediately prior to procedure a time out was called to verify the correct patient, procedure, equipment, support staff and site/side marked as required. Patient was prepped and draped in the usual sterile fashion.       Clinical Data: No additional findings.  Objective: Vital Signs: There were no vitals taken for this visit.  Physical Exam:  Constitutional: Patient appears well-developed HEENT:  Head: Normocephalic Eyes:EOM are normal Neck: Normal  range of motion Cardiovascular: Normal rate Pulmonary/chest: Effort normal Neurologic: Patient is alert Skin: Skin is warm Psychiatric: Patient has normal mood and affect  Ortho Exam: Ortho exam demonstrates trace knee effusions.  Intact extensor mechanism.  Slight flexion contracture in both knees.  Pedal pulses palpable.  No groin pain on the right or left-hand side with internal or external  rotation of the hip.  Range of motion is flexion past 90.  Collateral cruciate ligaments are stable.  No definite nerve root tension signs or ankle dorsiflexion or plantarflexion weakness today.  Specialty Comments:  MRI LUMBAR SPINE WITHOUT CONTRAST   TECHNIQUE: Multiplanar, multisequence MR imaging of the lumbar spine was performed. No intravenous contrast was administered.   COMPARISON:  12/09/2017.  MRI of the lumbar spine 02/26/2013   FINDINGS: Segmentation: 5 non rib-bearing lumbar type vertebral bodies are present.   Alignment: Grade 1 anterolisthesis is present at L4-5. AP alignment is otherwise anatomic.   Vertebrae: Concave end plates are similar the prior studies. Chronic endplate marrow changes are worse left than right at T12-L1. Fatty infiltration into the sacrum and iliac bones is noted.   Conus medullaris and cauda equina: Conus extends to the L1 level. Conus and cauda equina appear normal.   Paraspinal and other soft tissues: Limited imaging the abdomen is unremarkable. There is no significant adenopathy.   Disc levels:   The disc levels at L1-2 and L2-3 are unremarkable.   L3-4: A broad-based disc protrusion is present. Moderate facet hypertrophy is slightly worse. There is progressive epidural fat with crowding of the nerve roots in the thecal sac. Mild foraminal narrowing has progressed bilaterally.   L4-5: A broad-based disc protrusion is evident. Advanced facet hypertrophy is again noted. Prominent epidural fat is present. The combination compresses the nerve roots. Mild foraminal narrowing demonstrate some progression bilaterally.   L5-S1: A broad-based disc protrusion is present. Prominent epidural fat is noted. Mild facet hypertrophy has progressed.   IMPRESSION: 1. 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 2. Progressive mild  foraminal narrowing bilaterally at L3-4 and L4-5. 3. The foramina at L5-S1 are patent. 4. Grade 1 anterolisthesis at L4-5.     Electronically Signed   By: Marin Roberts M.D.   On: 12/21/2017 15:10  Imaging: No results found.   PMFS History: Patient Active Problem List   Diagnosis Date Noted   Cubital tunnel syndrome on left 08/26/2017    Class: Chronic   Atrial fibrillation (HCC) 04/18/2014   Paresthesia 02/16/2014   Neck pain 02/16/2014   Leukopenia 02/16/2013   Past Medical History:  Diagnosis Date   Arrhythmia    Atrial fibrillation (HCC)    Cataract    OD   Diverticulosis    Fatty liver 06/20/13   Hyperplastic colon polyp    Hypertension    Internal hemorrhoids    Leukopenia    Neck pain    Prostate enlargement    Spinal stenosis     Family History  Problem Relation Age of Onset   Hypertension Mother    Cancer Father    Stroke Maternal Grandfather    Cancer Paternal Grandmother     Past Surgical History:  Procedure Laterality Date   CARDIOVERSION  03/2014   CATARACT EXTRACTION Left 05/14/2019   Dr. Fabian Sharp   EYE SURGERY     Flexible cystoscopy and repair of torn tunica albuginea     ULNAR  NERVE TRANSPOSITION Left 08/26/2017   Procedure: LEFT ELBOW ULNAR NEUROLYSIS;  Surgeon: Kerrin Champagne, MD;  Location: [facility];  Service: Orthopedics;  Laterality: Left;   Social History   Occupational History    Employer: [employer]  Tobacco Use   Smoking status: Every Day    Current packs/day: 0.75    Average packs/day: 0.8 packs/day for 40.0 years (30.0 ttl pk-yrs)    Types: Cigarettes   Smokeless tobacco: Never  Vaping Use   Vaping status: Never Used  Substance and Sexual Activity   Alcohol use: Yes    Comment: social   Drug use: No   Sexual activity: Not on file

## 2023-04-23 MED ORDER — METHYLPREDNISOLONE ACETATE 40 MG/ML IJ SUSP
40.0000 mg | INTRAMUSCULAR | Status: AC | PRN
  Administered 2023-04-21: 40 mg via INTRA_ARTICULAR

## 2023-04-23 MED ORDER — LIDOCAINE HCL 1 % IJ SOLN
5.0000 mL | INTRAMUSCULAR | Status: AC | PRN
  Administered 2023-04-21: 5 mL

## 2023-04-23 MED ORDER — BUPIVACAINE HCL 0.25 % IJ SOLN
4.0000 mL | INTRAMUSCULAR | Status: AC | PRN
  Administered 2023-04-21: 4 mL via INTRA_ARTICULAR

## 2023-04-23 NOTE — Progress Notes (Signed)
Talked with patient concerning Medicaid not covering gel injections.  Advised patient about TriVisc and patient stated that he will let us know at his next office visit.

## 2023-04-28 ENCOUNTER — Ambulatory Visit: Payer: Medicaid Other | Admitting: Orthopedic Surgery

## 2023-04-28 DIAGNOSIS — M5416 Radiculopathy, lumbar region: Secondary | ICD-10-CM | POA: Diagnosis not present

## 2023-04-28 NOTE — Progress Notes (Signed)
Orthopedic Spine Surgery Office Note   Assessment: Patient is a 62 y.o. male with low back pain that radiates into bilateral posterior thighs.  Has a spondylolisthesis at L4/5.  Suspected radiculopathy     Plan: -Patient has tried tylenol, activity modification, NSAIDs, lumbar steroid injections  -Can use Tylenol 1000 mg 3 times daily -Explained that he is not a surgical candidate at this time given his nicotine use.  He has gotten relief with injections and was interested in another as he works on quitting nicotine -Would want a repeat lumbar MRI if surgery is ever considered as a treatment option -Patient should return to office on an as needed basis     Patient expressed understanding of the plan and all questions were answered to the patient's satisfaction.    ___________________________________________________________________________     History:   Patient is a 62 y.o. male who presents today for follow up his lumbar spine.  He is still having low back pain that radiates into the bilateral thighs.  He feels along the posterior aspect of the thighs.  He notes that the pain is worse in his left thigh than his right.  Pain is similar to the last time I saw him.  He noted that the injection that was done in April was helpful.  He said he got significant relief for about 2.5 months.  Then, pain gradually returned and is now back to how it was before the injection.  Denies paresthesia numbness.  No pain radiating past the knees.  No bowel or bladder incontinence.  No saddle anesthesia.    Treatments tried: tylenol, activity modification, NSAIDs, lumbar steroid injections     Physical Exam:   General: no acute distress, appears stated age Neurologic: alert, answering questions appropriately, following commands Respiratory: unlabored breathing on room air, symmetric chest rise Psychiatric: appropriate affect, normal cadence to speech     MSK (spine):   -Strength exam                                                    Left                  Right EHL                              5/5                  5/5 TA                                 5/5                  5/5 GSC                             5/5                  5/5 Knee extension            5/5                  5/5 Hip flexion  5/5                  5/5   -Sensory exam                           Sensation intact to light touch in L3-S1 nerve distributions of bilateral lower extremities   -Achilles DTR: 1/4 on the left, 1/4 on the right -Patellar tendon DTR: 1/4 on the left, 1/4 on the right   -Straight leg raise: negative bilaterally -Femoral nerve stretch test: negative bilaterally  -Clonus: no beats bilaterally   -Left hip exam: No pain through range of motion, negative Stinchfield, negative FABER -Right hip exam: No pain through range of motion, negative Stinchfield, negative FABER   Imaging: XR of the lumbar spine from 01/16/2023 was previously independently reviewed and interpreted, showing spondylolisthesis at L4/5 that shifts about 0.5 mm between flexion and extension views.  No significant degenerative changes.  No fracture or dislocation seen.     Patient name: Gene Anderson Patient MRN: [id number] Date of visit: 04/28/23

## 2023-05-07 ENCOUNTER — Encounter: Payer: Medicaid Other | Admitting: Physical Medicine and Rehabilitation

## 2023-05-08 ENCOUNTER — Ambulatory Visit (INDEPENDENT_AMBULATORY_CARE_PROVIDER_SITE_OTHER): Payer: Medicaid Other | Admitting: Physical Medicine and Rehabilitation

## 2023-05-08 ENCOUNTER — Other Ambulatory Visit: Payer: Self-pay

## 2023-05-08 VITALS — BP 153/88 | HR 75

## 2023-05-08 DIAGNOSIS — M5416 Radiculopathy, lumbar region: Secondary | ICD-10-CM

## 2023-05-08 MED ORDER — METHYLPREDNISOLONE ACETATE 80 MG/ML IJ SUSP
80.0000 mg | Freq: Once | INTRAMUSCULAR | Status: AC
  Administered 2023-05-08: 80 mg

## 2023-05-08 NOTE — Patient Instructions (Signed)

## 2023-05-08 NOTE — Progress Notes (Signed)
Functional Pain Scale - descriptive words and definitions  Moderate (4)   Constantly aware of pain, can complete ADLs with modification/sleep marginally affected at times/passive distraction is of no use, but active distraction gives some relief. Moderate range order  Average Pain 6   +Driver, -BT, -Dye Allergies.  Lower back pain on the left side. Pain is relieved by hot showers and heating pad

## 2023-05-21 NOTE — Procedures (Signed)
Lumbosacral Transforaminal Epidural Steroid Injection - Sub-Pedicular Approach with Fluoroscopic Guidance  Patient: Gene Anderson      Date of Birth: 10-25-1960 MRN: [id number] PCP: Fleet Contras, MD      Visit Date: 05/08/2023   Universal Protocol:    Date/Time: 05/08/2023  Consent Given By: the patient  Position: PRONE  Additional Comments: Vital signs were monitored before and after the procedure. Patient was prepped and draped in the usual sterile fashion. The correct patient, procedure, and site was verified.   Injection Procedure Details:   Procedure diagnoses: Lumbar radiculopathy [M54.16]    Meds Administered:  Meds ordered this encounter  Medications   methylPREDNISolone acetate (DEPO-MEDROL) injection 80 mg    Laterality: Left  Location/Site: L4  Needle:5.0 in., 22 ga.  Short bevel or Quincke spinal needle  Needle Placement: Transforaminal  Findings:    -Comments: Excellent flow of contrast along the nerve, nerve root and into the epidural space.  Procedure Details: After squaring off the end-plates to get a true AP view, the C-arm was positioned so that an oblique view of the foramen as noted above was visualized. The target area is just inferior to the "nose of the scotty dog" or sub pedicular. The soft tissues overlying this structure were infiltrated with 2-3 ml. of 1% Lidocaine without Epinephrine.  The spinal needle was inserted toward the target using a "trajectory" view along the fluoroscope beam.  Under AP and lateral visualization, the needle was advanced so it did not puncture dura and was located close the 6 O'Clock position of the pedical in AP tracterory. Biplanar projections were used to confirm position. Aspiration was confirmed to be negative for CSF and/or blood. A 1-2 ml. volume of Isovue-250 was injected and flow of contrast was noted at each level. Radiographs were obtained for documentation purposes.   After attaining the desired flow  of contrast documented above, a 0.5 to 1.0 ml test dose of 0.25% Marcaine was injected into each respective transforaminal space.  The patient was observed for 90 seconds post injection.  After no sensory deficits were reported, and normal lower extremity motor function was noted,   the above injectate was administered so that equal amounts of the injectate were placed at each foramen (level) into the transforaminal epidural space.   Additional Comments:  No complications occurred Dressing: 2 x 2 sterile gauze and Band-Aid    Post-procedure details: Patient was observed during the procedure. Post-procedure instructions were reviewed.  Patient left the clinic in stable condition.

## 2023-05-21 NOTE — Progress Notes (Signed)
[name] - 62 y.o. male MRN [id number]  Date of birth: 1961-03-26  Office Visit Note: Visit Date: 05/08/2023 PCP: Fleet Contras, MD Referred by: London Sheer, MD  Subjective: Chief Complaint  Patient presents with   Lower Back - Pain   HPI:  Gene Anderson is a 62 y.o. male who comes in today at the request of Dr. Willia Craze for planned Left L4-5 Lumbar Transforaminal epidural steroid injection with fluoroscopic guidance.  The patient has failed conservative care including home exercise, medications, time and activity modification.  This injection will be diagnostic and hopefully therapeutic.  Please see requesting physician notes for further details and justification.   ROS Otherwise per HPI.  Assessment & Plan: Visit Diagnoses:    ICD-10-CM   1. Lumbar radiculopathy  M54.16 XR C-ARM NO REPORT    Epidural Steroid injection    methylPREDNISolone acetate (DEPO-MEDROL) injection 80 mg      Plan: No additional findings.   Meds & Orders:  Meds ordered this encounter  Medications   methylPREDNISolone acetate (DEPO-MEDROL) injection 80 mg    Orders Placed This Encounter  Procedures   XR C-ARM NO REPORT   Epidural Steroid injection    Follow-up: Return for visit to requesting provider as needed.   Procedures: No procedures performed  Lumbosacral Transforaminal Epidural Steroid Injection - Sub-Pedicular Approach with Fluoroscopic Guidance  Patient: Gene Anderson      Date of Birth: 05/12/1961 MRN: [id number] PCP: Fleet Contras, MD      Visit Date: 05/08/2023   Universal Protocol:    Date/Time: 05/08/2023  Consent Given By: the patient  Position: PRONE  Additional Comments: Vital signs were monitored before and after the procedure. Patient was prepped and draped in the usual sterile fashion. The correct patient, procedure, and site was verified.   Injection Procedure Details:   Procedure diagnoses: Lumbar radiculopathy [M54.16]    Meds  Administered:  Meds ordered this encounter  Medications   methylPREDNISolone acetate (DEPO-MEDROL) injection 80 mg    Laterality: Left  Location/Site: L4  Needle:5.0 in., 22 ga.  Short bevel or Quincke spinal needle  Needle Placement: Transforaminal  Findings:    -Comments: Excellent flow of contrast along the nerve, nerve root and into the epidural space.  Procedure Details: After squaring off the end-plates to get a true AP view, the C-arm was positioned so that an oblique view of the foramen as noted above was visualized. The target area is just inferior to the "nose of the scotty dog" or sub pedicular. The soft tissues overlying this structure were infiltrated with 2-3 ml. of 1% Lidocaine without Epinephrine.  The spinal needle was inserted toward the target using a "trajectory" view along the fluoroscope beam.  Under AP and lateral visualization, the needle was advanced so it did not puncture dura and was located close the 6 O'Clock position of the pedical in AP tracterory. Biplanar projections were used to confirm position. Aspiration was confirmed to be negative for CSF and/or blood. A 1-2 ml. volume of Isovue-250 was injected and flow of contrast was noted at each level. Radiographs were obtained for documentation purposes.   After attaining the desired flow of contrast documented above, a 0.5 to 1.0 ml test dose of 0.25% Marcaine was injected into each respective transforaminal space.  The patient was observed for 90 seconds post injection.  After no sensory deficits were reported, and normal lower extremity motor function was noted,   the above injectate was administered  so that equal amounts of the injectate were placed at each foramen (level) into the transforaminal epidural space.   Additional Comments:  No complications occurred Dressing: 2 x 2 sterile gauze and Band-Aid    Post-procedure details: Patient was observed during the procedure. Post-procedure instructions  were reviewed.  Patient left the clinic in stable condition.    Clinical History: MRI LUMBAR SPINE WITHOUT CONTRAST   TECHNIQUE: Multiplanar, multisequence MR imaging of the lumbar spine was performed. No intravenous contrast was administered.   COMPARISON:  12/09/2017.  MRI of the lumbar spine 02/26/2013   FINDINGS: Segmentation: 5 non rib-bearing lumbar type vertebral bodies are present.   Alignment: Grade 1 anterolisthesis is present at L4-5. AP alignment is otherwise anatomic.   Vertebrae: Concave end plates are similar the prior studies. Chronic endplate marrow changes are worse left than right at T12-L1. Fatty infiltration into the sacrum and iliac bones is noted.   Conus medullaris and cauda equina: Conus extends to the L1 level. Conus and cauda equina appear normal.   Paraspinal and other soft tissues: Limited imaging the abdomen is unremarkable. There is no significant adenopathy.   Disc levels:   The disc levels at L1-2 and L2-3 are unremarkable.   L3-4: A broad-based disc protrusion is present. Moderate facet hypertrophy is slightly worse. There is progressive epidural fat with crowding of the nerve roots in the thecal sac. Mild foraminal narrowing has progressed bilaterally.   L4-5: A broad-based disc protrusion is evident. Advanced facet hypertrophy is again noted. Prominent epidural fat is present. The combination compresses the nerve roots. Mild foraminal narrowing demonstrate some progression bilaterally.   L5-S1: A broad-based disc protrusion is present. Prominent epidural fat is noted. Mild facet hypertrophy has progressed.   IMPRESSION: 1. 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 2. Progressive mild foraminal narrowing bilaterally at L3-4 and L4-5. 3. The foramina at L5-S1 are patent. 4. Grade 1 anterolisthesis at L4-5.      Electronically Signed   By: Marin Roberts M.D.   On: 12/21/2017 15:10     Objective:  VS:  HT:    WT:   BMI:     BP:(!) 153/88  HR:75bpm  TEMP: ( )  RESP:  Physical Exam Vitals and nursing note reviewed.  Constitutional:      General: He is not in acute distress.    Appearance: Normal appearance. He is not ill-appearing.  HENT:     Head: Normocephalic and atraumatic.     Right Ear: External ear normal.     Left Ear: External ear normal.     Nose: No congestion.  Eyes:     Extraocular Movements: Extraocular movements intact.  Cardiovascular:     Rate and Rhythm: Normal rate.     Pulses: Normal pulses.  Pulmonary:     Effort: Pulmonary effort is normal. No respiratory distress.  Abdominal:     General: There is no distension.     Palpations: Abdomen is soft.  Musculoskeletal:        General: No tenderness or signs of injury.     Cervical back: Neck supple.     Right lower leg: No edema.     Left lower leg: No edema.     Comments: Patient has good distal strength without clonus.  Skin:    Findings: No erythema or rash.  Neurological:     General: No focal deficit present.  Mental Status: He is alert and oriented to person, place, and time.     Sensory: No sensory deficit.     Motor: No weakness or abnormal muscle tone.     Coordination: Coordination normal.  Psychiatric:        Mood and Affect: Mood normal.        Behavior: Behavior normal.      Imaging: No results found.

## 2023-06-17 NOTE — Progress Notes (Signed)
Simple treatment

## 2023-07-28 ENCOUNTER — Ambulatory Visit: Payer: Medicaid Other | Admitting: Surgical

## 2023-08-20 ENCOUNTER — Ambulatory Visit: Payer: Medicaid Other | Admitting: Orthopedic Surgery

## 2023-08-20 DIAGNOSIS — M170 Bilateral primary osteoarthritis of knee: Secondary | ICD-10-CM | POA: Diagnosis not present

## 2023-08-20 DIAGNOSIS — M1712 Unilateral primary osteoarthritis, left knee: Secondary | ICD-10-CM

## 2023-08-20 DIAGNOSIS — M1711 Unilateral primary osteoarthritis, right knee: Secondary | ICD-10-CM

## 2023-08-22 ENCOUNTER — Encounter: Payer: Self-pay | Admitting: Orthopedic Surgery

## 2023-08-22 NOTE — Progress Notes (Unsigned)
Office Visit Note   Patient: Gene Anderson           Date of Birth: 05/23/1961           MRN: [id number] Visit Date: 08/20/2023 Requested by: Cammy Copa, MD [address] [place],  [place] [address] PCP: Fleet Contras, MD  Subjective: Chief Complaint  Patient presents with   Other     Bilateral knee pain     HPI: Gene Anderson is a 62 y.o. male who presents to the office reporting bilateral knee pain.  Patient states that he wants injections.  Had bilateral cortisone injections in April and July of this year.  Left knee hurts worse than the right.  Denies a history of interval injury.  He is able to walk around but his knee pain is gradually getting worse..                ROS: All systems reviewed are negative as they relate to the chief complaint within the history of present illness.  Patient denies fevers or chills.  Assessment & Plan: Visit Diagnoses: No diagnosis found.  Plan: Impression is bilateral knee arthritis with effusions in each knee today.  Bilateral aspiration and injection is performed.  We did get about 45 cc out of each knee.  I think he is heading for knee replacement at sometime in the future but we will see if we can keep him going as long as possible with not more than 3 injections/year in each knee.  Follow-Up Instructions: No follow-ups on file.   Orders:  No orders of the defined types were placed in this encounter.  No orders of the defined types were placed in this encounter.     Procedures: Large Joint Inj: bilateral knee on 08/20/2023 5:20 PM Indications: diagnostic evaluation, joint swelling and pain Details: 18 G 1.5 in needle, superolateral approach  Arthrogram: No  Medications (Right): 5 mL lidocaine 1 %; 4 mL bupivacaine 0.25 %; 40 mg methylPREDNISolone acetate 40 MG/ML Medications (Left): 5 mL lidocaine 1 %; 4 mL bupivacaine 0.25 %; 40 mg methylPREDNISolone acetate 40 MG/ML Outcome: tolerated well, no immediate  complications Procedure, treatment alternatives, risks and benefits explained, specific risks discussed. Consent was given by the patient. Immediately prior to procedure a time out was called to verify the correct patient, procedure, equipment, support staff and site/side marked as required. Patient was prepped and draped in the usual sterile fashion.       Clinical Data: No additional findings.  Objective: Vital Signs: There were no vitals taken for this visit.  Physical Exam:  Constitutional: Patient appears well-developed HEENT:  Head: Normocephalic Eyes:EOM are normal Neck: Normal range of motion Cardiovascular: Normal rate Pulmonary/chest: Effort normal Neurologic: Patient is alert Skin: Skin is warm Psychiatric: Patient has normal mood and affect  Ortho Exam: Ortho exam demonstrates moderate effusion in each knee.  Collateral crucial ligaments are stable.  About a 5 to 7 degree flexion contracture is present.  This is in each knee.  Bends easily past 90 to about 110.  Collateral and cruciate ligaments intact.  No groin pain with internal/external rotation of either leg.  Specialty Comments:  MRI LUMBAR SPINE WITHOUT CONTRAST   TECHNIQUE: Multiplanar, multisequence MR imaging of the lumbar spine was performed. No intravenous contrast was administered.   COMPARISON:  12/09/2017.  MRI of the lumbar spine 02/26/2013   FINDINGS: Segmentation: 5 non rib-bearing lumbar type vertebral bodies are present.   Alignment: Grade 1  anterolisthesis is present at L4-5. AP alignment is otherwise anatomic.   Vertebrae: Concave end plates are similar the prior studies. Chronic endplate marrow changes are worse left than right at T12-L1. Fatty infiltration into the sacrum and iliac bones is noted.   Conus medullaris and cauda equina: Conus extends to the L1 level. Conus and cauda equina appear normal.   Paraspinal and other soft tissues: Limited imaging the abdomen  is unremarkable. There is no significant adenopathy.   Disc levels:   The disc levels at L1-2 and L2-3 are unremarkable.   L3-4: A broad-based disc protrusion is present. Moderate facet hypertrophy is slightly worse. There is progressive epidural fat with crowding of the nerve roots in the thecal sac. Mild foraminal narrowing has progressed bilaterally.   L4-5: A broad-based disc protrusion is evident. Advanced facet hypertrophy is again noted. Prominent epidural fat is present. The combination compresses the nerve roots. Mild foraminal narrowing demonstrate some progression bilaterally.   L5-S1: A broad-based disc protrusion is present. Prominent epidural fat is noted. Mild facet hypertrophy has progressed.   IMPRESSION: 1. 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 2. Progressive mild foraminal narrowing bilaterally at L3-4 and L4-5. 3. The foramina at L5-S1 are patent. 4. Grade 1 anterolisthesis at L4-5.     Electronically Signed   By: Marin Roberts M.D.   On: 12/21/2017 15:10  Imaging: No results found.   PMFS History: Patient Active Problem List   Diagnosis Date Noted   Cubital tunnel syndrome on left 08/26/2017    Class: Chronic   Atrial fibrillation (HCC) 04/18/2014   Paresthesia 02/16/2014   Neck pain 02/16/2014   Leukopenia 02/16/2013   Past Medical History:  Diagnosis Date   Arrhythmia    Atrial fibrillation (HCC)    Cataract    OD   Diverticulosis    Fatty liver 06/20/13   Hyperplastic colon polyp    Hypertension    Internal hemorrhoids    Leukopenia    Neck pain    Prostate enlargement    Spinal stenosis     Family History  Problem Relation Age of Onset   Hypertension Mother    Cancer Father    Stroke Maternal Grandfather    Cancer Paternal Grandmother     Past Surgical History:  Procedure Laterality Date    CARDIOVERSION  03/2014   CATARACT EXTRACTION Left 05/14/2019   Dr. Fabian Sharp   EYE SURGERY     Flexible cystoscopy and repair of torn tunica albuginea     ULNAR NERVE TRANSPOSITION Left 08/26/2017   Procedure: LEFT ELBOW ULNAR NEUROLYSIS;  Surgeon: Kerrin Champagne, MD;  Location: [facility];  Service: Orthopedics;  Laterality: Left;   Social History   Occupational History    Employer: [employer]  Tobacco Use   Smoking status: Every Day    Current packs/day: 0.75    Average packs/day: 0.8 packs/day for 40.0 years (30.0 ttl pk-yrs)    Types: Cigarettes   Smokeless tobacco: Never  Vaping Use   Vaping status: Never Used  Substance and Sexual Activity   Alcohol use: Yes    Comment: social   Drug use: No   Sexual activity: Not on file

## 2023-08-24 MED ORDER — METHYLPREDNISOLONE ACETATE 40 MG/ML IJ SUSP
40.0000 mg | INTRAMUSCULAR | Status: AC | PRN
Start: 2023-08-20 — End: 2023-08-20
  Administered 2023-08-20: 40 mg via INTRA_ARTICULAR

## 2023-08-24 MED ORDER — BUPIVACAINE HCL 0.25 % IJ SOLN
4.0000 mL | INTRAMUSCULAR | Status: AC | PRN
Start: 2023-08-20 — End: 2023-08-20
  Administered 2023-08-20: 4 mL via INTRA_ARTICULAR

## 2023-08-24 MED ORDER — LIDOCAINE HCL 1 % IJ SOLN
5.0000 mL | INTRAMUSCULAR | Status: AC | PRN
Start: 2023-08-20 — End: 2023-08-20
  Administered 2023-08-20: 5 mL

## 2024-03-31 ENCOUNTER — Other Ambulatory Visit (INDEPENDENT_AMBULATORY_CARE_PROVIDER_SITE_OTHER): Payer: Self-pay

## 2024-03-31 ENCOUNTER — Ambulatory Visit: Admitting: Orthopedic Surgery

## 2024-03-31 ENCOUNTER — Other Ambulatory Visit: Payer: Self-pay | Admitting: Orthopedic Surgery

## 2024-03-31 DIAGNOSIS — M545 Low back pain, unspecified: Secondary | ICD-10-CM | POA: Diagnosis not present

## 2024-03-31 DIAGNOSIS — M1711 Unilateral primary osteoarthritis, right knee: Secondary | ICD-10-CM

## 2024-03-31 DIAGNOSIS — G8929 Other chronic pain: Secondary | ICD-10-CM | POA: Diagnosis not present

## 2024-03-31 DIAGNOSIS — M25561 Pain in right knee: Secondary | ICD-10-CM

## 2024-03-31 NOTE — Progress Notes (Signed)
 Orthopedic Surgery Progress Note   Assessment: Patient is a 63 y.o. male with right knee pain, suspect osteoarthritis   Plan: -Patient had an effusion and has degenerative changes on his x-ray, so discussed a aspiration and steroid injection into the knee as a treatment option.  After this discussion, patient wanted to proceed.  See procedure note below -Have low suspicion for injection based on his exam, but will send for culture results as well. He may have gout or pseudogout. If his aspirate does show something, he wanted to be updated via MyChart -Patient should return to the office on an as-needed basis  Right knee aspiration/injection note: After discussing the risk, benefits, alternatives of right knee aspiration and injection, patient elected to proceed.  Patient was in a seated position with his knee at 90 degrees.  The anterior lateral soft spot over the right knee was prepped with an alcohol-based prep.  Ethyl chloride was used to anesthetize the skin.  A 20-gauge needle was placed into the intra-articular space via the anterior lateral soft spot.  About 10 cc of serous fluid with red tinge was aspirated from the knee.  No further fluid was able to be aspirated.  The syringe was then removed from the needle.  A new syringe with 1 cc of lidocaine , 1 cc of bupivacaine , 1 cc of Depo-Medrol  was injected into the intra-articular space.  The needle was withdrawn and Band-Aid was applied.  Patient tolerated the procedure well.  The aspirate results will be sent to the lab for cell count, crystal, Gram stain, culture.  ___________________________________________________________________________  History: Patient has a history of chronic knee pain but it has been manageable.  He has gotten a prior steroid injection but it has been several months.  He says his knee acutely worsened over the weekend.  He said at 1 point he tried to stand up and his knee gave out on him.  He notices pain on both the  medial and lateral aspect the knee.  Is worse with weightbearing improves with rest.  There is no trauma or injury that preceded the onset of this worsening pain.  Ambulating without assistive devices.   Physical Exam:  General: no acute distress, appears stated age Neurologic: alert, answering questions appropriately, following commands Respiratory: unlabored breathing on room air, symmetric chest rise Psychiatric: appropriate affect, normal cadence to speech  MSK:   -Right lower extremity  Knee range of motion from 0-90 without any increased pain, did have pain once past 90 degrees but could range to 120  Palpable effusion in the knee, knee stable to varus and valgus stress, negative Lachman, negative posterior drawer, negative McMurray Fires hip flexors, quadriceps, hamstrings, tibialis anterior, gastrocnemius and soleus, extensor hallucis longus Plantarflexes and dorsiflexes toes Sensation intact to light touch in sural, saphenous, tibial, deep peroneal, and superficial peroneal nerve distributions Foot warm and well perfused  Imaging:  XRs of the right knee from 03/31/24 were independently reviewed interpreted, showing joint space narrowing and subchondral sclerosis in the medial compartment.  Osteophyte formation is noted in the patellofemoral compartment.  No fracture or dislocation seen.   Patient name: Gene Anderson Patient MRN: [id number] Date: 03/31/24

## 2024-04-06 LAB — ANAEROBIC AND AEROBIC CULTURE
AER RESULT:: NO GROWTH
MICRO NUMBER:: 16652361
MICRO NUMBER:: 16652362
SPECIMEN QUALITY:: ADEQUATE
SPECIMEN QUALITY:: ADEQUATE

## 2024-04-06 LAB — SYNOVIAL FLUID ANALYSIS, COMPLETE
Basophils, %: 0 %
Eosinophils-Synovial: 0 % (ref 0–2)
Lymphocytes-Synovial Fld: 53 % (ref 0–74)
Monocyte/Macrophage: 7 % (ref 0–69)
Neutrophil, Synovial: 38 % — ABNORMAL HIGH (ref 0–24)
Synoviocytes, %: 2 % (ref 0–15)
WBC, Synovial: 435 {cells}/uL — ABNORMAL HIGH (ref <150)

## 2024-04-08 ENCOUNTER — Telehealth: Payer: Self-pay | Admitting: Physical Medicine and Rehabilitation

## 2024-04-08 ENCOUNTER — Other Ambulatory Visit: Payer: Self-pay | Admitting: Physical Medicine and Rehabilitation

## 2024-04-08 DIAGNOSIS — M5416 Radiculopathy, lumbar region: Secondary | ICD-10-CM

## 2024-04-08 NOTE — Telephone Encounter (Signed)
 Pt called requesting a call to set an appt for injection in back. Pt last injection 05/2023. Please call pt at [phone].

## 2024-04-12 ENCOUNTER — Encounter: Payer: Self-pay | Admitting: Orthopedic Surgery

## 2024-04-16 ENCOUNTER — Ambulatory Visit

## 2024-04-16 VITALS — BP 132/86 | HR 82 | Ht 73.0 in | Wt 219.0 lb

## 2024-04-16 DIAGNOSIS — I4891 Unspecified atrial fibrillation: Secondary | ICD-10-CM | POA: Insufficient documentation

## 2024-04-16 DIAGNOSIS — I10 Essential (primary) hypertension: Secondary | ICD-10-CM | POA: Diagnosis present

## 2024-04-16 DIAGNOSIS — Z87891 Personal history of nicotine dependence: Secondary | ICD-10-CM | POA: Diagnosis present

## 2024-04-16 HISTORY — DX: Personal history of nicotine dependence: Z87.891

## 2024-04-16 NOTE — Patient Instructions (Signed)
 Medication Instructions:  Your physician recommends that you continue on your current medications as directed. Please refer to the Current Medication list given to you today.  *If you need a refill on your cardiac medications before your next appointment, please call your pharmacy*  Lab Work: None If you have labs (blood work) drawn today and your tests are completely normal, you will receive your results only by: MyChart Message (if you have MyChart) OR A paper copy in the mail If you have any lab test that is abnormal or we need to change your treatment, we will call you to review the results.  Testing/Procedures: A zio monitor was ordered today. It will remain on for 14 days. You will then return monitor and event diary in provided box. It takes 1-2 weeks for report to be downloaded and returned to us . We will call you with the results. If monitor falls off or has orange flashing light, please call Zio for further instructions.   Follow-Up: At [facility], you and your health needs are our priority.  As part of our continuing mission to provide you with exceptional heart care, our providers are all part of one team.  This team includes your primary Cardiologist (physician) and Advanced Practice Providers or APPs (Physician Assistants and Nurse Practitioners) who all work together to provide you with the care you need, when you need it.  Your next appointment:   3 month(s)  Provider:   Alean Kobus, MD    We recommend signing up for the patient portal called MyChart.  Sign up information is provided on this After Visit Summary.  MyChart is used to connect with patients for Virtual Visits (Telemedicine).  Patients are able to view lab/test results, encounter notes, upcoming appointments, etc.  Non-urgent messages can be sent to your provider as well.   To learn more about what you can do with MyChart, go to [email or web address].   Other Instructions None

## 2024-04-16 NOTE — Assessment & Plan Note (Signed)
 Remains in regular rhythm. No episodes of A-fib that he he has been symptomatic with. Advised him to continue monitoring at home with his Apple smart watch.  Will assess with a Zio patch for 14 days for any breakthrough episodes of asymptomatic A-fib.  CHA2DS2-VASc score remains low at 1 [hypertension]. Since no breakthrough episodes and has not been on anticoagulant for long-term, I reviewed once again potential stroke risk being low. Hence for now we will hold off on anticoagulation.  If he does have any significant burden of atrial fibrillation on Zio patch monitor or on home monitoring with smart watch we can discuss anticoagulation.  Advised about potential risk factors for recurrence of A-fib including overweight, stress, alcohol, untreated obstructive sleep apnea.  In this context recommended to strongly follow-up with his PCP with regards to OSA management and retitration for CPAP if necessary.  Also strongly encouraged to quit smoking.

## 2024-04-16 NOTE — Progress Notes (Signed)
 Cardiology Consultation:    Date:  04/16/2024   ID:  Gene Anderson, DOB 1961/06/26, MRN [id number]  PCP:  Gene Atlas, MD  Cardiologist:  Gene SAUNDERS Kennia Vanvorst, MD   Referring MD: Gene Atlas, MD   No chief complaint on file.    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  Problem List Items Addressed This Visit     Atrial fibrillation (HCC) - Primary   Remains in regular rhythm. No episodes of A-fib that he he has been symptomatic with. Advised him to continue monitoring at home with his Apple smart watch.  Will assess with a Zio patch for 14 days for any breakthrough episodes of asymptomatic A-fib.  CHA2DS2-VASc score remains low at 1 [hypertension]. Since no breakthrough episodes and has not been on anticoagulant for long-term, I reviewed once again potential stroke risk being low. Hence for now we will hold off on anticoagulation.  If he does have any significant burden of atrial fibrillation on Zio patch monitor or on home monitoring with smart watch we can discuss anticoagulation.  Advised about potential risk factors for recurrence of A-fib including overweight, stress, alcohol, untreated obstructive sleep apnea.  In this context recommended to strongly follow-up with his PCP with regards to OSA management and retitration for CPAP if necessary.  Also strongly encouraged to quit smoking.       Relevant Orders   EKG 12-Lead (Completed)   LONG TERM MONITOR XT (3-14 DAYS)   Hypertension   Well-controlled. Continue amlodipine 10 mg once daily.      Smoking  history   Advised her about harmful effects and strongly recommended to quit smoking.  He mentions he is aware and he is working on cutting down and plans to quit.      To clinic tentatively in 3 months to follow-up on Zio patch results    History of Present Illness:    Gene Anderson is a 63 y.o. male who is being seen today for the evaluation of paroxysmal atrial fibrillation at the request of Gene Atlas, MD. Previously had evaluation with Gene Anderson in 2021  Pleasant man here for the visit by himself.  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  Lives at home with his wife.  Work is physically demanding.  Does not exercise routinely.  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  Mentions overall he has been doing well no significant symptoms or limitations with his activities.  Mentions he has come back to reestablish care to ensure his cardiac health is good. Denies any chest pain, shortness of breath, orthopnea, paroxysmal nocturnal dyspnea. No palpitations.  Mentions he does have an Apple smart watch but does does not routinely use it.  Denies any blood in urine or stools.  Continues to smoke cigarettes up to  a pack a day. Alcohol consumption occasionally on social events. No other substance abuse.  Prior echocardiogram transthoracic January 2021 noted normal biventricular function with moderately dilated left atrium, trace Gene, mild TR, trace PI, aortic root measuring 3.8 cm.  Stress test with nuclear imaging January 2021 showed no evidence suggestive of ischemia.  Heart monitor November 2021 7-day study noted no  significant arrhythmias and predominantly noted sinus rhythm average heart rate 97/min and the rare ventricular and supraventricular ectopy burden less than 1%.   EKG in the clinic today shows sinus rhythm heart rate 82/min, PR interval 138 ms, QRS duration 82 ms, left axis deviation.  Nonspecific T wave inversions in lead III.  In comparison to prior EKG from 06/28/2021 no significant change.  Past Medical History:  Diagnosis Date   Arrhythmia    Atrial fibrillation (HCC)    Cataract    OD   Diverticulosis    Fatty liver 06/20/13   Hyperplastic colon polyp    Hypertension    Internal hemorrhoids    Leukopenia    Neck pain    Prostate enlargement    Spinal stenosis     Past Surgical History:  Procedure Laterality Date   CARDIOVERSION  03/2014   CATARACT EXTRACTION Left 05/14/2019   Dr. Glendia Anderson   EYE SURGERY     Flexible cystoscopy and repair of torn tunica albuginea     ULNAR NERVE TRANSPOSITION Left 08/26/2017   Procedure: LEFT ELBOW ULNAR NEUROLYSIS;  Surgeon: Gene Lynwood BRAVO, MD;  Location: [facility];  Service: Orthopedics;  Laterality: Left;    Current Medications: Current Meds  Medication Sig   amLODipine (NORVASC) 10 MG tablet Take 10 mg by mouth daily.   celecoxib  (CELEBREX ) 200 MG capsule Take by mouth 2 (two) times daily.   diclofenac  sodium (VOLTAREN ) 1 % GEL APPLY 2 GRAMS EXTERNALLY TO THE AFFECTED AREA FOUR TIMES DAILY AS NEEDED FOR PAIN   naphazoline-pheniramine (NAPHCON-A) 0.025-0.3 % ophthalmic solution Place 1 drop into both eyes 2 (two) times daily as needed for irritation or allergies (USES IN THE MORNING).    Oxycodone  HCl 20 MG TABS Take 20 m by mouth every 6 (six) hours.   pregabalin (LYRICA) 25 MG capsule TAKE ONE CAPSULE BY MOUTH THREE TIMES DAILY if tolerated. NO NEURONTIN  NO GABAPENTIN ]   rosuvastatin  (CRESTOR ) 20 MG tablet Take 1 tablet (20 mg total) by mouth daily.   terbinafine  (LAMISIL ) 250 MG tablet Take 1 tablet (250 mg total)  by mouth daily.   TYRVAYA 0.03 MG/ACT SOLN Place 1 spray into both nostrils 2 (two) times daily.   Current Facility-Administered Medications for the 04/16/24 encounter (Office Visit) with Gene Anderson, Gene SAUNDERS, MD  Medication   eszopiclone  (LUNESTA ) tablet 2 mg     Allergies:   Augmentin  [amoxicillin -pot clavulanate]   Social History   Socioeconomic History   Marital status: Married    Spouse name: Not on file   Number of children: 4   Years of education: 14   Highest education level: Not on file  Occupational History    Employer: [employer]  Tobacco Use   Smoking status: Every Day    Current packs/day: 0.75    Average packs/day: 0.8 packs/day for 40.0 years (30.0 ttl pk-yrs)    Types: Cigarettes   Smokeless tobacco: Never  Vaping Use   Vaping status: Never Used  Substance and Sexual Activity   Alcohol use: Yes    Comment: social   Drug use: No  Sexual activity: Not on file  Other Topics Concern   Not on file  Social History Narrative   Patient lives at home with his girl friend.   Patient is not working at this time.   Right handed.   Education college education.   Caffeine None   Social Drivers of Corporate investment banker Strain: Not on file  Food Insecurity: Not on file  Transportation Needs: Not on file  Physical Activity: Not on file  Stress: Not on file  Social Connections: Unknown (02/08/2022)   Received from [facility]   Social Network    Social Network: Not on file     Family History: The patient's family history includes Cancer in his father and paternal grandmother; Hypertension in his mother; Stroke in his maternal grandfather. ROS:   Please see the history of present illness.    All 14 point review of systems negative except as described per history of present illness.  EKGs/Labs/Other Studies Reviewed:    The following studies were reviewed today:   EKG:  EKG Interpretation Date/Time:  Friday April 16 2024 14:28:31  EDT Ventricular Rate:  82 PR Interval:  138 QRS Duration:  82 QT Interval:  386 QTC Calculation: 450 R Axis:   -35  Text Interpretation: Normal sinus rhythm Left axis deviation When compared with ECG of 28-Jun-2021 10:20, No significant change was found Confirmed by Liborio Hai reddy [phone]) on 04/16/2024 2:50:03 PM    Recent Labs: No results found for requested labs within last 365 days.  Recent Lipid Panel    Component Value Date/Time   CHOL 218 (H) 08/06/2022 1232   TRIG 80 08/06/2022 1232   HDL 69 08/06/2022 1232   CHOLHDL 3.2 08/06/2022 1232   LDLCALC 132 (H) 08/06/2022 1232    Physical Exam:    VS:  BP 132/86 (BP Location: Right Arm)   Pulse 82   Ht 6' 1 (1.854 m)   Wt 219 lb (99.3 kg)   SpO2 96%   BMI 28.89 kg/m     Wt Readings from Last 3 Encounters:  04/16/24 219 lb (99.3 kg)  01/16/23 224 lb 3.2 oz (101.7 kg)  04/22/22 219 lb (99.3 kg)     GENERAL:  Well nourished, well developed in no acute distress NECK: No JVD; No carotid bruits CARDIAC: RRR, S1 and S2 present, no murmurs, no rubs, no gallops CHEST:  Clear to auscultation without rales, wheezing or rhonchi  Extremities: No pitting pedal edema. Pulses bilaterally symmetric with radial 2+ and dorsalis pedis 2+ NEUROLOGIC:  Alert and oriented x 3  Medication Adjustments/Labs and Tests Ordered: Current medicines are reviewed at length with the patient today.  Concerns regarding medicines are outlined above.  Orders Placed This Encounter  Procedures   LONG TERM MONITOR XT (3-14 DAYS)   EKG 12-Lead   No orders of the defined types were placed in this encounter.   Signed, Hai jess Liborio, MD, MPH, [facility]. 04/16/2024 3:14 PM    [facility]

## 2024-04-16 NOTE — Assessment & Plan Note (Signed)
Well-controlled.  Continue amlodipine 10 mg once daily. 

## 2024-04-16 NOTE — Assessment & Plan Note (Signed)
 Advised her about harmful effects and strongly recommended to quit smoking.  He mentions he is aware and he is working on cutting down and plans to quit.

## 2024-04-22 ENCOUNTER — Ambulatory Visit: Admitting: Orthopedic Surgery

## 2024-04-26 ENCOUNTER — Telehealth: Payer: Self-pay

## 2024-04-26 NOTE — Telephone Encounter (Signed)
 Called patient, LVM, to see if he could come for his injection on 7/31 at 2 pm

## 2024-05-05 ENCOUNTER — Ambulatory Visit: Admitting: Physical Medicine and Rehabilitation

## 2024-05-05 ENCOUNTER — Other Ambulatory Visit: Payer: Self-pay

## 2024-05-05 VITALS — BP 142/88 | HR 72

## 2024-05-05 DIAGNOSIS — M5416 Radiculopathy, lumbar region: Secondary | ICD-10-CM

## 2024-05-05 MED ORDER — METHYLPREDNISOLONE ACETATE 40 MG/ML IJ SUSP
40.0000 mg | Freq: Once | INTRAMUSCULAR | Status: AC
  Administered 2024-05-05: 40 mg

## 2024-05-05 NOTE — Patient Instructions (Signed)

## 2024-05-05 NOTE — Progress Notes (Signed)
 Pain Scale   Average Pain 7 Patient advising he has lower back pain radiating to both legs. Patient advised when he uses Heat it helps with pain management        +Driver, -BT, -Dye Allergies.

## 2024-05-16 NOTE — Progress Notes (Signed)
 DAVIER TRAMELL - 63 y.o. male MRN [id number]  Date of birth: 1960/10/14  Office Visit Note: Visit Date: 05/05/2024 PCP: Shelda Atlas, MD Referred by: Shelda Atlas, MD  Subjective: Chief Complaint  Patient presents with   Lower Back - Pain   HPI:  BERK PILOT is a 63 y.o. male who comes in today for planned repeat Left L4-5  Lumbar Transforaminal epidural steroid injection with fluoroscopic guidance.  The patient has failed conservative care including home exercise, medications, time and activity modification.  This injection will be diagnostic and hopefully therapeutic.  Please see requesting physician notes for further details and justification. Patient received more than 50% pain relief from prior injection.   Referring: Dr. Ozell Ada   ROS Otherwise per HPI.  Assessment & Plan: Visit Diagnoses:    ICD-10-CM   1. Lumbar radiculopathy  M54.16 XR C-ARM NO REPORT    Epidural Steroid injection    methylPREDNISolone  acetate (DEPO-MEDROL ) injection 40 mg      Plan: No additional findings.   Meds & Orders:  Meds ordered this encounter  Medications   methylPREDNISolone  acetate (DEPO-MEDROL ) injection 40 mg    Orders Placed This Encounter  Procedures   XR C-ARM NO REPORT   Epidural Steroid injection    Follow-up: No follow-ups on file.   Procedures: No procedures performed  Lumbosacral Transforaminal Epidural Steroid Injection - Sub-Pedicular Approach with Fluoroscopic Guidance  Patient: Gene Anderson      Date of Birth: [birth year] MRN: [id number] PCP: Shelda Atlas, MD      Visit Date: 05/05/2024   Universal Protocol:    Date/Time: 05/05/2024  Consent Given By: the patient  Position: PRONE  Additional Comments: Vital signs were monitored before and after the procedure. Patient was prepped and draped in the usual sterile fashion. The correct patient, procedure, and site was verified.   Injection Procedure Details:   Procedure diagnoses:  Lumbar radiculopathy [M54.16]    Meds Administered:  Meds ordered this encounter  Medications   methylPREDNISolone  acetate (DEPO-MEDROL ) injection 40 mg    Laterality: Left  Location/Site: L4  Needle:5.0 in., 22 ga.  Short bevel or Quincke spinal needle  Needle Placement: Transforaminal  Findings:    -Comments: Excellent flow of contrast along the nerve, nerve root and into the epidural space.  Procedure Details: After squaring off the end-plates to get a true AP view, the C-arm was positioned so that an oblique view of the foramen as noted above was visualized. The target area is just inferior to the nose of the scotty dog or sub pedicular. The soft tissues overlying this structure were infiltrated with 2-3 ml. of 1% Lidocaine  without Epinephrine.  The spinal needle was inserted toward the target using a trajectory view along the fluoroscope beam.  Under AP and lateral visualization, the needle was advanced so it did not puncture dura and was located close the 6 O'Clock position of the pedical in AP tracterory. Biplanar projections were used to confirm position. Aspiration was confirmed to be negative for CSF and/or blood. A 1-2 ml. volume of Isovue -250 was injected and flow of contrast was noted at each level. Radiographs were obtained for documentation purposes.   After attaining the desired flow of contrast documented above, a 0.5 to 1.0 ml test dose of 0.25% Marcaine  was injected into each respective transforaminal space.  The patient was observed for 90 seconds post injection.  After no sensory deficits were reported, and normal lower extremity motor function was noted,  the above injectate was administered so that equal amounts of the injectate were placed at each foramen (level) into the transforaminal epidural space.   Additional Comments:  The patient tolerated the procedure well Dressing: 2 x 2 sterile gauze and Band-Aid    Post-procedure details: Patient was  observed during the procedure. Post-procedure instructions were reviewed.  Patient left the clinic in stable condition.    Clinical History: MRI LUMBAR SPINE WITHOUT CONTRAST   TECHNIQUE: Multiplanar, multisequence MR imaging of the lumbar spine was performed. No intravenous contrast was administered.   COMPARISON:  12/09/2017.  MRI of the lumbar spine 02/26/2013   FINDINGS: Segmentation: 5 non rib-bearing lumbar type vertebral bodies are present.   Alignment: Grade 1 anterolisthesis is present at L4-5. AP alignment is otherwise anatomic.   Vertebrae: Concave end plates are similar the prior studies. Chronic endplate marrow changes are worse left than right at T12-L1. Fatty infiltration into the sacrum and iliac bones is noted.   Conus medullaris and cauda equina: Conus extends to the L1 level. Conus and cauda equina appear normal.   Paraspinal and other soft tissues: Limited imaging the abdomen is unremarkable. There is no significant adenopathy.   Disc levels:   The disc levels at L1-2 and L2-3 are unremarkable.   L3-4: A broad-based disc protrusion is present. Moderate facet hypertrophy is slightly worse. There is progressive epidural fat with crowding of the nerve roots in the thecal sac. Mild foraminal narrowing has progressed bilaterally.   L4-5: A broad-based disc protrusion is evident. Advanced facet hypertrophy is again noted. Prominent epidural fat is present. The combination compresses the nerve roots. Mild foraminal narrowing demonstrate some progression bilaterally.   L5-S1: A broad-based disc protrusion is present. Prominent epidural fat is noted. Mild facet hypertrophy has progressed.   IMPRESSION: 1. 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 2. Progressive mild foraminal narrowing bilaterally at L3-4 and L4-5. 3. The foramina at  L5-S1 are patent. 4. Grade 1 anterolisthesis at L4-5.     Electronically Signed   By: Lonni Necessary M.D.   On: 12/21/2017 15:10     Objective:  VS:  HT:    WT:   BMI:     BP:(!) 142/88  HR:72bpm  TEMP: ( )  RESP:  Physical Exam Vitals and nursing note reviewed.  Constitutional:      General: He is not in acute distress.    Appearance: Normal appearance. He is not ill-appearing.  HENT:     Head: Normocephalic and atraumatic.     Right Ear: External ear normal.     Left Ear: External ear normal.     Nose: No congestion.  Eyes:     Extraocular Movements: Extraocular movements intact.  Cardiovascular:     Rate and Rhythm: Normal rate.     Pulses: Normal pulses.  Pulmonary:     Effort: Pulmonary effort is normal. No respiratory distress.  Abdominal:     General: There is no distension.     Palpations: Abdomen is soft.  Musculoskeletal:        General: No tenderness or signs of injury.     Cervical back: Neck supple.     Right lower leg: No edema.     Left lower leg: No edema.     Comments: Patient has good distal strength without clonus.  Skin:    Findings: No erythema or rash.  Neurological:  General: No focal deficit present.     Mental Status: He is alert and oriented to person, place, and time.     Sensory: No sensory deficit.     Motor: No weakness or abnormal muscle tone.     Coordination: Coordination normal.  Psychiatric:        Mood and Affect: Mood normal.        Behavior: Behavior normal.      Imaging: No results found.

## 2024-05-16 NOTE — Procedures (Signed)
 Lumbosacral Transforaminal Epidural Steroid Injection - Sub-Pedicular Approach with Fluoroscopic Guidance  Patient: Gene Anderson      Date of Birth: 1961/07/15 MRN: [id number] PCP: Shelda Atlas, MD      Visit Date: 05/05/2024   Universal Protocol:    Date/Time: 05/05/2024  Consent Given By: the patient  Position: PRONE  Additional Comments: Vital signs were monitored before and after the procedure. Patient was prepped and draped in the usual sterile fashion. The correct patient, procedure, and site was verified.   Injection Procedure Details:   Procedure diagnoses: Lumbar radiculopathy [M54.16]    Meds Administered:  Meds ordered this encounter  Medications   methylPREDNISolone  acetate (DEPO-MEDROL ) injection 40 mg    Laterality: Left  Location/Site: L4  Needle:5.0 in., 22 ga.  Short bevel or Quincke spinal needle  Needle Placement: Transforaminal  Findings:    -Comments: Excellent flow of contrast along the nerve, nerve root and into the epidural space.  Procedure Details: After squaring off the end-plates to get a true AP view, the C-arm was positioned so that an oblique view of the foramen as noted above was visualized. The target area is just inferior to the nose of the scotty dog or sub pedicular. The soft tissues overlying this structure were infiltrated with 2-3 ml. of 1% Lidocaine  without Epinephrine.  The spinal needle was inserted toward the target using a trajectory view along the fluoroscope beam.  Under AP and lateral visualization, the needle was advanced so it did not puncture dura and was located close the 6 O'Clock position of the pedical in AP tracterory. Biplanar projections were used to confirm position. Aspiration was confirmed to be negative for CSF and/or blood. A 1-2 ml. volume of Isovue -250 was injected and flow of contrast was noted at each level. Radiographs were obtained for documentation purposes.   After attaining the desired flow  of contrast documented above, a 0.5 to 1.0 ml test dose of 0.25% Marcaine  was injected into each respective transforaminal space.  The patient was observed for 90 seconds post injection.  After no sensory deficits were reported, and normal lower extremity motor function was noted,   the above injectate was administered so that equal amounts of the injectate were placed at each foramen (level) into the transforaminal epidural space.   Additional Comments:  The patient tolerated the procedure well Dressing: 2 x 2 sterile gauze and Band-Aid    Post-procedure details: Patient was observed during the procedure. Post-procedure instructions were reviewed.  Patient left the clinic in stable condition.

## 2024-07-15 ENCOUNTER — Other Ambulatory Visit: Payer: Self-pay

## 2024-07-15 DIAGNOSIS — G9332 Myalgic encephalomyelitis/chronic fatigue syndrome: Secondary | ICD-10-CM | POA: Insufficient documentation

## 2024-07-15 DIAGNOSIS — N400 Enlarged prostate without lower urinary tract symptoms: Secondary | ICD-10-CM | POA: Insufficient documentation

## 2024-07-15 DIAGNOSIS — M4800 Spinal stenosis, site unspecified: Secondary | ICD-10-CM | POA: Insufficient documentation

## 2024-07-15 DIAGNOSIS — K5790 Diverticulosis of intestine, part unspecified, without perforation or abscess without bleeding: Secondary | ICD-10-CM | POA: Insufficient documentation

## 2024-07-15 DIAGNOSIS — M47817 Spondylosis without myelopathy or radiculopathy, lumbosacral region: Secondary | ICD-10-CM

## 2024-07-15 DIAGNOSIS — M51369 Other intervertebral disc degeneration, lumbar region without mention of lumbar back pain or lower extremity pain: Secondary | ICD-10-CM | POA: Insufficient documentation

## 2024-07-15 DIAGNOSIS — I499 Cardiac arrhythmia, unspecified: Secondary | ICD-10-CM | POA: Insufficient documentation

## 2024-07-15 DIAGNOSIS — G5770 Causalgia of unspecified lower limb: Secondary | ICD-10-CM

## 2024-07-15 DIAGNOSIS — M5430 Sciatica, unspecified side: Secondary | ICD-10-CM | POA: Insufficient documentation

## 2024-07-15 DIAGNOSIS — F1120 Opioid dependence, uncomplicated: Secondary | ICD-10-CM | POA: Insufficient documentation

## 2024-07-15 DIAGNOSIS — G5620 Lesion of ulnar nerve, unspecified upper limb: Secondary | ICD-10-CM | POA: Insufficient documentation

## 2024-07-15 DIAGNOSIS — G894 Chronic pain syndrome: Secondary | ICD-10-CM | POA: Insufficient documentation

## 2024-07-15 DIAGNOSIS — K635 Polyp of colon: Secondary | ICD-10-CM | POA: Insufficient documentation

## 2024-07-15 DIAGNOSIS — H269 Unspecified cataract: Secondary | ICD-10-CM | POA: Insufficient documentation

## 2024-07-15 DIAGNOSIS — M25569 Pain in unspecified knee: Secondary | ICD-10-CM

## 2024-07-15 DIAGNOSIS — K648 Other hemorrhoids: Secondary | ICD-10-CM | POA: Insufficient documentation

## 2024-07-15 HISTORY — DX: Spondylosis without myelopathy or radiculopathy, lumbosacral region: M47.817

## 2024-07-15 HISTORY — DX: Sciatica, unspecified side: M54.30

## 2024-07-15 HISTORY — DX: Lesion of ulnar nerve, unspecified upper limb: G56.20

## 2024-07-15 HISTORY — DX: Causalgia of unspecified lower limb: G57.70

## 2024-07-15 HISTORY — DX: Myalgic encephalomyelitis/chronic fatigue syndrome: G93.32

## 2024-07-15 HISTORY — DX: Pain in unspecified knee: M25.569

## 2024-07-15 HISTORY — DX: Opioid dependence, uncomplicated: F11.20

## 2024-07-15 HISTORY — DX: Chronic pain syndrome: G89.4

## 2024-07-15 HISTORY — DX: Other intervertebral disc degeneration, lumbar region without mention of lumbar back pain or lower extremity pain: M51.369

## 2024-07-20 ENCOUNTER — Ambulatory Visit

## 2024-08-02 ENCOUNTER — Encounter: Payer: Self-pay | Admitting: Radiology

## 2024-08-09 ENCOUNTER — Other Ambulatory Visit (HOSPITAL_COMMUNITY): Payer: Self-pay

## 2024-08-11 ENCOUNTER — Ambulatory Visit: Payer: Self-pay

## 2024-08-11 ENCOUNTER — Other Ambulatory Visit (HOSPITAL_COMMUNITY): Payer: Self-pay

## 2024-08-11 DIAGNOSIS — I4891 Unspecified atrial fibrillation: Secondary | ICD-10-CM

## 2024-08-11 DIAGNOSIS — I493 Ventricular premature depolarization: Secondary | ICD-10-CM

## 2024-08-11 MED ORDER — METRONIDAZOLE 500 MG PO TABS
2000.0000 mg | ORAL_TABLET | Freq: Once | ORAL | 0 refills | Status: AC
  Filled 2024-08-11: qty 4, 1d supply, fill #0

## 2024-08-17 NOTE — Telephone Encounter (Signed)
 Echo has been ordered. Results have been routed to PCP.  Patient is aware of results and plan of care that were sent through MyChart by Dr. Liborio.   Alan, RN

## 2024-09-01 ENCOUNTER — Ambulatory Visit: Admitting: Podiatry

## 2024-09-01 ENCOUNTER — Encounter: Payer: Self-pay | Admitting: Podiatry

## 2024-09-01 DIAGNOSIS — M7751 Other enthesopathy of right foot: Secondary | ICD-10-CM

## 2024-09-01 DIAGNOSIS — M2041 Other hammer toe(s) (acquired), right foot: Secondary | ICD-10-CM | POA: Diagnosis not present

## 2024-09-01 MED ORDER — TRIAMCINOLONE ACETONIDE 10 MG/ML IJ SUSP
10.0000 mg | Freq: Once | INTRAMUSCULAR | Status: AC
  Administered 2024-09-01: 10 mg via INTRA_ARTICULAR

## 2024-09-04 NOTE — Progress Notes (Signed)
 Subjective:   Patient ID: Gene Anderson, male   DOB: 63 y.o.   MRN: [id number]   HPI Patient presents with chronic inflammation pain of the forefoot right with fluid occurring of the fourth digit and digital deformities with elongation of the toes and contracture.  Patient does not smoke and tries to be active   Review of Systems  All other systems reviewed and are negative.       Objective:  Physical Exam Vitals and nursing note reviewed.  Constitutional:      Appearance: He is well-developed.  Pulmonary:     Effort: Pulmonary effort is normal.  Musculoskeletal:        General: Normal range of motion.  Skin:    General: Skin is warm.  Neurological:     Mental Status: He is alert.     Neurovascular status intact muscle strength is found to be adequate range of motion adequate with rigid contracture of the lesser digits right over left foot with inflammation of the fourth digit right inner phalangeal joint painful when pressed.  Good digital perfusion well-oriented x 3     Assessment:  Inflammatory capsulitis of the fifth digit fourth digit right fluid buildup with digital deformity consistent with hammertoe     Plan:  H&P education rendered discussed all condition and shoe gear modifications wider shoes and other techniques.  Sterile prep injected the inner phalangeal joint 2 mg dexamethasone  Kenalog  5 mg Xylocaine  to reduce inflammation and reappoint to recheck

## 2024-09-06 ENCOUNTER — Other Ambulatory Visit: Payer: Self-pay

## 2024-09-06 ENCOUNTER — Emergency Department (HOSPITAL_COMMUNITY)

## 2024-09-06 ENCOUNTER — Emergency Department (HOSPITAL_COMMUNITY)
Admission: EM | Admit: 2024-09-06 | Discharge: 2024-09-06 | Disposition: A | Discharge status: Home / Self Care | Attending: Emergency Medicine | Admitting: Emergency Medicine

## 2024-09-06 DIAGNOSIS — R0789 Other chest pain: Secondary | ICD-10-CM

## 2024-09-06 LAB — BASIC METABOLIC PANEL WITH GFR
Anion gap: 10 (ref 5–15)
BUN: 9 mg/dL (ref 8–23)
CO2: 27 mmol/L (ref 22–32)
Calcium: 9.4 mg/dL (ref 8.9–10.3)
Chloride: 103 mmol/L (ref 98–111)
Creatinine, Ser: 0.69 mg/dL (ref 0.61–1.24)
GFR, Estimated: >60 mL/min (ref >60)
Glucose, Bld: 86 mg/dL (ref 70–99)
Potassium: 4.4 mmol/L (ref 3.5–5.1)
Sodium: 140 mmol/L (ref 135–145)

## 2024-09-06 LAB — CBC
HCT: 42.2 % (ref 39.0–52.0)
Hemoglobin: 14.1 g/dL (ref 13.0–17.0)
MCH: 31.6 pg (ref 26.0–34.0)
MCHC: 33.4 g/dL (ref 30.0–36.0)
MCV: 94.6 fL (ref 80.0–100.0)
Platelets: 270 K/uL (ref 150–400)
RBC: 4.46 MIL/uL (ref 4.22–5.81)
RDW: 12.4 % (ref 11.5–15.5)
WBC: 3.4 K/uL — ABNORMAL LOW (ref 4.0–10.5)
nRBC: 0 % (ref 0.0–0.2)

## 2024-09-06 LAB — TROPONIN T, HIGH SENSITIVITY: Troponin T High Sensitivity: <15 ng/L (ref 0–19)

## 2024-09-06 NOTE — ED Provider Notes (Signed)
 [place] EMERGENCY DEPARTMENT AT [facility] Provider Note  CSN: [id number] Arrival date & time: 09/06/24 1407  Chief Complaint(s) Chest Pain  HPI Gene Anderson is a 63 y.o. male history of hypertension, hyperlipidemia presenting to the emergency department with chest pain.  Patient reports chest pain for around a week.  Pain is mainly in the right chest.  Worse with movement and moving the arm.  He thought it could be due to acid reflux and tried Pepcid  without any improvement.  He denies any nausea or vomiting, shortness of breath, diaphoresis, exertional pain, pleuritic pain, leg swelling or pain, fevers or chills, syncope or lightheadedness.  No trauma or injury to the chest.  No back pain.   Past Medical History Past Medical History:  Diagnosis Date   Arrhythmia    Atrial fibrillation (HCC)    Cataract    OD   Chronic fatigue syndrome 07/15/2024   Chronic pain syndrome 07/15/2024   Complex regional pain syndrome type 2 of lower extremity 07/15/2024   Cubital tunnel syndrome on left 08/26/2017   Degeneration of lumbar intervertebral disc 07/15/2024   Diverticulosis    Fatty liver 06/20/2013   Hyperplastic colon polyp    Hypertension    Internal hemorrhoids    Knee pain 07/15/2024   Lesion of ulnar nerve 07/15/2024   Leukopenia    Lumbosacral spondylosis without myelopathy 07/15/2024   Neck pain    Opioid dependence (HCC) 07/15/2024   Paresthesia 02/16/2014   Prostate enlargement    Sciatica 07/15/2024   Smoking history 04/16/2024   Spinal stenosis    Patient Active Problem List   Diagnosis Date Noted   Chronic fatigue syndrome 07/15/2024   Chronic pain syndrome 07/15/2024   Complex regional pain syndrome type 2 of lower extremity 07/15/2024   Degeneration of lumbar intervertebral disc 07/15/2024   Knee pain 07/15/2024   Lesion of ulnar nerve 07/15/2024   Lumbosacral spondylosis without myelopathy 07/15/2024   Opioid dependence (HCC) 07/15/2024    Sciatica 07/15/2024   Spinal stenosis    Prostate enlargement    Internal hemorrhoids    Hyperplastic colon polyp    Diverticulosis    Cataract    Arrhythmia    Hypertension 04/16/2024   Smoking history 04/16/2024   Cubital tunnel syndrome on left 08/26/2017    Class: Chronic   Atrial fibrillation (HCC) 04/18/2014   Paresthesia 02/16/2014   Neck pain 02/16/2014   Fatty liver 06/20/2013   Leukopenia 02/16/2013   Home Medication(s) Prior to Admission medications   Medication Sig Start Date End Date Taking? Authorizing Provider  amLODipine (NORVASC) 10 MG tablet Take 10 mg by mouth daily.    [provider]  celecoxib  (CELEBREX ) 200 MG capsule Take by mouth 2 (two) times daily. 11/07/20   [provider]  diclofenac  sodium (VOLTAREN ) 1 % GEL APPLY 2 GRAMS EXTERNALLY TO THE AFFECTED AREA FOUR TIMES DAILY AS NEEDED FOR PAIN 02/08/19   [name], James E, MD  naphazoline-pheniramine (NAPHCON-A) 0.025-0.3 % ophthalmic solution Place 1 drop into both eyes 2 (two) times daily as needed for irritation or allergies (USES IN THE MORNING).     [provider]  Oxycodone  HCl 20 MG TABS Take 20 m by mouth every 6 (six) hours. 05/25/18   [provider]  pregabalin (LYRICA) 25 MG capsule TAKE ONE CAPSULE BY MOUTH THREE TIMES DAILY if tolerated. NO NEURONTIN  NO GABAPENTIN ] 10/17/20   [provider]  rosuvastatin  (CRESTOR ) 20 MG tablet Take 1 tablet (  20 mg total) by mouth daily. 10/04/19 09/01/24  O'NealDarryle Ned, MD  terbinafine  (LAMISIL ) 250 MG tablet Take 1 tablet (250 mg total) by mouth daily. 03/16/20   Regal, [place] RAMAN, DPM  TYRVAYA 0.03 MG/ACT SOLN Place 1 spray into both nostrils 2 (two) times daily. 03/31/24   [provider]  flecainide  (TAMBOCOR ) 100 MG tablet Take 1 tablet (100 mg total) by mouth daily as needed. for palpitations Patient taking differently: Take 100 mg by mouth daily as needed (palpitations).  04/04/16 07/08/19  Waddell Danelle ORN, MD   gabapentin  (NEURONTIN ) 300 MG capsule Take at night time for 3 days then at night and in the AM for 3 days  And then TID. Patient taking differently: Take 300 mg by mouth 3 (three) times daily.  12/09/17 07/08/19  [name], James E, MD  levocetirizine (XYZAL ) 5 MG tablet Take 1 tablet (5 mg total) by mouth every evening. 10/05/17 07/08/19  Gretta Ozell CROME, PA-C  metoprolol  (LOPRESSOR ) 50 MG tablet Take 1 tablet (50 mg total) by mouth daily as needed. for palpitations Patient taking differently: Take 50 mg by mouth daily as needed (palpitations).  04/04/16 07/08/19  Waddell Danelle ORN, MD                                                                                                                                    Past Surgical History Past Surgical History:  Procedure Laterality Date   CARDIOVERSION  03/2014   CATARACT EXTRACTION Left 05/14/2019   Dr. Glendia Gaudy   EYE SURGERY     Flexible cystoscopy and repair of torn tunica albuginea     ULNAR NERVE TRANSPOSITION Left 08/26/2017   Procedure: LEFT ELBOW ULNAR NEUROLYSIS;  Surgeon: Lucilla Lynwood BRAVO, MD;  Location: [facility];  Service: Orthopedics;  Laterality: Left;   Family History Family History  Problem Relation Age of Onset   Hypertension Mother    Cancer Father    Stroke Maternal Grandfather    Cancer Paternal Grandmother     Social History Social History   Tobacco Use   Smoking status: Every Day    Current packs/day: 0.75    Average packs/day: 0.8 packs/day for 40.0 years (30.0 ttl pk-yrs)    Types: Cigarettes   Smokeless tobacco: Never  Vaping Use   Vaping status: Never Used  Substance Use Topics   Alcohol use: Yes    Comment: social   Drug use: No   Allergies Amoxicillin  and Augmentin  [amoxicillin -pot clavulanate]  Review of Systems Review of Systems  All other systems reviewed and are negative.   Physical Exam Vital Signs  I have reviewed the triage vital signs BP (!) 146/94 (BP Location: Left Arm)    Pulse 77   Temp 98.1 F (36.7 C) (Oral)   Resp 18   SpO2 98%  Physical Exam Vitals and nursing note reviewed.  Constitutional:  General: He is not in acute distress.    Appearance: Normal appearance.  HENT:     Mouth/Throat:     Mouth: Mucous membranes are moist.  Eyes:     Conjunctiva/sclera: Conjunctivae normal.  Cardiovascular:     Rate and Rhythm: Normal rate and regular rhythm.     Pulses:          Radial pulses are 2+ on the right side and 2+ on the left side.  Pulmonary:     Effort: Pulmonary effort is normal. No respiratory distress.     Breath sounds: Normal breath sounds.  Chest:     Chest wall: Tenderness (right upper chest reproduces pain) present.  Abdominal:     General: Abdomen is flat.     Palpations: Abdomen is soft.     Tenderness: There is no abdominal tenderness.  Musculoskeletal:     Right lower leg: No edema.     Left lower leg: No edema.  Skin:    General: Skin is warm and dry.     Capillary Refill: Capillary refill takes less than 2 seconds.  Neurological:     Mental Status: He is alert and oriented to person, place, and time. Mental status is at baseline.  Psychiatric:        Mood and Affect: Mood normal.        Behavior: Behavior normal.     ED Results and Treatments Labs (all labs ordered are listed, but only abnormal results are displayed) Labs Reviewed  CBC - Abnormal; Notable for the following components:      Result Value   WBC 3.4 (*)    All other components within normal limits  BASIC METABOLIC PANEL WITH GFR  TROPONIN T, HIGH SENSITIVITY                                                                                                                          Radiology DG Chest 2 View Result Date: 09/06/2024 CLINICAL DATA:  Chest pain for 1 week EXAM: CHEST - 2 VIEW COMPARISON:  06/28/2021 FINDINGS: Frontal and lateral views of the chest demonstrate an unremarkable cardiac silhouette. No acute airspace disease, effusion, or  pneumothorax. No acute bony abnormalities. IMPRESSION: 1. No acute intrathoracic process. Electronically Signed   By: Ozell Daring M.D.   On: 09/06/2024 15:27    Pertinent labs & imaging results that were available during my care of the patient were reviewed by me and considered in my medical decision making (see MDM for details).  Medications Ordered in ED Medications - No data to display  Procedures Procedures  (including critical care time)  Medical Decision Making / ED Course   MDM:  63 year old presents to the emergency department chest pain.  Patient is overall well-appearing, his EKG is reassuring without acute change.  His examination is overall reassuring without focal abnormality other than small right upper chest reproducible chest pain on palpation.  Suspect symptoms most likely musculoskeletal given reproducible chest pain, pain worse with movement.  Considered other cause such as ACS, ECG is reassuring, troponin negative with over a week of symptoms.  Chest x-ray clear without evidence of pneumonia, pneumothorax.  Doubt other process such as dissection, equal pulses, no back pain, chest x-ray with stable cardiomediastinal silhouette.  Doubt other process such as PE, no tachycardia, hypoxia, pleuritic pain.  Overall concern for dangerous process is low, recommend outpatient follow-up with cardiology.  Have placed a referral to expedite this.  He has an appointment at the end of this month but will see if we can get him in sooner.  Discussed return precautions. Will discharge patient to home. All questions answered. Patient comfortable with plan of discharge. Return precautions discussed with patient and specified on the after visit summary.       Additional history obtained: -Additional history obtained from spouse -External records from  outside source obtained and reviewed including: Chart review including previous notes, labs, imaging, consultation notes including prior cardiology notes/stress test   Lab Tests: -I ordered, reviewed, and interpreted labs.   The pertinent results include:   Labs Reviewed  CBC - Abnormal; Notable for the following components:      Result Value   WBC 3.4 (*)    All other components within normal limits  BASIC METABOLIC PANEL WITH GFR  TROPONIN T, HIGH SENSITIVITY    Notable for nonspecific mild leukopenia, normal troponin  EKG   EKG Interpretation Date/Time:  Monday September 06 2024 14:45:56 EST Ventricular Rate:  70 PR Interval:  140 QRS Duration:  88 QT Interval:  406 QTC Calculation: 439 R Axis:   -25  Text Interpretation: Sinus rhythm Borderline left axis deviation Confirmed by Francesca Fallow ([id number]) on 09/06/2024 5:04:29 PM         Imaging Studies ordered: I ordered imaging studies including CXR On my interpretation imaging demonstrates no acute process I independently visualized and interpreted imaging. I agree with the radiologist interpretation   Medicines ordered and prescription drug management: No orders of the defined types were placed in this encounter.   -I have reviewed the patients home medicines and have made adjustments as needed  Co morbidities that complicate the patient evaluation  Past Medical History:  Diagnosis Date   Arrhythmia    Atrial fibrillation (HCC)    Cataract    OD   Chronic fatigue syndrome 07/15/2024   Chronic pain syndrome 07/15/2024   Complex regional pain syndrome type 2 of lower extremity 07/15/2024   Cubital tunnel syndrome on left 08/26/2017   Degeneration of lumbar intervertebral disc 07/15/2024   Diverticulosis    Fatty liver 06/20/2013   Hyperplastic colon polyp    Hypertension    Internal hemorrhoids    Knee pain 07/15/2024   Lesion of ulnar nerve 07/15/2024   Leukopenia    Lumbosacral spondylosis without  myelopathy 07/15/2024   Neck pain    Opioid dependence (HCC) 07/15/2024   Paresthesia 02/16/2014   Prostate enlargement    Sciatica 07/15/2024   Smoking history 04/16/2024   Spinal stenosis       Dispostion: Disposition decision including  need for hospitalization was considered, and patient discharged from emergency department.    Final Clinical Impression(s) / ED Diagnoses Final diagnoses:  Atypical chest pain     This chart was dictated using voice recognition software.  Despite best efforts to proofread,  errors can occur which can change the documentation meaning.    Francesca Elsie CROME, MD 09/06/24 [phone]

## 2024-09-06 NOTE — ED Triage Notes (Signed)
 PT ambulatory to triage with complaints of chest pain that began over 1 week ago. PT states that he thought it was acid reflux, and started taking Pepcid  but sx have continued. Pt denies shortness of breath. Denies new edema.

## 2024-09-06 NOTE — Discharge Instructions (Signed)
 We evaluated you for your chest pain.  Your testing in the emergency department as well as a physical exam were reassuring.  We suspect your pain is most likely related to musculoskeletal pain (pain from your muscles or chest wall).  Please continue taking your home naproxen .  You can also take 1000 mg of Tylenol  every 6 hours as needed for pain.  You can also apply ice to your chest.  We do not think that your pain is heart related, but given your age and other medical problems we have placed a referral for cardiology.  Please call if you do not hear back in the next 48 to 72 hours  If you have any new or worsening symptoms such as worsening pain, difficulty breathing, vomiting or nausea, your pain gets worse with exercise, or you have any other new symptoms, please return to the emergency department

## 2024-09-08 ENCOUNTER — Emergency Department (HOSPITAL_COMMUNITY): Admission: EM | Admit: 2024-09-08 | Discharge: 2024-09-08 | Disposition: A | Discharge status: Home / Self Care

## 2024-09-08 ENCOUNTER — Encounter (HOSPITAL_COMMUNITY): Payer: Self-pay | Admitting: Emergency Medicine

## 2024-09-08 ENCOUNTER — Other Ambulatory Visit: Payer: Self-pay

## 2024-09-08 ENCOUNTER — Emergency Department (HOSPITAL_COMMUNITY)

## 2024-09-08 DIAGNOSIS — Z79899 Other long term (current) drug therapy: Secondary | ICD-10-CM | POA: Insufficient documentation

## 2024-09-08 DIAGNOSIS — R079 Chest pain, unspecified: Secondary | ICD-10-CM | POA: Insufficient documentation

## 2024-09-08 DIAGNOSIS — I10 Essential (primary) hypertension: Secondary | ICD-10-CM | POA: Insufficient documentation

## 2024-09-08 LAB — BASIC METABOLIC PANEL WITH GFR
Anion gap: 10 (ref 5–15)
BUN: 7 mg/dL — ABNORMAL LOW (ref 8–23)
CO2: 25 mmol/L (ref 22–32)
Calcium: 8.8 mg/dL — ABNORMAL LOW (ref 8.9–10.3)
Chloride: 105 mmol/L (ref 98–111)
Creatinine, Ser: 0.83 mg/dL (ref 0.61–1.24)
GFR, Estimated: >60 mL/min (ref >60)
Glucose, Bld: 133 mg/dL — ABNORMAL HIGH (ref 70–99)
Potassium: 3.6 mmol/L (ref 3.5–5.1)
Sodium: 140 mmol/L (ref 135–145)

## 2024-09-08 LAB — CBC
HCT: 43.3 % (ref 39.0–52.0)
Hemoglobin: 14.9 g/dL (ref 13.0–17.0)
MCH: 32.6 pg (ref 26.0–34.0)
MCHC: 34.4 g/dL (ref 30.0–36.0)
MCV: 94.7 fL (ref 80.0–100.0)
Platelets: 299 K/uL (ref 150–400)
RBC: 4.57 MIL/uL (ref 4.22–5.81)
RDW: 12.4 % (ref 11.5–15.5)
WBC: 4.6 K/uL (ref 4.0–10.5)
nRBC: 0 % (ref 0.0–0.2)

## 2024-09-08 LAB — TROPONIN I (HIGH SENSITIVITY)
Troponin I (High Sensitivity): 4 ng/L (ref <18)
Troponin I (High Sensitivity): 5 ng/L (ref <18)

## 2024-09-08 MED ORDER — IBUPROFEN 400 MG PO TABS
600.0000 mg | ORAL_TABLET | Freq: Once | ORAL | Status: AC
  Administered 2024-09-08: 600 mg via ORAL
  Filled 2024-09-08: qty 1

## 2024-09-08 MED ORDER — ALUM & MAG HYDROXIDE-SIMETH 200-200-20 MG/5ML PO SUSP
30.0000 mL | Freq: Once | ORAL | Status: AC
  Administered 2024-09-08: 30 mL via ORAL
  Filled 2024-09-08: qty 30

## 2024-09-08 MED ORDER — FAMOTIDINE 20 MG PO TABS
20.0000 mg | ORAL_TABLET | Freq: Once | ORAL | Status: AC
  Administered 2024-09-08: 20 mg via ORAL
  Filled 2024-09-08: qty 1

## 2024-09-08 MED ORDER — OMEPRAZOLE 20 MG PO CPDR: 20.0000 mg | DELAYED_RELEASE_CAPSULE | Freq: Every day | ORAL | 0 refills | Status: AC

## 2024-09-08 NOTE — Discharge Instructions (Addendum)
 Evaluation of your chest pain was overall reassuring.  This could be reflux.  I am sending omeprazole to your pharmacy for treatment.  Please follow-up with your PCP.  If your chest pain worsens or returns, you develop shortness of breath, calf tenderness or swelling, chest pain with back pain or any other concerning symptom please return to ED for further evaluation.

## 2024-09-08 NOTE — ED Provider Notes (Signed)
 [place] EMERGENCY DEPARTMENT AT [facility] Provider Note   CSN: [id number] Arrival date & time: 09/08/24  9742     Patient presents with: Chest Pain  HPI Gene Anderson is a 63 y.o. male with hypertension, atrial fibrillation, chronic pain presenting for chest pain.  He states that been going on for 2 to 3 weeks.  Pain is in the center of his chest and radiates to his shoulder, does not radiate to his back.  Denies associated shortness of breath at this time.  Can be worse with movement.  Also mention that he was feeling nauseous and dry heaves but no vomiting.  Denies abdominal pain at this time.  Denies fever.  Was seen for similar symptoms at [facility] 2 days ago with reassuring workup.    Chest Pain      Prior to Admission medications   Medication Sig Start Date End Date Taking? Authorizing Provider  omeprazole (PRILOSEC) 20 MG capsule Take 1 capsule (20 mg total) by mouth daily. 09/08/24  Yes Isahi Godwin K, PA-C  amLODipine (NORVASC) 10 MG tablet Take 10 mg by mouth daily.    [provider]  celecoxib  (CELEBREX ) 200 MG capsule Take by mouth 2 (two) times daily. 11/07/20   [provider]  diclofenac  sodium (VOLTAREN ) 1 % GEL APPLY 2 GRAMS EXTERNALLY TO THE AFFECTED AREA FOUR TIMES DAILY AS NEEDED FOR PAIN 02/08/19   [name], [place] E, MD  naphazoline-pheniramine (NAPHCON-A) 0.025-0.3 % ophthalmic solution Place 1 drop into both eyes 2 (two) times daily as needed for irritation or allergies (USES IN THE MORNING).     [provider]  Oxycodone  HCl 20 MG TABS Take 20 m by mouth every 6 (six) hours. 05/25/18   [provider]  pregabalin (LYRICA) 25 MG capsule TAKE ONE CAPSULE BY MOUTH THREE TIMES DAILY if tolerated. NO NEURONTIN  NO GABAPENTIN ] 10/17/20   [provider]  rosuvastatin  (CRESTOR ) 20 MG tablet Take 1 tablet (20 mg total) by mouth daily. 10/04/19 09/01/24  Barbaraann Darryle Ned, MD  terbinafine  (LAMISIL ) 250 MG  tablet Take 1 tablet (250 mg total) by mouth daily. 03/16/20   Regal, [place] RAMAN, DPM  TYRVAYA 0.03 MG/ACT SOLN Place 1 spray into both nostrils 2 (two) times daily. 03/31/24   [provider]  flecainide  (TAMBOCOR ) 100 MG tablet Take 1 tablet (100 mg total) by mouth daily as needed. for palpitations Patient taking differently: Take 100 mg by mouth daily as needed (palpitations).  04/04/16 07/08/19  Waddell Danelle ORN, MD  gabapentin  (NEURONTIN ) 300 MG capsule Take at night time for 3 days then at night and in the AM for 3 days  And then TID. Patient taking differently: Take 300 mg by mouth 3 (three) times daily.  12/09/17 07/08/19  [name], [place] E, MD  levocetirizine (XYZAL ) 5 MG tablet Take 1 tablet (5 mg total) by mouth every evening. 10/05/17 07/08/19  Gretta Ozell CROME, PA-C  metoprolol  (LOPRESSOR ) 50 MG tablet Take 1 tablet (50 mg total) by mouth daily as needed. for palpitations Patient taking differently: Take 50 mg by mouth daily as needed (palpitations).  04/04/16 07/08/19  Waddell Danelle ORN, MD    Allergies: Amoxicillin  and Augmentin  [amoxicillin -pot clavulanate]    Review of Systems  Cardiovascular:  Positive for chest pain.    Updated Vital Signs BP 122/83 (BP Location: Right Arm)   Pulse 86   Temp 98 F (36.7 C)   Resp 18   Ht 6' 1 (1.854 m)  Wt 99 kg   SpO2 96%   BMI 28.80 kg/m   Physical Exam Vitals and nursing note reviewed.  HENT:     Head: Normocephalic and atraumatic.     Mouth/Throat:     Mouth: Mucous membranes are moist.  Eyes:     General:        Right eye: No discharge.        Left eye: No discharge.     Conjunctiva/sclera: Conjunctivae normal.  Cardiovascular:     Rate and Rhythm: Normal rate and regular rhythm.     Pulses: Normal pulses.          Radial pulses are 2+ on the right side and 2+ on the left side.     Heart sounds: Normal heart sounds.  Pulmonary:     Effort: Pulmonary effort is normal.     Breath sounds: Normal breath sounds.  Chest:      Comments: Tenderness of the right chest elicited with rotation and extension of the right arm. Abdominal:     General: Abdomen is flat.     Palpations: Abdomen is soft.     Tenderness: There is no abdominal tenderness.  Skin:    General: Skin is warm and dry.  Neurological:     General: No focal deficit present.  Psychiatric:        Mood and Affect: Mood normal.     (all labs ordered are listed, but only abnormal results are displayed) Labs Reviewed  BASIC METABOLIC PANEL WITH GFR - Abnormal; Notable for the following components:      Result Value   Glucose, Bld 133 (*)    BUN 7 (*)    Calcium  8.8 (*)    All other components within normal limits  CBC  TROPONIN I (HIGH SENSITIVITY)  TROPONIN I (HIGH SENSITIVITY)    EKG: None  Radiology: DG Chest 2 View Result Date: 09/08/2024 CLINICAL DATA:  Chest pain EXAM: CHEST - 2 VIEW COMPARISON:  09/06/2024 FINDINGS: The heart size and mediastinal contours are within normal limits. Both lungs are clear. The visualized skeletal structures are unremarkable. IMPRESSION: No active cardiopulmonary disease. Electronically Signed   By: Camellia Candle M.D.   On: 09/08/2024 06:31   DG Chest 2 View Result Date: 09/06/2024 CLINICAL DATA:  Chest pain for 1 week EXAM: CHEST - 2 VIEW COMPARISON:  06/28/2021 FINDINGS: Frontal and lateral views of the chest demonstrate an unremarkable cardiac silhouette. No acute airspace disease, effusion, or pneumothorax. No acute bony abnormalities. IMPRESSION: 1. No acute intrathoracic process. Electronically Signed   By: Ozell Daring M.D.   On: 09/06/2024 15:27     Procedures   Medications Ordered in the ED  alum & mag hydroxide-simeth (MAALOX/MYLANTA) 200-200-20 MG/5ML suspension 30 mL (has no administration in time range)  famotidine  (PEPCID ) tablet 20 mg (has no administration in time range)  ibuprofen  (ADVIL ) tablet 600 mg (has no administration in time range)                                     Medical Decision Making Amount and/or Complexity of Data Reviewed Labs: ordered. Radiology: ordered.   Initial Impression and Ddx 63 year old well-appearing male presenting for chest pain.  Exam was unremarkable but elicited reproducible chest wall tenderness.  DDx includes PE, ACS, aortic dissection, reflux, acute pancreatitis or acute cholecystitis, other. Patient PMH that increases complexity of ED encounter:  hypertension,  atrial fibrillation, chronic pain  Interpretation of Diagnostics - I independent reviewed and interpreted the labs as followed: unremarkable, negative troponins  - I independently visualized the following imaging with scope of interpretation limited to determining acute life threatening conditions related to emergency care: CXR, which revealed no acute findings  - I have personally reviewed and interpreted EKG which revealed NSR  Reviewed workup from [facility] 2 days ago which was largely unremarkable.  Patient Reassessment and Ultimate Disposition/Management Doubt PE given no tachycardia or hypoxia and nonpleuritic pain.  Doubt dissection given symmetric pulses, no back pain and no widening of the mediastinum on x-ray.  ACS also unlikely given normal EKG and negative troponins and atypical chest pain.  Suspect this is likely MSK etiology versus reflux.  Since omeprazole to his pharmacy.  Treated with GI cocktail and ibuprofen  here.  Advised to follow-up with his PCP.  Discussed return precautions.  Discharged good condition.  Patient management required discussion with the following services or consulting groups:  None  Complexity of Problems Addressed Acute complicated illness or Injury  Additional Data Reviewed and Analyzed Further history obtained from: Past medical history and medications listed in the EMR and Prior ED visit notes  Patient Encounter Risk Assessment Consideration of hospitalization      Final diagnoses:  Chest pain, unspecified  type    ED Discharge Orders          Ordered    omeprazole (PRILOSEC) 20 MG capsule  Daily        09/08/24 0757               Lang Norleen POUR, PA-C 09/08/24 0758    Simon Lavonia SAILOR, MD 09/08/24 [phone]

## 2024-09-08 NOTE — ED Triage Notes (Signed)
 Patient here for evaluation of centralized chest pain. Points lower to middle and states it radiates to R shoulder. States it feels like he has been punched. Is feeling nauseated with this. Was seen at [facility] on 12/8 w/ good work up and discharged.

## 2024-09-10 ENCOUNTER — Telehealth: Payer: Self-pay

## 2024-09-10 NOTE — Telephone Encounter (Signed)
 Spoke with patient. He stated that he had been seen in the ER 2 times this week with chest pain and all of his tests have been normal. He stated that he is still having sharp chest pains and wants to be seen as soon as possible. Sent info to front desk to call pt to make appt with Dr. Liborio.

## 2024-09-10 NOTE — Telephone Encounter (Signed)
 Pt c/o of Chest Pain: STAT if active (IN THIS MOMENT) CP, including tightness, pressure, jaw pain, shoulder/upper arm/back pain, SOB, nausea, and vomiting.  1. Are you having CP right now (tightness, pressure, or discomfort)? Yes   2. Are you experiencing any other symptoms (ex. SOB, nausea, vomiting, sweating)? Nauseated   3. How long have you been experiencing CP? All week, pt has been to the ED twice this week   4. Is your CP continuous or coming and going? Continuous   5. Have you taken Nitroglycerin? No; he has not been prescribed any. They gave me omeprazole and ibuprofen    6. If CP returns before callback, please consider calling 911. ?

## 2024-09-13 NOTE — Telephone Encounter (Signed)
 See additional note where Gene Anderson spoke with the pt.

## 2024-09-14 ENCOUNTER — Ambulatory Visit

## 2024-09-14 VITALS — BP 130/80 | HR 84 | Ht 74.0 in | Wt 219.0 lb

## 2024-09-14 DIAGNOSIS — R079 Chest pain, unspecified: Secondary | ICD-10-CM | POA: Insufficient documentation

## 2024-09-14 DIAGNOSIS — I10 Essential (primary) hypertension: Secondary | ICD-10-CM

## 2024-09-14 DIAGNOSIS — I493 Ventricular premature depolarization: Secondary | ICD-10-CM | POA: Insufficient documentation

## 2024-09-14 DIAGNOSIS — I480 Paroxysmal atrial fibrillation: Secondary | ICD-10-CM | POA: Diagnosis present

## 2024-09-14 NOTE — Patient Instructions (Signed)
Medication Instructions:  Your physician recommends that you continue on your current medications as directed. Please refer to the Current Medication list given to you today.  *If you need a refill on your cardiac medications before your next appointment, please call your pharmacy*   Lab Work: None ordered If you have labs (blood work) drawn today and your tests are completely normal, you will receive your results only by: MyChart Message (if you have MyChart) OR A paper copy in the mail If you have any lab test that is abnormal or we need to change your treatment, we will call you to review the results.   Testing/Procedures: None ordered   Follow-Up: At [facility], you and your health needs are our priority.  As part of our continuing mission to provide you with exceptional heart care, we have created designated Provider Care Teams.  These Care Teams include your primary Cardiologist (physician) and Advanced Practice Providers (APPs -  Physician Assistants and Nurse Practitioners) who all work together to provide you with the care you need, when you need it.  We recommend signing up for the patient portal called "MyChart".  Sign up information is provided on this After Visit Summary.  MyChart is used to connect with patients for Virtual Visits (Telemedicine).  Patients are able to view lab/test results, encounter notes, upcoming appointments, etc.  Non-urgent messages can be sent to your provider as well.   To learn more about what you can do with MyChart, go to [email or web address].    Your next appointment:   3 month(s)  The format for your next appointment:   In Person  Provider:   Huntley Dec, MD    Other Instructions none  Important Information About Sugar

## 2024-09-14 NOTE — Assessment & Plan Note (Signed)
 Paroxysmal A-fib has remained in sinus rhythm however burden of A-fib on Zio patch monitor.  Low CHA2DS2-VASc score. Not on anticoagulant.

## 2024-09-14 NOTE — Assessment & Plan Note (Signed)
 5% burden on Zio patch July 2025, asymptomatic. Will review echocardiogram results scheduled for December 23 once completed and available.

## 2024-09-14 NOTE — Assessment & Plan Note (Signed)
 Chest pain appears noncardiac in description. He does have cardiovascular risk factors however the nature of his symptoms do not appear cardiac.  Recommended strongly to have further evaluation by PCP for noncardiac causes of his chest pain. High suspicion for gastritis versus gallbladder or liver related symptoms in the setting of heavy alcohol consumption.  Will review results from echocardiogram which is currently scheduled for December 23 Advised him if symptoms are acutely worsening to head to the ER or call 911.

## 2024-09-14 NOTE — Progress Notes (Signed)
 Cardiology Consultation:    Date:  09/14/2024   ID:  Gene Anderson, DOB Feb 14, 1961, MRN [id number]  PCP:  Shelda Atlas, MD  Cardiologist:  Alean SAUNDERS Deandrea Rion, MD   Referring MD: Shelda Atlas, MD   No chief complaint on file.    ASSESSMENT AND PLAN:   Gene Anderson 63 year old male history of atrial fibrillation initially diagnosed in 2015 s/p TEE cardioversion and was initially not on anticoagulation.  No further recurrence of A-fib on heart monitor in November 2021 and recent Zio patch July 2025. Has frequent PVC burden 5% on Zio patch July 2025, OSA [not adherent with CPAP], hyperlipidemia, hypertension, tobacco use [less than 1 pack a day], drinks alcohol regularly up to 6 beers a day. Last echocardiogram and stress test with nuclear imaging were back in January 2021.   Here for earlier follow-up given symptoms of chest pain prompting 2 ER visits in the past 2 weeks. Symptoms appear noncardiac in description.  Problem List Items Addressed This Visit       Cardiovascular and Mediastinum   Atrial fibrillation (HCC)   Paroxysmal A-fib has remained in sinus rhythm however burden of A-fib on Zio patch monitor.  Low CHA2DS2-VASc score. Not on anticoagulant.      Hypertension   Well-controlled. Continue amlodipine 10 mg once daily.      Frequent PVCs   5% burden on Zio patch July 2025, asymptomatic. Will review echocardiogram results scheduled for December 23 once completed and available.        Other   Chest pain of uncertain etiology - Primary   Chest pain appears noncardiac in description. He does have cardiovascular risk factors however the nature of his symptoms do not appear cardiac.  Recommended strongly to have further evaluation by PCP for noncardiac causes of his chest pain. High suspicion for gastritis versus gallbladder or liver related symptoms in the setting of heavy alcohol consumption.  Will review results from echocardiogram which is currently  scheduled for December 23 Advised him if symptoms are acutely worsening to head to the ER or call 911.        Return to clinic in 3 months.  Earlier follow-up as needed   History of Present Illness:    Gene Anderson is a 63 y.o. male who is being seen today for follow-up visit. PCP is Shelda Atlas, MD. Last visit with me in the office was 04/16/2024. Here for an earlier follow-up visit given symptoms of chest pain.  Here for the visit by himself.  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  Lives with his wife at home.  Has history of atrial fibrillation initially diagnosed in 2015 s/p TEE cardioversion and was initially not on anticoagulation.  No further recurrence of A-fib on heart monitor in November 2021 and recent Zio patch July 2025. Has frequent PVC burden 5% on Zio patch July 2025, OSA [not adherent with CPAP], hyperlipidemia, hypertension, tobacco use [less than 1 pack a day], drinks alcohol regularly up to 6 beers a day. Last echocardiogram and stress test with nuclear imaging were back in January 2021.  Was evaluated in the ER couple times this month on December 8 and again on December 10 for chest pain.  Was in sinus rhythm on both occasions.  Recently 09/08/2024 was hemodynamically stable.  High-sensitivity troponins were unremarkable.  Pain felt to be musculoskeletal etiology.  Received ibuprofen  and GI cocktail.  Unremarkable CBC, basic metabolic panel. EKG was done in  the ER recently on 09/06/2024 and 09/08/2024 reviewed shows no significant ischemic changes.  Chest x-ray on both days was unremarkable.  Described that over the past 2 weeks he has been dealing with symptoms of chest pain which he elaborates as upper epigastric to right lower part of the chest discomfort present constantly, had been worse on and off.  Associated with mild nausea and reduced appetite with early satiety.  Denies any shortness of breath, orthopnea, paroxysmal nocturnal  dyspnea. Mentions this has been fairly uncomfortable on a constant basis.  No association with effort. Denies any blood in urine or stools.  He continues to drink alcohol and feels that it helps ease up the pain.  Has been taking his medications consistently. Denies any palpitation, lightheadedness, dizziness or syncopal episodes.   Past Medical History:  Diagnosis Date   Arrhythmia    Atrial fibrillation (HCC)    Cataract    OD   Chronic fatigue syndrome 07/15/2024   Chronic pain syndrome 07/15/2024   Complex regional pain syndrome type 2 of lower extremity 07/15/2024   Cubital tunnel syndrome on left 08/26/2017   Degeneration of lumbar intervertebral disc 07/15/2024   Diverticulosis    Fatty liver 06/20/2013   Hyperplastic colon polyp    Hypertension    Internal hemorrhoids    Knee pain 07/15/2024   Lesion of ulnar nerve 07/15/2024   Leukopenia    Lumbosacral spondylosis without myelopathy 07/15/2024   Neck pain    Opioid dependence (HCC) 07/15/2024   Paresthesia 02/16/2014   Prostate enlargement    Sciatica 07/15/2024   Smoking history 04/16/2024   Spinal stenosis     Past Surgical History:  Procedure Laterality Date   CARDIOVERSION  03/2014   CATARACT EXTRACTION Left 05/14/2019   Dr. Glendia Gaudy   EYE SURGERY     Flexible cystoscopy and repair of torn tunica albuginea     ULNAR NERVE TRANSPOSITION Left 08/26/2017   Procedure: LEFT ELBOW ULNAR NEUROLYSIS;  Surgeon: Lucilla Lynwood BRAVO, MD;  Location: [facility];  Service: Orthopedics;  Laterality: Left;    Current Medications: Active Medications[1]   Allergies:   Amoxicillin  and Augmentin  [amoxicillin -pot clavulanate]   Social History   Socioeconomic History   Marital status: Married    Spouse name: Not on file   Number of children: 4   Years of education: 14   Highest education level: Not on file  Occupational History    Employer: [employer]  Tobacco Use   Smoking status: Every  Day    Current packs/day: 0.75    Average packs/day: 0.8 packs/day for 40.0 years (30.0 ttl pk-yrs)    Types: Cigarettes   Smokeless tobacco: Never  Vaping Use   Vaping status: Never Used  Substance and Sexual Activity   Alcohol use: Yes    Comment: social   Drug use: No   Sexual activity: Not on file  Other Topics Concern   Not on file  Social History Narrative   Patient lives at home with his girl friend.   Patient is not working at this time.   Right handed.   Education college education.   Caffeine None   Social Drivers of Health   Tobacco Use: High Risk (09/14/2024)   Patient History    Smoking Tobacco Use: Every Day    Smokeless Tobacco Use: Never    Passive Exposure: Not on file  Financial Resource Strain: Not on file  Food Insecurity: Not on file  Transportation Needs:  Not on file  Physical Activity: Not on file  Stress: Not on file  Social Connections: Unknown (02/08/2022)   [facility]   Social Network    Social Network: Not on file  Depression (PHQ2-9): Not on file  Alcohol Screen: Not on file  Housing: Not on file  Utilities: Not on file  Health Literacy: Not on file     Family History: The patient's family history includes Cancer in his father and paternal grandmother; Hypertension in his mother; Stroke in his maternal grandfather. ROS:   Please see the history of present illness.    All 14 point review of systems negative except as described per history of present illness.  EKGs/Labs/Other Studies Reviewed:    The following studies were reviewed today:   EKG:       Recent Labs: 09/08/2024: BUN 7; Creatinine, Ser 0.83; Hemoglobin 14.9; Platelets 299; Potassium 3.6; Sodium 140  Recent Lipid Panel    Component Value Date/Time   CHOL 218 (H) 08/06/2022 1232   TRIG 80 08/06/2022 1232   HDL 69 08/06/2022 1232   CHOLHDL 3.2 08/06/2022 1232   LDLCALC 132 (H) 08/06/2022 1232    Physical Exam:    VS:  BP 130/80   Pulse 84    Ht 6' 2 (1.88 m)   Wt 219 lb (99.3 kg)   SpO2 96%   BMI 28.12 kg/m     Wt Readings from Last 3 Encounters:  09/14/24 219 lb (99.3 kg)  09/08/24 218 lb 4.1 oz (99 kg)  04/16/24 219 lb (99.3 kg)     GENERAL:  Well nourished, well developed in no acute distress NECK: No JVD; No carotid bruits CARDIAC: RRR, S1 and S2 present, no murmurs, no rubs, no gallops Abdomen: Soft, nontender. CHEST:  Clear to auscultation without rales, wheezing or rhonchi  Extremities: No pitting pedal edema. Pulses bilaterally symmetric with radial 2+ and dorsalis pedis 2+ NEUROLOGIC:  Alert and oriented x 3  Medication Adjustments/Labs and Tests Ordered: Current medicines are reviewed at length with the patient today.  Concerns regarding medicines are outlined above.  No orders of the defined types were placed in this encounter.  No orders of the defined types were placed in this encounter.   Signed, Derrill Bagnell reddy Jadon Ressler, MD, MPH, [facility]. 09/14/2024 3:56 PM    [facility]    [1]  Current Meds  Medication Sig   amLODipine (NORVASC) 10 MG tablet Take 10 mg by mouth daily.   diclofenac  sodium (VOLTAREN ) 1 % GEL APPLY 2 GRAMS EXTERNALLY TO THE AFFECTED AREA FOUR TIMES DAILY AS NEEDED FOR PAIN   naphazoline-pheniramine (NAPHCON-A) 0.025-0.3 % ophthalmic solution Place 1 drop into both eyes 2 (two) times daily as needed for irritation or allergies (USES IN THE MORNING).    omeprazole  (PRILOSEC) 20 MG capsule Take 1 capsule (20 mg total) by mouth daily.   Oxycodone  HCl 20 MG TABS Take 20 m by mouth every 6 (six) hours.   Vitamin D , Ergocalciferol , (DRISDOL) 1.25 MG (50000 UNIT) CAPS capsule Take 50,000 Units by mouth once a week.   Current Facility-Administered Medications for the 09/14/24 encounter (Office Visit) with Kristalynn Coddington, Alean SAUNDERS, MD  Medication   eszopiclone  (LUNESTA ) tablet 2 mg

## 2024-09-14 NOTE — Assessment & Plan Note (Signed)
Well-controlled.  Continue amlodipine 10 mg once daily. 

## 2024-09-21 ENCOUNTER — Ambulatory Visit (HOSPITAL_COMMUNITY)
Admission: RE | Admit: 2024-09-21 | Discharge: 2024-09-21 | Disposition: A | Discharge status: Home / Self Care | Source: Ambulatory Visit | Attending: Internal Medicine | Admitting: Internal Medicine

## 2024-09-21 DIAGNOSIS — I10 Essential (primary) hypertension: Secondary | ICD-10-CM | POA: Diagnosis not present

## 2024-09-21 DIAGNOSIS — I493 Ventricular premature depolarization: Secondary | ICD-10-CM | POA: Insufficient documentation

## 2024-09-21 DIAGNOSIS — I4891 Unspecified atrial fibrillation: Secondary | ICD-10-CM

## 2024-09-22 LAB — ECHOCARDIOGRAM COMPLETE
Area-P 1/2: 3.85 cm2
S' Lateral: 3.3 cm

## 2024-09-28 ENCOUNTER — Ambulatory Visit (HOSPITAL_COMMUNITY)

## 2024-10-03 NOTE — Progress Notes (Unsigned)
 " Cardiology Office Note:    Date:  10/04/2024   ID:  Gene Anderson, DOB March 13, 1961, MRN [id number]  PCP:  Shelda Atlas, MD   [facility] Providers Cardiologist:  Alean SAUNDERS Madireddy, MD     Referring MD: Shelda Atlas, MD   Chief complaint: Follow-up chest pain     History of Present Illness:   Gene Anderson is a 64 y.o. 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  ZIO monitor ordered in July due to palpitations.  2 short runs of SVT, longest lasting 12.3 seconds, asymptomatic.  Resulted November 2025 showing frequent PVCs of 5.4% burden, which prompted ordering an echo to be performed in December.  Primary cardiologist recommended follow-up with PCP for further sleep apnea treatment optimization.  Presented to the ED 09/06/2024 c/o chest pain X 1 week, right side of chest, worse with moving arm.  EKG NSR, 70 bpm, left axis deviation, minimal ST elevation in anterior leads.  CXR negative for acute process, BMP unremarkable, CBC unremarkable, negative troponin.  Discharged home with plans for follow-up with cardiology.  Presented to the ED again 09/08/2024 with complaints of continuing chest pain.  BMP, CBC, troponin, CXR all negative for acute changes.  EKG NSR.  Treated with GI cocktail, ibuprofen , send omeprazole  to pharmacy, advised to follow-up with PCP and discharged.  Most recently seen in the office by Dr. Liborio, chest pain believed to be noncardiac in description.  Echo 09/21/2024: LVEF 60-65%, normal LV function, no RWMA, normal diastolic parameters, normal RV, trivial MV regurg, normal AV, borderline dilatation of ascending aorta measuring 38 mm.  Presents  independently, appears stable from a cardiovascular standpoint.  Patient reports continued right-sided chest pain extending from right lower sternal border wrapping around his right chest and extending across the musculature of his anterior chest wall on the right side.  Constantly there, waxes and wanes in severity, currently is 6/10, states was a 9/10 when it first occurred 3 weeks prior.  Pain has improved over the last 3 weeks, most notably after use of NSAIDs, omeprazole , and prescribed pain medication.  There was nausea initially, none since.  Unsure of whether pain worsens with exertion as he has not been physically exerting himself due to concern for worsening the pain.  Pain aggravated by arm movements and deep inspiration.  Improved with chest wall massage.  He denies shortness of breath, palpitations, dizziness, lightheadedness, near-syncope, orthopnea, dark/tarry/bloody stools, hematuria, weight changes, edema.  ROS:   Please see the history of present illness.    All other systems reviewed and are negative.     Past Medical History:  Diagnosis Date   Arrhythmia    Atrial fibrillation (HCC)    Cataract    OD   Chronic fatigue syndrome 07/15/2024   Chronic pain syndrome 07/15/2024   Complex regional pain syndrome type 2 of lower extremity 07/15/2024   Cubital tunnel syndrome on left 08/26/2017   Degeneration of lumbar intervertebral disc 07/15/2024   Diverticulosis    Fatty liver 06/20/2013   Hyperplastic colon polyp    Hypertension    Internal hemorrhoids    Knee pain 07/15/2024   Lesion of ulnar nerve 07/15/2024   Leukopenia    Lumbosacral spondylosis without myelopathy 07/15/2024  Neck pain    Opioid dependence (HCC) 07/15/2024   Paresthesia 02/16/2014   Prostate enlargement    Sciatica 07/15/2024   Smoking history 04/16/2024   Spinal stenosis     Past Surgical History:  Procedure Laterality Date   CARDIOVERSION  03/2014   CATARACT EXTRACTION Left 05/14/2019    Dr. Glendia Gaudy   EYE SURGERY     Flexible cystoscopy and repair of torn tunica albuginea     ULNAR NERVE TRANSPOSITION Left 08/26/2017   Procedure: LEFT ELBOW ULNAR NEUROLYSIS;  Surgeon: Lucilla Lynwood BRAVO, MD;  Location: [facility];  Service: Orthopedics;  Laterality: Left;    Current Medications: Active Medications[1]   Allergies:   Amoxicillin  and Augmentin  [amoxicillin -pot clavulanate]   Social History   Socioeconomic History   Marital status: Married    Spouse name: Not on file   Number of children: 4   Years of education: 14   Highest education level: Not on file  Occupational History    Employer: [employer]  Tobacco Use   Smoking status: Every Day    Current packs/day: 0.75    Average packs/day: 0.8 packs/day for 40.0 years (30.0 ttl pk-yrs)    Types: Cigarettes   Smokeless tobacco: Never  Vaping Use   Vaping status: Never Used  Substance and Sexual Activity   Alcohol use: Yes    Comment: social   Drug use: No   Sexual activity: Not on file  Other Topics Concern   Not on file  Social History Narrative   Patient lives at home with his girl friend.   Patient is not working at this time.   Right handed.   Education college education.   Caffeine None   Social Drivers of Health   Tobacco Use: High Risk (10/04/2024)   Patient History    Smoking Tobacco Use: Every Day    Smokeless Tobacco Use: Never    Passive Exposure: Not on file  Financial Resource Strain: Not on file  Food Insecurity: Not on file  Transportation Needs: Not on file  Physical Activity: Not on file  Stress: Not on file  Social Connections: Unknown (02/08/2022)   Received from [facility]   Social Network    Social Network: Not on file  Depression (PHQ2-9): Not on file  Alcohol Screen: Not on file  Housing: Not on file  Utilities: Not on file  Health Literacy: Not on file     Family History: The patient's family history includes Cancer in his father and  paternal grandmother; Hypertension in his mother; Stroke in his maternal grandfather.  EKGs/Labs/Other Studies Reviewed:    The following studies were reviewed today:  EKG Interpretation Date/Time:  Monday October 04 2024 12:07:35 EST Ventricular Rate:  83 PR Interval:  136 QRS Duration:  84 QT Interval:  378 QTC Calculation: 444 R Axis:   -26  Text Interpretation: Normal sinus rhythm with sinus arrhythmia Normal ECG When compared with ECG of 08-Sep-2024 03:35, No significant change was found Confirmed by Nateisha Moyd [phone]) on 10/04/2024 12:53:01 PM    Recent Labs: 09/08/2024: BUN 7; Creatinine, Ser 0.83; Hemoglobin 14.9; Platelets 299; Potassium 3.6; Sodium 140  Recent Lipid Panel    Component Value Date/Time   CHOL 218 (H) 08/06/2022 1232   TRIG 80 08/06/2022 1232   HDL 69 08/06/2022 1232   CHOLHDL 3.2 08/06/2022 1232   LDLCALC 132 (H) 08/06/2022 1232     Risk Assessment/Calculations:    CHA2DS2-VASc Score = 1  This indicates a 0.6% annual risk of stroke. The patient's score is based upon: CHF History: 0 HTN History: 1 Diabetes History: 0 Stroke History: 0 Vascular Disease History: 0 Age Score: 0 Gender Score: 0                Physical Exam:    VS:  BP 112/78   Pulse 83   Ht 6' 2 (1.88 m)   Wt 219 lb 6.4 oz (99.5 kg)   SpO2 98%   BMI 28.17 kg/m        Wt Readings from Last 3 Encounters:  10/04/24 219 lb 6.4 oz (99.5 kg)  09/14/24 219 lb (99.3 kg)  09/08/24 218 lb 4.1 oz (99 kg)     GEN:  Well nourished, well developed in no acute distress HEENT: Normal NECK: No JVD; No carotid bruits CARDIAC:  S1-S2 normal, RRR, no murmurs, rubs, gallops RESPIRATORY:  Clear to auscultation without rales, wheezing or rhonchi  MUSCULOSKELETAL:  No edema; No deformity, pain worse w/ right arm movement. Improved w/ deep palpation of muscle tissue on front wall of chest. No lumps/bumps palpable over right chest wall.  SKIN: Warm and dry, no visible rash or  change to skin integrity NEUROLOGIC:  Alert and oriented x 3 PSYCHIATRIC:  Normal affect       Assessment & Plan Atypical chest pain Echo 09/21/2024: LVEF 60-65%, normal LV function, no RWMA, normal diastolic parameters, normal RV, trivial MV regurg, normal AV, borderline dilatation of ascending aorta measuring 38 mm. Started 3 weeks ago, right lower sternal border, sharp, wrapping around anterior chest to mid-lateral chest, not fully into back, spreading across anterior chest wall diffusely, worse with deep inspiration and right arm movement, improves with rest and massage of the area. Nausea with initial pain, since resolved. No other associated symptoms. Pain has slowly improved over the last 3 weeks, controlled with omeprazole , NSAIDs and oxycodone . He is unsure whether this worsens w/ activity as he's reduced his physical activity since onset of pain. 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  Negative troponins and unremarkable EKGs at both ED visits in December for episodes of chest pain. WELLS criteria negative for PE, no SOB/DOE/orthopnea It appears to be most likely musculoskeletal in nature given it is reproducible w/ movement/palpation of area, does not appear to be cardiac in nature. Echo results are reassuring given lack of RWMA and normal pumping function. No indication for ischemic evaluation at this time. He is to notify us  of any change in symptomatology. He has an appointment with his PCP this week to follow up on this for further monitoring Advised if symptoms worsen to head to the nearest ED Paroxysmal atrial fibrillation (HCC) Atrial fibrillation requiring cardioversion in 2015 No documented recurrence over the years Rates well controlled Cardiac event monitoring in July 2025 predominantly sinus rhythm, no arrhythmias CHA2DS2-VASc score = 1, not on OAC  Disposition: Follow up in 6 months or sooner if needed. Proceed to the ED with any new  or worsening symptoms.             Medication Adjustments/Labs and Tests Ordered: Current medicines are reviewed at length with the patient today.  Concerns regarding medicines are outlined above.  Orders Placed This Encounter  Procedures   EKG 12-Lead   No orders of the defined types were placed in this encounter.   Patient Instructions  Medication Instructions:  Your physician recommends that you continue on your current  medications as directed. Please refer to the Current Medication list given to you today.  *If you need a refill on your cardiac medications before your next appointment, please call your pharmacy*  Lab Work: NONE If you have labs (blood work) drawn today and your tests are completely normal, you will receive your results only by: MyChart Message (if you have MyChart) OR A paper copy in the mail If you have any lab test that is abnormal or we need to change your treatment, we will call you to review the results.  Testing/Procedures: NONE  Follow-Up: At [facility], you and your health needs are our priority.  As part of our continuing mission to provide you with exceptional heart care, our providers are all part of one team.  This team includes your primary Cardiologist (physician) and Advanced Practice Providers or APPs (Physician Assistants and Nurse Practitioners) who all work together to provide you with the care you need, when you need it.  Your next appointment:   6 month(s)  Provider:   Darryle ONEIDA Decent, MD    We recommend signing up for the patient portal called MyChart.  Sign up information is provided on this After Visit Summary.  MyChart is used to connect with patients for Virtual Visits (Telemedicine).  Patients are able to view lab/test results, encounter notes, upcoming appointments, etc.  Non-urgent messages can be sent to your provider as well.   To learn more about what you can do with MyChart, go to [email or web address].    Other Instructions Please see Primary Care Doctor in person.          Signed, Miriam FORBES Shams, NP  10/04/2024 5:43 PM    [facility]     [1]  Current Meds  Medication Sig   amLODipine (NORVASC) 10 MG tablet Take 10 mg by mouth daily.   diclofenac  sodium (VOLTAREN ) 1 % GEL APPLY 2 GRAMS EXTERNALLY TO THE AFFECTED AREA FOUR TIMES DAILY AS NEEDED FOR PAIN   naphazoline-pheniramine (NAPHCON-A) 0.025-0.3 % ophthalmic solution Place 1 drop into both eyes 2 (two) times daily as needed for irritation or allergies (USES IN THE MORNING).    omeprazole  (PRILOSEC) 20 MG capsule Take 1 capsule (20 mg total) by mouth daily.   oxyCODONE  (ROXICODONE ) 15 MG immediate release tablet Take 15 mg by mouth.   Vitamin D , Ergocalciferol , (DRISDOL) 1.25 MG (50000 UNIT) CAPS capsule Take 50,000 Units by mouth once a week.   Current Facility-Administered Medications for the 10/04/24 encounter (Office Visit) with Kaitrin Seybold E, NP  Medication   eszopiclone  (LUNESTA ) tablet 2 mg   "

## 2024-10-04 ENCOUNTER — Encounter: Payer: Self-pay | Admitting: Emergency Medicine

## 2024-10-04 ENCOUNTER — Ambulatory Visit: Admitting: Emergency Medicine

## 2024-10-04 VITALS — BP 112/78 | HR 83 | Ht 74.0 in | Wt 219.4 lb

## 2024-10-04 DIAGNOSIS — R0789 Other chest pain: Secondary | ICD-10-CM | POA: Diagnosis not present

## 2024-10-04 DIAGNOSIS — I480 Paroxysmal atrial fibrillation: Secondary | ICD-10-CM | POA: Diagnosis not present

## 2024-10-04 NOTE — Assessment & Plan Note (Addendum)
 Atrial fibrillation requiring cardioversion in 2015 No documented recurrence over the years Rates well controlled Cardiac event monitoring in July 2025 predominantly sinus rhythm, no arrhythmias CHA2DS2-VASc score = 1, not on OAC

## 2024-10-04 NOTE — Patient Instructions (Signed)
 Medication Instructions:  Your physician recommends that you continue on your current medications as directed. Please refer to the Current Medication list given to you today.  *If you need a refill on your cardiac medications before your next appointment, please call your pharmacy*  Lab Work: NONE If you have labs (blood work) drawn today and your tests are completely normal, you will receive your results only by: MyChart Message (if you have MyChart) OR A paper copy in the mail If you have any lab test that is abnormal or we need to change your treatment, we will call you to review the results.  Testing/Procedures: NONE  Follow-Up: At [facility], you and your health needs are our priority.  As part of our continuing mission to provide you with exceptional heart care, our providers are all part of one team.  This team includes your primary Cardiologist (physician) and Advanced Practice Providers or APPs (Physician Assistants and Nurse Practitioners) who all work together to provide you with the care you need, when you need it.  Your next appointment:   6 month(s)  Provider:   Darryle ONEIDA Decent, MD    We recommend signing up for the patient portal called MyChart.  Sign up information is provided on this After Visit Summary.  MyChart is used to connect with patients for Virtual Visits (Telemedicine).  Patients are able to view lab/test results, encounter notes, upcoming appointments, etc.  Non-urgent messages can be sent to your provider as well.   To learn more about what you can do with MyChart, go to [email or web address].   Other Instructions Please see Primary Care Doctor in person.

## 2024-10-05 ENCOUNTER — Ambulatory Visit: Payer: Self-pay

## 2024-12-21 ENCOUNTER — Ambulatory Visit
